# Patient Record
Sex: Female | Born: 1940
Health system: Southern US, Community
[De-identification: ages and names within clinical notes are randomized; demographics above are authoritative.]

## PROBLEM LIST (undated history)

## (undated) DIAGNOSIS — I495 Sick sinus syndrome: Secondary | ICD-10-CM

## (undated) DIAGNOSIS — G5139 Clonic hemifacial spasm, unspecified: Secondary | ICD-10-CM

## (undated) DIAGNOSIS — I471 Supraventricular tachycardia: Secondary | ICD-10-CM

## (undated) DIAGNOSIS — F32A Depression, unspecified: Secondary | ICD-10-CM

## (undated) DIAGNOSIS — F329 Major depressive disorder, single episode, unspecified: Secondary | ICD-10-CM

## (undated) DIAGNOSIS — I10 Essential (primary) hypertension: Secondary | ICD-10-CM

## (undated) DIAGNOSIS — F419 Anxiety disorder, unspecified: Secondary | ICD-10-CM

## (undated) DIAGNOSIS — I4719 Other supraventricular tachycardia: Secondary | ICD-10-CM

## (undated) DIAGNOSIS — I4891 Unspecified atrial fibrillation: Secondary | ICD-10-CM

## (undated) HISTORY — PX: CATARACT EXTRACTION: SUR2

## (undated) HISTORY — DX: Major depressive disorder, single episode, unspecified: F32.9

## (undated) HISTORY — PX: TONSILLECTOMY AND ADENOIDECTOMY: SHX28

## (undated) HISTORY — DX: Unspecified atrial fibrillation: I48.91

## (undated) HISTORY — PX: DILATION AND CURETTAGE OF UTERUS: SHX78

## (undated) HISTORY — PX: APPENDECTOMY: SHX54

## (undated) HISTORY — DX: Anxiety disorder, unspecified: F41.9

## (undated) HISTORY — DX: Supraventricular tachycardia: I47.1

## (undated) HISTORY — DX: Essential (primary) hypertension: I10

## (undated) HISTORY — DX: Other supraventricular tachycardia: I47.19

## (undated) HISTORY — DX: Clonic hemifacial spasm, unspecified: G51.39

## (undated) HISTORY — DX: Depression, unspecified: F32.A

## (undated) HISTORY — DX: Sick sinus syndrome: I49.5

---

## 1998-01-04 ENCOUNTER — Other Ambulatory Visit: Admission: RE | Admit: 1998-01-04 | Discharge: 1998-01-04 | Payer: Self-pay | Admitting: Obstetrics and Gynecology

## 1999-02-25 ENCOUNTER — Other Ambulatory Visit: Admission: RE | Admit: 1999-02-25 | Discharge: 1999-02-25 | Payer: Self-pay | Admitting: Obstetrics and Gynecology

## 1999-09-14 ENCOUNTER — Emergency Department (HOSPITAL_COMMUNITY): Admission: EM | Admit: 1999-09-14 | Discharge: 1999-09-14 | Payer: Self-pay | Admitting: Emergency Medicine

## 1999-09-14 ENCOUNTER — Encounter: Payer: Self-pay | Admitting: Emergency Medicine

## 2000-03-25 ENCOUNTER — Other Ambulatory Visit: Admission: RE | Admit: 2000-03-25 | Discharge: 2000-03-25 | Payer: Self-pay | Admitting: Obstetrics and Gynecology

## 2000-04-20 ENCOUNTER — Encounter: Payer: Self-pay | Admitting: Geriatric Medicine

## 2000-04-20 ENCOUNTER — Encounter: Admission: RE | Admit: 2000-04-20 | Discharge: 2000-04-20 | Payer: Self-pay | Admitting: Geriatric Medicine

## 2000-04-22 ENCOUNTER — Encounter: Admission: RE | Admit: 2000-04-22 | Discharge: 2000-04-22 | Payer: Self-pay | Admitting: Geriatric Medicine

## 2000-04-22 ENCOUNTER — Encounter: Payer: Self-pay | Admitting: Geriatric Medicine

## 2001-06-02 ENCOUNTER — Other Ambulatory Visit: Admission: RE | Admit: 2001-06-02 | Discharge: 2001-06-02 | Payer: Self-pay | Admitting: Obstetrics and Gynecology

## 2001-06-17 ENCOUNTER — Encounter: Payer: Self-pay | Admitting: Geriatric Medicine

## 2001-06-17 ENCOUNTER — Encounter: Admission: RE | Admit: 2001-06-17 | Discharge: 2001-06-17 | Payer: Self-pay | Admitting: Geriatric Medicine

## 2002-09-11 ENCOUNTER — Encounter: Payer: Self-pay | Admitting: *Deleted

## 2002-09-11 ENCOUNTER — Inpatient Hospital Stay (HOSPITAL_COMMUNITY): Admission: EM | Admit: 2002-09-11 | Discharge: 2002-09-12 | Payer: Self-pay | Admitting: *Deleted

## 2002-09-12 ENCOUNTER — Encounter (INDEPENDENT_AMBULATORY_CARE_PROVIDER_SITE_OTHER): Payer: Self-pay | Admitting: Cardiology

## 2003-10-04 ENCOUNTER — Encounter: Admission: RE | Admit: 2003-10-04 | Discharge: 2003-10-04 | Payer: Self-pay | Admitting: Geriatric Medicine

## 2004-02-27 ENCOUNTER — Other Ambulatory Visit: Admission: RE | Admit: 2004-02-27 | Discharge: 2004-02-27 | Payer: Self-pay | Admitting: Obstetrics and Gynecology

## 2004-02-28 ENCOUNTER — Encounter: Admission: RE | Admit: 2004-02-28 | Discharge: 2004-02-28 | Payer: Self-pay | Admitting: Obstetrics and Gynecology

## 2004-03-05 ENCOUNTER — Encounter: Admission: RE | Admit: 2004-03-05 | Discharge: 2004-03-05 | Payer: Self-pay | Admitting: Gastroenterology

## 2005-05-08 ENCOUNTER — Encounter: Admission: RE | Admit: 2005-05-08 | Discharge: 2005-05-08 | Payer: Self-pay | Admitting: Geriatric Medicine

## 2006-11-14 ENCOUNTER — Encounter: Admission: RE | Admit: 2006-11-14 | Discharge: 2006-11-14 | Payer: Self-pay | Admitting: Geriatric Medicine

## 2006-11-23 ENCOUNTER — Encounter: Admission: RE | Admit: 2006-11-23 | Discharge: 2006-11-23 | Payer: Self-pay | Admitting: Geriatric Medicine

## 2007-01-06 ENCOUNTER — Ambulatory Visit (HOSPITAL_COMMUNITY): Admission: RE | Admit: 2007-01-06 | Discharge: 2007-01-06 | Payer: Self-pay | Admitting: Geriatric Medicine

## 2007-01-06 ENCOUNTER — Encounter (INDEPENDENT_AMBULATORY_CARE_PROVIDER_SITE_OTHER): Payer: Self-pay | Admitting: Geriatric Medicine

## 2007-01-17 ENCOUNTER — Emergency Department (HOSPITAL_COMMUNITY): Admission: EM | Admit: 2007-01-17 | Discharge: 2007-01-17 | Payer: Self-pay | Admitting: Emergency Medicine

## 2007-01-20 ENCOUNTER — Encounter (HOSPITAL_COMMUNITY): Admission: RE | Admit: 2007-01-20 | Discharge: 2007-04-13 | Payer: Self-pay | Admitting: Emergency Medicine

## 2007-01-24 ENCOUNTER — Emergency Department (HOSPITAL_COMMUNITY): Admission: EM | Admit: 2007-01-24 | Discharge: 2007-01-24 | Payer: Self-pay | Admitting: *Deleted

## 2008-06-13 ENCOUNTER — Other Ambulatory Visit: Admission: RE | Admit: 2008-06-13 | Discharge: 2008-06-13 | Payer: Self-pay | Admitting: Geriatric Medicine

## 2009-09-24 ENCOUNTER — Encounter: Admission: RE | Admit: 2009-09-24 | Discharge: 2009-09-24 | Payer: Self-pay | Admitting: Geriatric Medicine

## 2009-10-01 ENCOUNTER — Encounter: Admission: RE | Admit: 2009-10-01 | Discharge: 2009-10-01 | Payer: Self-pay | Admitting: Geriatric Medicine

## 2010-06-21 ENCOUNTER — Ambulatory Visit: Payer: Self-pay | Admitting: Surgery

## 2010-06-21 ENCOUNTER — Encounter (INDEPENDENT_AMBULATORY_CARE_PROVIDER_SITE_OTHER): Payer: Self-pay | Admitting: Internal Medicine

## 2010-06-21 ENCOUNTER — Ambulatory Visit (HOSPITAL_COMMUNITY): Admission: RE | Admit: 2010-06-21 | Discharge: 2010-06-21 | Payer: Self-pay | Admitting: Internal Medicine

## 2010-07-11 ENCOUNTER — Encounter: Admission: RE | Admit: 2010-07-11 | Discharge: 2010-07-11 | Payer: Self-pay | Admitting: Geriatric Medicine

## 2010-09-01 ENCOUNTER — Encounter: Payer: Self-pay | Admitting: Geriatric Medicine

## 2011-08-25 ENCOUNTER — Other Ambulatory Visit: Payer: Self-pay | Admitting: Geriatric Medicine

## 2011-08-25 DIAGNOSIS — R1013 Epigastric pain: Secondary | ICD-10-CM | POA: Diagnosis not present

## 2011-08-25 DIAGNOSIS — I10 Essential (primary) hypertension: Secondary | ICD-10-CM | POA: Diagnosis not present

## 2011-08-25 DIAGNOSIS — Z79899 Other long term (current) drug therapy: Secondary | ICD-10-CM | POA: Diagnosis not present

## 2011-08-27 ENCOUNTER — Ambulatory Visit
Admission: RE | Admit: 2011-08-27 | Discharge: 2011-08-27 | Disposition: A | Discharge status: Home / Self Care | Payer: Medicare Other | Source: Ambulatory Visit | Attending: Geriatric Medicine | Admitting: Geriatric Medicine

## 2011-08-27 DIAGNOSIS — R1013 Epigastric pain: Secondary | ICD-10-CM

## 2011-08-27 DIAGNOSIS — Z800 Family history of malignant neoplasm of digestive organs: Secondary | ICD-10-CM | POA: Diagnosis not present

## 2011-09-29 DIAGNOSIS — J329 Chronic sinusitis, unspecified: Secondary | ICD-10-CM | POA: Diagnosis not present

## 2011-12-02 DIAGNOSIS — I499 Cardiac arrhythmia, unspecified: Secondary | ICD-10-CM | POA: Diagnosis not present

## 2011-12-02 DIAGNOSIS — R9431 Abnormal electrocardiogram [ECG] [EKG]: Secondary | ICD-10-CM | POA: Diagnosis not present

## 2011-12-02 DIAGNOSIS — I471 Supraventricular tachycardia: Secondary | ICD-10-CM | POA: Diagnosis not present

## 2011-12-02 DIAGNOSIS — Z79899 Other long term (current) drug therapy: Secondary | ICD-10-CM | POA: Diagnosis not present

## 2011-12-02 DIAGNOSIS — Z09 Encounter for follow-up examination after completed treatment for conditions other than malignant neoplasm: Secondary | ICD-10-CM | POA: Diagnosis not present

## 2011-12-02 DIAGNOSIS — I498 Other specified cardiac arrhythmias: Secondary | ICD-10-CM | POA: Diagnosis not present

## 2011-12-02 DIAGNOSIS — I4891 Unspecified atrial fibrillation: Secondary | ICD-10-CM | POA: Diagnosis not present

## 2011-12-02 DIAGNOSIS — Z7982 Long term (current) use of aspirin: Secondary | ICD-10-CM | POA: Diagnosis not present

## 2011-12-12 ENCOUNTER — Other Ambulatory Visit: Payer: Self-pay | Admitting: Geriatric Medicine

## 2011-12-12 DIAGNOSIS — K591 Functional diarrhea: Secondary | ICD-10-CM | POA: Diagnosis not present

## 2011-12-12 DIAGNOSIS — Z1211 Encounter for screening for malignant neoplasm of colon: Secondary | ICD-10-CM

## 2011-12-18 ENCOUNTER — Other Ambulatory Visit: Payer: Medicare Other

## 2011-12-19 ENCOUNTER — Ambulatory Visit
Admission: RE | Admit: 2011-12-19 | Discharge: 2011-12-19 | Disposition: A | Discharge status: Home / Self Care | Payer: Medicare Other | Source: Ambulatory Visit | Attending: Geriatric Medicine | Admitting: Geriatric Medicine

## 2011-12-19 DIAGNOSIS — Z1211 Encounter for screening for malignant neoplasm of colon: Secondary | ICD-10-CM

## 2012-03-15 ENCOUNTER — Other Ambulatory Visit (HOSPITAL_COMMUNITY)
Admission: RE | Admit: 2012-03-15 | Discharge: 2012-03-15 | Disposition: A | Discharge status: Home / Self Care | Payer: Medicare Other | Source: Ambulatory Visit | Attending: Geriatric Medicine | Admitting: Geriatric Medicine

## 2012-03-15 ENCOUNTER — Other Ambulatory Visit: Payer: Self-pay | Admitting: Geriatric Medicine

## 2012-03-15 DIAGNOSIS — Z79899 Other long term (current) drug therapy: Secondary | ICD-10-CM | POA: Diagnosis not present

## 2012-03-15 DIAGNOSIS — Z1331 Encounter for screening for depression: Secondary | ICD-10-CM | POA: Diagnosis not present

## 2012-03-15 DIAGNOSIS — Z124 Encounter for screening for malignant neoplasm of cervix: Secondary | ICD-10-CM | POA: Insufficient documentation

## 2012-03-15 DIAGNOSIS — Z0000 Encounter for general adult medical examination without abnormal findings: Secondary | ICD-10-CM | POA: Diagnosis not present

## 2012-03-23 DIAGNOSIS — H40019 Open angle with borderline findings, low risk, unspecified eye: Secondary | ICD-10-CM | POA: Diagnosis not present

## 2012-03-23 DIAGNOSIS — H04129 Dry eye syndrome of unspecified lacrimal gland: Secondary | ICD-10-CM | POA: Diagnosis not present

## 2012-03-23 DIAGNOSIS — H2510 Age-related nuclear cataract, unspecified eye: Secondary | ICD-10-CM | POA: Diagnosis not present

## 2012-06-24 DIAGNOSIS — L259 Unspecified contact dermatitis, unspecified cause: Secondary | ICD-10-CM | POA: Diagnosis not present

## 2012-09-14 DIAGNOSIS — F411 Generalized anxiety disorder: Secondary | ICD-10-CM | POA: Diagnosis not present

## 2012-09-14 DIAGNOSIS — I10 Essential (primary) hypertension: Secondary | ICD-10-CM | POA: Diagnosis not present

## 2012-09-14 DIAGNOSIS — R209 Unspecified disturbances of skin sensation: Secondary | ICD-10-CM | POA: Diagnosis not present

## 2012-11-30 DIAGNOSIS — I498 Other specified cardiac arrhythmias: Secondary | ICD-10-CM | POA: Diagnosis not present

## 2012-11-30 DIAGNOSIS — I4891 Unspecified atrial fibrillation: Secondary | ICD-10-CM | POA: Diagnosis not present

## 2012-11-30 DIAGNOSIS — I499 Cardiac arrhythmia, unspecified: Secondary | ICD-10-CM | POA: Diagnosis not present

## 2012-12-31 DIAGNOSIS — R002 Palpitations: Secondary | ICD-10-CM | POA: Diagnosis not present

## 2013-01-02 DIAGNOSIS — I4949 Other premature depolarization: Secondary | ICD-10-CM | POA: Diagnosis not present

## 2013-01-02 DIAGNOSIS — R002 Palpitations: Secondary | ICD-10-CM | POA: Diagnosis not present

## 2013-01-10 DIAGNOSIS — I471 Supraventricular tachycardia: Secondary | ICD-10-CM | POA: Diagnosis not present

## 2013-01-10 DIAGNOSIS — I4949 Other premature depolarization: Secondary | ICD-10-CM | POA: Diagnosis not present

## 2013-01-10 DIAGNOSIS — R002 Palpitations: Secondary | ICD-10-CM | POA: Diagnosis not present

## 2013-01-10 DIAGNOSIS — I4891 Unspecified atrial fibrillation: Secondary | ICD-10-CM | POA: Diagnosis not present

## 2013-02-25 DIAGNOSIS — G245 Blepharospasm: Secondary | ICD-10-CM | POA: Diagnosis not present

## 2013-02-28 ENCOUNTER — Other Ambulatory Visit: Payer: Self-pay | Admitting: Geriatric Medicine

## 2013-02-28 DIAGNOSIS — G518 Other disorders of facial nerve: Secondary | ICD-10-CM

## 2013-03-01 DIAGNOSIS — G518 Other disorders of facial nerve: Secondary | ICD-10-CM | POA: Diagnosis not present

## 2013-03-03 ENCOUNTER — Other Ambulatory Visit: Payer: Medicare Other

## 2013-03-04 ENCOUNTER — Ambulatory Visit
Admission: RE | Admit: 2013-03-04 | Discharge: 2013-03-04 | Disposition: A | Discharge status: Home / Self Care | Payer: Medicare Other | Source: Ambulatory Visit | Attending: Geriatric Medicine | Admitting: Geriatric Medicine

## 2013-03-04 DIAGNOSIS — G518 Other disorders of facial nerve: Secondary | ICD-10-CM | POA: Diagnosis not present

## 2013-03-04 MED ORDER — GADOBENATE DIMEGLUMINE 529 MG/ML IV SOLN
15.0000 mL | Freq: Once | INTRAVENOUS | Status: AC | PRN
  Administered 2013-03-04: 15 mL via INTRAVENOUS

## 2013-03-29 DIAGNOSIS — H04129 Dry eye syndrome of unspecified lacrimal gland: Secondary | ICD-10-CM | POA: Diagnosis not present

## 2013-03-29 DIAGNOSIS — H40019 Open angle with borderline findings, low risk, unspecified eye: Secondary | ICD-10-CM | POA: Diagnosis not present

## 2013-03-29 DIAGNOSIS — H2510 Age-related nuclear cataract, unspecified eye: Secondary | ICD-10-CM | POA: Diagnosis not present

## 2013-04-01 DIAGNOSIS — R209 Unspecified disturbances of skin sensation: Secondary | ICD-10-CM | POA: Diagnosis not present

## 2013-04-01 DIAGNOSIS — Z79899 Other long term (current) drug therapy: Secondary | ICD-10-CM | POA: Diagnosis not present

## 2013-04-01 DIAGNOSIS — G518 Other disorders of facial nerve: Secondary | ICD-10-CM | POA: Diagnosis not present

## 2013-04-01 DIAGNOSIS — Z0000 Encounter for general adult medical examination without abnormal findings: Secondary | ICD-10-CM | POA: Diagnosis not present

## 2013-04-01 DIAGNOSIS — Z1331 Encounter for screening for depression: Secondary | ICD-10-CM | POA: Diagnosis not present

## 2013-04-27 DIAGNOSIS — R002 Palpitations: Secondary | ICD-10-CM | POA: Diagnosis not present

## 2013-04-27 DIAGNOSIS — I4891 Unspecified atrial fibrillation: Secondary | ICD-10-CM | POA: Diagnosis not present

## 2013-04-27 DIAGNOSIS — I4949 Other premature depolarization: Secondary | ICD-10-CM | POA: Diagnosis not present

## 2013-04-27 DIAGNOSIS — I471 Supraventricular tachycardia: Secondary | ICD-10-CM | POA: Diagnosis not present

## 2013-06-27 DIAGNOSIS — N390 Urinary tract infection, site not specified: Secondary | ICD-10-CM | POA: Diagnosis not present

## 2013-07-11 DIAGNOSIS — N390 Urinary tract infection, site not specified: Secondary | ICD-10-CM | POA: Diagnosis not present

## 2013-08-22 ENCOUNTER — Other Ambulatory Visit: Payer: Self-pay | Admitting: Internal Medicine

## 2013-08-22 ENCOUNTER — Ambulatory Visit
Admission: RE | Admit: 2013-08-22 | Discharge: 2013-08-22 | Disposition: A | Discharge status: Home / Self Care | Payer: Medicare Other | Source: Ambulatory Visit | Attending: Internal Medicine | Admitting: Internal Medicine

## 2013-08-22 DIAGNOSIS — IMO0002 Reserved for concepts with insufficient information to code with codable children: Secondary | ICD-10-CM | POA: Diagnosis not present

## 2013-08-22 DIAGNOSIS — M5410 Radiculopathy, site unspecified: Secondary | ICD-10-CM

## 2013-08-22 DIAGNOSIS — Z23 Encounter for immunization: Secondary | ICD-10-CM | POA: Diagnosis not present

## 2013-08-22 DIAGNOSIS — M47817 Spondylosis without myelopathy or radiculopathy, lumbosacral region: Secondary | ICD-10-CM | POA: Diagnosis not present

## 2013-08-22 DIAGNOSIS — M4310 Spondylolisthesis, site unspecified: Secondary | ICD-10-CM | POA: Diagnosis not present

## 2013-08-26 DIAGNOSIS — R6884 Jaw pain: Secondary | ICD-10-CM | POA: Diagnosis not present

## 2013-08-26 DIAGNOSIS — K219 Gastro-esophageal reflux disease without esophagitis: Secondary | ICD-10-CM | POA: Diagnosis not present

## 2013-08-31 DIAGNOSIS — IMO0002 Reserved for concepts with insufficient information to code with codable children: Secondary | ICD-10-CM | POA: Diagnosis not present

## 2013-09-09 DIAGNOSIS — R6884 Jaw pain: Secondary | ICD-10-CM | POA: Diagnosis not present

## 2013-09-09 DIAGNOSIS — I10 Essential (primary) hypertension: Secondary | ICD-10-CM | POA: Diagnosis not present

## 2013-09-12 DIAGNOSIS — IMO0002 Reserved for concepts with insufficient information to code with codable children: Secondary | ICD-10-CM | POA: Diagnosis not present

## 2013-09-19 DIAGNOSIS — IMO0002 Reserved for concepts with insufficient information to code with codable children: Secondary | ICD-10-CM | POA: Diagnosis not present

## 2013-09-26 DIAGNOSIS — IMO0002 Reserved for concepts with insufficient information to code with codable children: Secondary | ICD-10-CM | POA: Diagnosis not present

## 2013-10-03 DIAGNOSIS — IMO0002 Reserved for concepts with insufficient information to code with codable children: Secondary | ICD-10-CM | POA: Diagnosis not present

## 2013-10-10 DIAGNOSIS — IMO0002 Reserved for concepts with insufficient information to code with codable children: Secondary | ICD-10-CM | POA: Diagnosis not present

## 2013-10-17 DIAGNOSIS — IMO0002 Reserved for concepts with insufficient information to code with codable children: Secondary | ICD-10-CM | POA: Diagnosis not present

## 2013-10-24 DIAGNOSIS — IMO0002 Reserved for concepts with insufficient information to code with codable children: Secondary | ICD-10-CM | POA: Diagnosis not present

## 2013-10-31 DIAGNOSIS — IMO0002 Reserved for concepts with insufficient information to code with codable children: Secondary | ICD-10-CM | POA: Diagnosis not present

## 2013-11-07 DIAGNOSIS — IMO0002 Reserved for concepts with insufficient information to code with codable children: Secondary | ICD-10-CM | POA: Diagnosis not present

## 2013-11-14 DIAGNOSIS — IMO0002 Reserved for concepts with insufficient information to code with codable children: Secondary | ICD-10-CM | POA: Diagnosis not present

## 2013-11-21 DIAGNOSIS — IMO0002 Reserved for concepts with insufficient information to code with codable children: Secondary | ICD-10-CM | POA: Diagnosis not present

## 2013-11-28 DIAGNOSIS — IMO0002 Reserved for concepts with insufficient information to code with codable children: Secondary | ICD-10-CM | POA: Diagnosis not present

## 2013-12-05 DIAGNOSIS — I471 Supraventricular tachycardia: Secondary | ICD-10-CM | POA: Diagnosis not present

## 2013-12-05 DIAGNOSIS — I4949 Other premature depolarization: Secondary | ICD-10-CM | POA: Diagnosis not present

## 2013-12-05 DIAGNOSIS — I4891 Unspecified atrial fibrillation: Secondary | ICD-10-CM | POA: Diagnosis not present

## 2013-12-05 DIAGNOSIS — R002 Palpitations: Secondary | ICD-10-CM | POA: Diagnosis not present

## 2013-12-12 DIAGNOSIS — IMO0002 Reserved for concepts with insufficient information to code with codable children: Secondary | ICD-10-CM | POA: Diagnosis not present

## 2014-01-31 DIAGNOSIS — R319 Hematuria, unspecified: Secondary | ICD-10-CM | POA: Diagnosis not present

## 2014-02-14 DIAGNOSIS — N390 Urinary tract infection, site not specified: Secondary | ICD-10-CM | POA: Diagnosis not present

## 2014-02-24 DIAGNOSIS — I10 Essential (primary) hypertension: Secondary | ICD-10-CM | POA: Diagnosis not present

## 2014-02-24 DIAGNOSIS — I498 Other specified cardiac arrhythmias: Secondary | ICD-10-CM | POA: Diagnosis not present

## 2014-02-24 DIAGNOSIS — Z23 Encounter for immunization: Secondary | ICD-10-CM | POA: Diagnosis not present

## 2014-04-10 ENCOUNTER — Other Ambulatory Visit: Payer: Self-pay | Admitting: Geriatric Medicine

## 2014-04-10 DIAGNOSIS — Z1331 Encounter for screening for depression: Secondary | ICD-10-CM | POA: Diagnosis not present

## 2014-04-10 DIAGNOSIS — Z0000 Encounter for general adult medical examination without abnormal findings: Secondary | ICD-10-CM | POA: Diagnosis not present

## 2014-04-10 DIAGNOSIS — I10 Essential (primary) hypertension: Secondary | ICD-10-CM | POA: Diagnosis not present

## 2014-04-10 DIAGNOSIS — Z79899 Other long term (current) drug therapy: Secondary | ICD-10-CM | POA: Diagnosis not present

## 2014-04-10 DIAGNOSIS — Q619 Cystic kidney disease, unspecified: Secondary | ICD-10-CM | POA: Diagnosis not present

## 2014-04-10 DIAGNOSIS — N281 Cyst of kidney, acquired: Secondary | ICD-10-CM

## 2014-04-10 DIAGNOSIS — R109 Unspecified abdominal pain: Secondary | ICD-10-CM | POA: Diagnosis not present

## 2014-04-11 ENCOUNTER — Ambulatory Visit
Admission: RE | Admit: 2014-04-11 | Discharge: 2014-04-11 | Disposition: A | Discharge status: Home / Self Care | Payer: Medicare Other | Source: Ambulatory Visit | Attending: Geriatric Medicine | Admitting: Geriatric Medicine

## 2014-04-11 DIAGNOSIS — N281 Cyst of kidney, acquired: Secondary | ICD-10-CM

## 2014-06-07 DIAGNOSIS — H40013 Open angle with borderline findings, low risk, bilateral: Secondary | ICD-10-CM | POA: Diagnosis not present

## 2014-06-07 DIAGNOSIS — H04123 Dry eye syndrome of bilateral lacrimal glands: Secondary | ICD-10-CM | POA: Diagnosis not present

## 2014-06-07 DIAGNOSIS — H2513 Age-related nuclear cataract, bilateral: Secondary | ICD-10-CM | POA: Diagnosis not present

## 2014-06-20 DIAGNOSIS — R300 Dysuria: Secondary | ICD-10-CM | POA: Diagnosis not present

## 2014-07-05 DIAGNOSIS — N390 Urinary tract infection, site not specified: Secondary | ICD-10-CM | POA: Diagnosis not present

## 2014-07-21 DIAGNOSIS — Z23 Encounter for immunization: Secondary | ICD-10-CM | POA: Diagnosis not present

## 2014-07-21 DIAGNOSIS — R300 Dysuria: Secondary | ICD-10-CM | POA: Diagnosis not present

## 2014-07-24 DIAGNOSIS — I10 Essential (primary) hypertension: Secondary | ICD-10-CM | POA: Diagnosis not present

## 2014-07-24 DIAGNOSIS — I471 Supraventricular tachycardia: Secondary | ICD-10-CM | POA: Diagnosis not present

## 2014-07-24 DIAGNOSIS — I4891 Unspecified atrial fibrillation: Secondary | ICD-10-CM | POA: Diagnosis not present

## 2014-08-25 ENCOUNTER — Encounter: Payer: Self-pay | Admitting: Obstetrics and Gynecology

## 2014-08-25 ENCOUNTER — Other Ambulatory Visit: Payer: Self-pay

## 2014-08-25 ENCOUNTER — Ambulatory Visit (INDEPENDENT_AMBULATORY_CARE_PROVIDER_SITE_OTHER): Payer: Medicare Other | Admitting: Obstetrics and Gynecology

## 2014-08-25 VITALS — BP 140/92 | HR 60 | Resp 16 | Ht 65.0 in | Wt 166.4 lb

## 2014-08-25 DIAGNOSIS — N390 Urinary tract infection, site not specified: Secondary | ICD-10-CM

## 2014-08-25 DIAGNOSIS — N9489 Other specified conditions associated with female genital organs and menstrual cycle: Secondary | ICD-10-CM

## 2014-08-25 DIAGNOSIS — R102 Pelvic and perineal pain: Secondary | ICD-10-CM

## 2014-08-25 DIAGNOSIS — Z0000 Encounter for general adult medical examination without abnormal findings: Secondary | ICD-10-CM | POA: Diagnosis not present

## 2014-08-25 DIAGNOSIS — Z1231 Encounter for screening mammogram for malignant neoplasm of breast: Secondary | ICD-10-CM

## 2014-08-25 LAB — POCT URINALYSIS DIPSTICK
Bilirubin, UA: NEGATIVE
Blood, UA: NEGATIVE
GLUCOSE UA: NEGATIVE
Ketones, UA: NEGATIVE
LEUKOCYTES UA: NEGATIVE
NITRITE UA: NEGATIVE
PH UA: 5
PROTEIN UA: NEGATIVE
Urobilinogen, UA: NEGATIVE

## 2014-08-25 NOTE — Progress Notes (Signed)
Patient ID: Katherine Cardenas, female   DOB: [birth year], 74 y.o.   MRN: [id number] GYNECOLOGY VISIT  PCP: Lajean Manes, MD  Referring provider:   HPI: 74 y.o.   Married  Caucasian  female   [phone] with Patient's last menstrual period was 08/11/1988 (approximate).   here for frequent Urinary tract infections--3 in past year. Last prior UTI was with pregnancy.  Infections have not followed sexual activity.  Had a UTI treated with Doxycycline in December and did not have a test of cure.   Patient also complaint of "fullness" in vagina area.   Was a cross between pain and sexual activity.  This felt like a chronic stimulation like an orgasm that was lasting for a long period of time.  Sensation is better now.  Leaks when has a sneezing spell. No leak with cough, laugh.  Needs to double void to empty well.  Cannot tell when her bladder is empty.  DF - every 3 - 4 hours.  NF - every 3 - 4 hours.  No enuresis.  Takes Avapro at night.   Today, no hematuria or dysuria.  Had history of hemorrhagic cystitis.  No history of renal stones.  History of left renal cyst noted on ultrasound 5.7 cm, also seen on previous CT scan and ultrasound. Has some left upper abdominal discomfort just below costal margin.  Has a sharp angulation to her colon so was guided to have a virtual colonoscopy.   Urine:  Neg  GYNECOLOGIC HISTORY: Patient's last menstrual period was 08/11/1988 (approximate). Sexually active:  no Partner preference: female Contraception:  postmenopausal  Menopausal hormone therapy: n/a DES exposure:   no Blood transfusions:  no  Sexually transmitted diseases:   no GYN procedures and prior surgeries:  no Last mammogram:  Years ago               Last pap and high risk HPV testing:  03-15-12 OIN:OMVEH HPV  History of abnormal pap smear:  Yes, Hx cryotherapy to cervix in her 40's.  Paps normal since.   OB History    Gravida Para Term Preterm AB TAB SAB Ectopic Multiple Living   '3  3 3       3       '$ LIFESTYLE: Exercise:    Walking and Tai Chi             Tobacco:    No Alcohol:     2 glasses of wine per night Drug use:  no  There are no active problems to display for this patient.   Past Medical History  Diagnosis Date  . Anxiety   . Depression   . Heart murmur     functional-normal echocardiogram  . Hypertension   . Atrial tachycardia     --sees cardiologist--Dr. Adrian Prows  . Atrial fibrillation   . Facial spasm     Past Surgical History  Procedure Laterality Date  . Tonsillectomy and adenoidectomy      --age 79  . Appendectomy      age 1  . Dilation and curettage of uterus  1990's    benign polyp    Current Outpatient Prescriptions  Medication Sig Dispense Refill  . Ascorbic Acid (VITAMIN C) 1000 MG tablet Take 1,000 mg by mouth daily.    [name] aspirin 325 MG tablet Take 325 mg by mouth daily.    . Fish Oil-Cholecalciferol (FISH OIL + D3) 1000-1000 MG-UNIT CAPS Take 1,000 mg by mouth daily.    [name]  Magnesium 250 MG TABS Take 250 mg by mouth daily.    . Vitamin D, Cholecalciferol, 1000 UNITS TABS Take 1,000 Units by mouth daily.    . irbesartan (AVAPRO) 75 MG tablet Take 75 mg by mouth daily.  12  . LORazepam (ATIVAN) 1 MG tablet Take 1 mg by mouth 2 (two) times daily as needed.  3  . propafenone (RYTHMOL) 150 MG tablet Take 150 mg by mouth 3 (three) times daily.  11   No current facility-administered medications for this visit.     ALLERGIES: Augmentin; Ciprofloxacin; Iohexol; Other; and Penicillins  Family History  Problem Relation Age of Onset  . Cancer Father 70    dec-stomach ca  . Hypertension Mother   . Renal Disease Mother 70    dec-renal failure  . Hypertension Maternal Grandmother   . Heart attack Maternal Grandmother     History   Social History  . Marital Status: Married    Spouse Name: N/A    Number of Children: N/A  . Years of Education: N/A   Occupational History  . Not on file.   Social History Main  Topics  . Smoking status: Never Smoker   . Smokeless tobacco: Not on file  . Alcohol Use: 8.4 oz/week    14 Glasses of wine per week  . Drug Use: No  . Sexual Activity: No   Other Topics Concern  . Not on file   Social History Narrative  . No narrative on file    ROS:  Pertinent items are noted in HPI.  PHYSICAL EXAMINATION:    BP 140/92 mmHg  Pulse 60  Resp 16  Ht $R'5\' 5"'IB$  (1.651 m)  Wt 166 lb 6.4 oz (75.479 kg)  BMI 27.69 kg/m2  LMP 08/11/1988 (Approximate)   Wt Readings from Last 3 Encounters:  08/25/14 166 lb 6.4 oz (75.479 kg)     Ht Readings from Last 3 Encounters:  08/25/14 $RemoveB'5\' 5"'vnYzxZJB$  (1.651 m)    General appearance: alert, cooperative and appears stated age Lungs: clear to auscultation bilaterally Heart: regular rate and rhythm Abdomen: soft, non-tender; no masses,  no organomegaly No abnormal inguinal nodes palpated   Pelvic: External genitalia:  no lesions              Urethra:  normal appearing urethra with no masses, tenderness or lesions              Bartholins and Skenes: normal                 Vagina: normal appearing vagina with normal color and discharge, no lesions              Cervix: normal appearance                 Bimanual Exam:  Uterus:  uterus is normal size, shape, consistency and nontender                                      Adnexa: normal adnexa in size, nontender and no masses                                      Rectovaginal: Confirms  Anus:  normal sphincter tone, no lesions  ASSESSMENT  Recurrent UTIs. Incomplete voiding.  Vaginal fullness/pelvic pressure. Large left renal cyst.   PLAN  Urine culture - test of cure. Return for pelvic ultrasound.  Consider checking post void residual.  Consider referral to urology if work up above is normal.   An After Visit Summary was printed and given to the patient.  45 minutes face to face time of which over 50% was spent in counseling.

## 2014-08-25 NOTE — Patient Instructions (Signed)
[facility]

## 2014-08-27 LAB — URINE CULTURE
COLONY COUNT: NO GROWTH
Organism ID, Bacteria: NO GROWTH

## 2014-08-29 ENCOUNTER — Ambulatory Visit
Admission: RE | Admit: 2014-08-29 | Discharge: 2014-08-29 | Disposition: A | Discharge status: Home / Self Care | Payer: Medicare Other | Source: Ambulatory Visit

## 2014-08-29 DIAGNOSIS — Z1231 Encounter for screening mammogram for malignant neoplasm of breast: Secondary | ICD-10-CM

## 2014-09-04 ENCOUNTER — Telehealth: Payer: Self-pay | Admitting: Obstetrics and Gynecology

## 2014-09-04 NOTE — Telephone Encounter (Signed)
Spoke with patient. Advised that per benefit quote received, she will have 0 liability when she comes in for PUS.  Patient agreeable. Scheduled PUS. Advised patient of 72 hour cancellation policy and $628 cancellation fee. Patient agreeable.

## 2014-09-08 ENCOUNTER — Other Ambulatory Visit: Payer: Self-pay | Admitting: Geriatric Medicine

## 2014-09-08 DIAGNOSIS — R1084 Generalized abdominal pain: Secondary | ICD-10-CM

## 2014-09-08 DIAGNOSIS — R109 Unspecified abdominal pain: Secondary | ICD-10-CM | POA: Diagnosis not present

## 2014-09-11 ENCOUNTER — Other Ambulatory Visit: Payer: Medicare Other

## 2014-09-12 ENCOUNTER — Ambulatory Visit
Admission: RE | Admit: 2014-09-12 | Discharge: 2014-09-12 | Disposition: A | Discharge status: Home / Self Care | Payer: Medicare Other | Source: Ambulatory Visit | Attending: Geriatric Medicine | Admitting: Geriatric Medicine

## 2014-09-12 DIAGNOSIS — R1084 Generalized abdominal pain: Secondary | ICD-10-CM | POA: Diagnosis not present

## 2014-09-12 DIAGNOSIS — N281 Cyst of kidney, acquired: Secondary | ICD-10-CM | POA: Diagnosis not present

## 2014-09-13 ENCOUNTER — Ambulatory Visit
Admission: RE | Admit: 2014-09-13 | Discharge: 2014-09-13 | Disposition: A | Discharge status: Home / Self Care | Payer: Medicare Other | Source: Ambulatory Visit | Attending: Geriatric Medicine | Admitting: Geriatric Medicine

## 2014-09-13 DIAGNOSIS — R1084 Generalized abdominal pain: Secondary | ICD-10-CM

## 2014-09-13 DIAGNOSIS — R109 Unspecified abdominal pain: Secondary | ICD-10-CM | POA: Diagnosis not present

## 2014-09-18 ENCOUNTER — Other Ambulatory Visit: Payer: Medicare Other

## 2014-09-28 ENCOUNTER — Encounter: Payer: Self-pay | Admitting: Obstetrics and Gynecology

## 2014-09-28 ENCOUNTER — Ambulatory Visit (INDEPENDENT_AMBULATORY_CARE_PROVIDER_SITE_OTHER): Payer: Medicare Other

## 2014-09-28 ENCOUNTER — Ambulatory Visit (INDEPENDENT_AMBULATORY_CARE_PROVIDER_SITE_OTHER): Payer: Medicare Other | Admitting: Obstetrics and Gynecology

## 2014-09-28 VITALS — BP 146/98 | Resp 16 | Ht 65.0 in | Wt 167.0 lb

## 2014-09-28 DIAGNOSIS — R102 Pelvic and perineal pain: Secondary | ICD-10-CM

## 2014-09-28 DIAGNOSIS — Z658 Other specified problems related to psychosocial circumstances: Secondary | ICD-10-CM

## 2014-09-28 DIAGNOSIS — N9489 Other specified conditions associated with female genital organs and menstrual cycle: Secondary | ICD-10-CM | POA: Diagnosis not present

## 2014-09-28 DIAGNOSIS — R938 Abnormal findings on diagnostic imaging of other specified body structures: Secondary | ICD-10-CM | POA: Diagnosis not present

## 2014-09-28 DIAGNOSIS — R9389 Abnormal findings on diagnostic imaging of other specified body structures: Secondary | ICD-10-CM

## 2014-09-28 DIAGNOSIS — F439 Reaction to severe stress, unspecified: Secondary | ICD-10-CM

## 2014-09-28 NOTE — Progress Notes (Signed)
Subjective  Patient is here today for pelvic ultrasound for pelvic pressure.   Patient also complaint of "fullness" in vagina area.  Was a cross between pain and sexual activity.  This felt like a chronic stimulation like an orgasm that was lasting for a long period of time.  Sensation is better now.  History of recurrent UTIs and renal cyst.  Emptying bladder better.   Denies vaginal bleeding.   History of dilation and curettage and possible hysteroscopy for endometrial polyp with Dr. Ardine Eng about 20 years ago.   Not sure if she is allergic to novocaine.  Had a root canal done and patient passed out after having an injection of novacaine with epinephrine.  Had skin testing following this which was inconclusive   Has further dental care using carbocaine without epinephrine and she did not have a reaction.   Patient is very concerned about her husband's and son's health.  Grandchild died 3 year ago.   Objective  Ultrasound images and report reviewed with patient.   Uterus with fibroid 1.28 mm.  EMS 6.18 mm with cystic area. Ovaries normal.  No free fluid.     Assessment  Thickened endometrium.  Uterine fibroid.  Situational stress.   Plan  Discussed uterine fibroid.  Discussed thickened endometrium and possible etiologies - polyp, hyperplasia, cancer.  Return for endometrial biopsy.  Support given.   25 minutes face to face time of which over 50% was spent in counseling.   After visit summary to patient.

## 2014-10-02 ENCOUNTER — Telehealth: Payer: Self-pay | Admitting: Obstetrics and Gynecology

## 2014-10-02 NOTE — Telephone Encounter (Signed)
Call to patient. No answer. Need to go over benefits and schedule EMB

## 2014-10-09 DIAGNOSIS — I10 Essential (primary) hypertension: Secondary | ICD-10-CM | POA: Diagnosis not present

## 2014-10-13 NOTE — Telephone Encounter (Signed)
Left message for patient to call back. Need to go over benefits and schedule EMB.

## 2014-10-13 NOTE — Telephone Encounter (Signed)
Patient returned call. I advised patient of benefit quote received for EMB. Patient agreeable.  States that she will call back to schedule because she has some things coming up with her family.

## 2014-10-15 NOTE — Telephone Encounter (Signed)
Please keep patient in the work que and call back in one month to try to schedule again.  There are some family health issues going on right now. Patient does have a good understanding of the reason for the endometrial biopsy.   Cc- [place].

## 2014-10-16 NOTE — Telephone Encounter (Signed)
Gabriel Cirri, lets place her in deferred work que till 11-10-14 and see if she has called by then. I also have her on my EPIC reminder list. Will close this encounter.

## 2014-11-14 ENCOUNTER — Telehealth: Payer: Self-pay | Admitting: Obstetrics and Gynecology

## 2014-11-14 NOTE — Telephone Encounter (Signed)
Left message for patient to call back  

## 2014-11-14 NOTE — Telephone Encounter (Signed)
Patient would like to talk to Dr Quincy Simmonds prior to scheduling. She would like to be called on her home phone [phone].

## 2014-11-14 NOTE — Telephone Encounter (Signed)
Patient says she returning a call [place].

## 2014-11-16 NOTE — Telephone Encounter (Signed)
Phone call to patient regarding endometrial biopsy.  Patient is not worried about the biopsy.  She is worried about what it can lead to, i.e. Further care through surgery.   Busy with health of family members.  EMS thickened at 6.18 mm with cystic change.  I have recommended an endometrial biopsy and explained the procedure and reasoning for this.  Patient understands that it can diagnose polyps, endometrial hyperplasia and cancer.   If the EMB is benign, she may be able to follow this with repeat ultrasound and observation for bleeding.  Patient states she will call to make the appointment and will likely do this toward the end of May.   She understands that her evaluation is not complete to date.

## 2014-11-17 NOTE — Telephone Encounter (Signed)
See next phone encounter.

## 2014-12-05 ENCOUNTER — Telehealth: Payer: Self-pay | Admitting: *Deleted

## 2014-12-05 NOTE — Telephone Encounter (Signed)
I really welcome a second opinion for the patient.  I would suggest seeing GYN Oncology at the [facility].  I really want what is safest and best for patient care.

## 2014-12-05 NOTE — Telephone Encounter (Signed)
Reviewed with Dr Quincy Simmonds. Patient has not scheduled endometrial biopsy appointment. Follow-up call to patient. She states she really didn't represent her concerns to Dr Quincy Simmonds well during her phone call. She and her husband have been ill with flu type virus for about three weeks so she has not felt well enough to do much about this. Basically, still looking into options, really prefers to avoid all invasive procedures. Her research indicates that 39mm endometrial thickness does not require biopsy when patient is asymptomatic. Ideally, she would like to remain under Dr Elza Rafter care and have follow-up in one year for reassessment.  Advised I didn't feel like follow-up in one year was realistic. We respect her right to make healthcare decisions and Dr Quincy Simmonds will decide if she is comfortable with monitoring, but perhaps for shorter time interval.  She is very respectful of Dr Elza Rafter opinion and states she is not refusing procedure indefinitely. Just not a good time right now and doesn't feel this is urgent. Patient states she feels risk of significant finding is remote. Advised that although she is comfortable assuming this risk, as her provider, we may not be able to also assume this risk. There are standards of care we have to follow but again, we respect her right to make decisions and want her to be comfortable with her choice. She wants to know if Dr Quincy Simmonds will discharge her if she declines procedure. Advised that this is a decision Dr Quincy Simmonds will have to make.   Advised Dr Quincy Simmonds will review call and I will call her back by beginning of next week with update.  Phone call 19 minutes.

## 2014-12-12 DIAGNOSIS — I10 Essential (primary) hypertension: Secondary | ICD-10-CM | POA: Diagnosis not present

## 2014-12-13 ENCOUNTER — Emergency Department (HOSPITAL_COMMUNITY): Payer: Medicare Other

## 2014-12-13 ENCOUNTER — Encounter (HOSPITAL_COMMUNITY): Payer: Self-pay | Admitting: *Deleted

## 2014-12-13 ENCOUNTER — Emergency Department (HOSPITAL_COMMUNITY)
Admission: EM | Admit: 2014-12-13 | Discharge: 2014-12-14 | Disposition: A | Discharge status: Home / Self Care | Payer: Medicare Other | Attending: Emergency Medicine | Admitting: Emergency Medicine

## 2014-12-13 DIAGNOSIS — I4891 Unspecified atrial fibrillation: Secondary | ICD-10-CM | POA: Insufficient documentation

## 2014-12-13 DIAGNOSIS — M546 Pain in thoracic spine: Secondary | ICD-10-CM | POA: Insufficient documentation

## 2014-12-13 DIAGNOSIS — Z8669 Personal history of other diseases of the nervous system and sense organs: Secondary | ICD-10-CM | POA: Insufficient documentation

## 2014-12-13 DIAGNOSIS — F419 Anxiety disorder, unspecified: Secondary | ICD-10-CM | POA: Diagnosis not present

## 2014-12-13 DIAGNOSIS — R7989 Other specified abnormal findings of blood chemistry: Secondary | ICD-10-CM | POA: Insufficient documentation

## 2014-12-13 DIAGNOSIS — R079 Chest pain, unspecified: Secondary | ICD-10-CM | POA: Diagnosis not present

## 2014-12-13 DIAGNOSIS — Z7982 Long term (current) use of aspirin: Secondary | ICD-10-CM | POA: Insufficient documentation

## 2014-12-13 DIAGNOSIS — Z880 Allergy status to penicillin: Secondary | ICD-10-CM | POA: Diagnosis not present

## 2014-12-13 DIAGNOSIS — F329 Major depressive disorder, single episode, unspecified: Secondary | ICD-10-CM | POA: Diagnosis not present

## 2014-12-13 DIAGNOSIS — R011 Cardiac murmur, unspecified: Secondary | ICD-10-CM | POA: Insufficient documentation

## 2014-12-13 DIAGNOSIS — I10 Essential (primary) hypertension: Secondary | ICD-10-CM | POA: Diagnosis not present

## 2014-12-13 DIAGNOSIS — R9431 Abnormal electrocardiogram [ECG] [EKG]: Secondary | ICD-10-CM | POA: Diagnosis not present

## 2014-12-13 DIAGNOSIS — Z79899 Other long term (current) drug therapy: Secondary | ICD-10-CM | POA: Diagnosis not present

## 2014-12-13 DIAGNOSIS — R05 Cough: Secondary | ICD-10-CM | POA: Diagnosis not present

## 2014-12-13 DIAGNOSIS — M549 Dorsalgia, unspecified: Secondary | ICD-10-CM | POA: Diagnosis not present

## 2014-12-13 LAB — I-STAT CHEM 8, ED
BUN: 8 mg/dL (ref 6–20)
CHLORIDE: 98 mmol/L — AB (ref 101–111)
Calcium, Ion: 1.16 mmol/L (ref 1.13–1.30)
Creatinine, Ser: 0.9 mg/dL (ref 0.44–1.00)
GLUCOSE: 148 mg/dL — AB (ref 70–99)
HCT: 45 % (ref 36.0–46.0)
Hemoglobin: 15.3 g/dL — ABNORMAL HIGH (ref 12.0–15.0)
POTASSIUM: 4.1 mmol/L (ref 3.5–5.1)
Sodium: 133 mmol/L — ABNORMAL LOW (ref 135–145)
TCO2: 21 mmol/L (ref 0–100)

## 2014-12-13 LAB — CBC WITH DIFFERENTIAL/PLATELET
BASOS PCT: 0 % (ref 0–1)
Basophils Absolute: 0 10*3/uL (ref 0.0–0.1)
Eosinophils Absolute: 0 10*3/uL (ref 0.0–0.7)
Eosinophils Relative: 1 % (ref 0–5)
HCT: 41.3 % (ref 36.0–46.0)
Hemoglobin: 13.8 g/dL (ref 12.0–15.0)
Lymphocytes Relative: 31 % (ref 12–46)
Lymphs Abs: 1.4 10*3/uL (ref 0.7–4.0)
MCH: 30.9 pg (ref 26.0–34.0)
MCHC: 33.4 g/dL (ref 30.0–36.0)
MCV: 92.4 fL (ref 78.0–100.0)
Monocytes Absolute: 0.9 10*3/uL (ref 0.1–1.0)
Monocytes Relative: 20 % — ABNORMAL HIGH (ref 3–12)
NEUTROS ABS: 2.2 10*3/uL (ref 1.7–7.7)
Neutrophils Relative %: 48 % (ref 43–77)
PLATELETS: 205 10*3/uL (ref 150–400)
RBC: 4.47 MIL/uL (ref 3.87–5.11)
RDW: 13.1 % (ref 11.5–15.5)
WBC: 4.6 10*3/uL (ref 4.0–10.5)

## 2014-12-13 LAB — I-STAT TROPONIN, ED
TROPONIN I, POC: 0.07 ng/mL (ref 0.00–0.08)
Troponin i, poc: 0.08 ng/mL (ref 0.00–0.08)

## 2014-12-13 NOTE — ED Provider Notes (Signed)
CSN: [id number]     Arrival date & time 12/13/14  1923 History   First MD Initiated Contact with Patient 12/13/14 2028     Chief Complaint  Patient presents with  . Abnormal Lab  . Back Pain     (Consider location/radiation/quality/duration/timing/severity/associated sxs/prior Treatment) HPI Comments: She arrives to the emergency department as instructed by her primary care office after a troponin done in officer earlier today came back elevated. She was seen for evaluation of a sharp thoracic back pain that woke her last night and kept her up most of the night. No chest pain or SOB. No nausea or diaphoresis. The back pain is something "I have had on and off for years", and is significantly better now. She denies shortness of breath, nausea, previous ischemic heart disease. She has had a persistent cough for the last several weeks that waxes and wanes, but has been consistently without fever.   The history is provided by the patient. No language interpreter was used.    Past Medical History  Diagnosis Date  . Anxiety   . Depression   . Heart murmur     functional-normal echocardiogram  . Hypertension   . Atrial tachycardia     --sees cardiologist--Dr. Adrian Prows  . Atrial fibrillation   . Facial spasm    Past Surgical History  Procedure Laterality Date  . Tonsillectomy and adenoidectomy      --age 62  . Appendectomy      age 37  . Dilation and curettage of uterus  1990's    benign polyp   Family History  Problem Relation Age of Onset  . Cancer Father 69    dec-stomach ca  . Hypertension Mother   . Renal Disease Mother 68    dec-renal failure  . Hypertension Maternal Grandmother   . Heart attack Maternal Grandmother    History  Substance Use Topics  . Smoking status: Never Smoker   . Smokeless tobacco: Not on file  . Alcohol Use: 8.4 oz/week    14 Glasses of wine per week   OB History    Gravida Para Term Preterm AB TAB SAB Ectopic Multiple Living   3 3 3        3      Review of Systems  Constitutional: Negative for fever, chills and diaphoresis.  HENT: Negative.   Respiratory: Negative.  Negative for shortness of breath.   Cardiovascular: Negative.  Negative for chest pain.  Gastrointestinal: Negative.  Negative for nausea.  Musculoskeletal: Positive for back pain.  Skin: Negative.   Neurological: Negative.       Allergies  Ciprofloxacin; Diltiazem; Epinephrine; Other; Augmentin; Iohexol; Penicillins; and Zithromax  Home Medications   Prior to Admission medications   Medication Sig Start Date End Date Taking? Authorizing Provider  Ascorbic Acid (VITAMIN C) 1000 MG tablet Take 1,000 mg by mouth daily.   Yes Historical Provider, MD  aspirin 325 MG tablet Take 325 mg by mouth daily.   Yes Historical Provider, MD  cholecalciferol (VITAMIN D) 1000 UNITS tablet Take 1,000 Units by mouth daily.   Yes Historical Provider, MD  irbesartan (AVAPRO) 75 MG tablet Take 75 mg by mouth daily.   Yes Historical Provider, MD  LORazepam (ATIVAN) 1 MG tablet Take 1 mg by mouth 2 (two) times daily.    Yes Historical Provider, MD  magnesium oxide (MAG-OX) 400 MG tablet Take 400 mg by mouth daily.   Yes Historical Provider, MD  Misc Natural Products ([place])  CAPS Take 1 capsule by mouth daily.   Yes Historical Provider, MD  Omega 3 1000 MG CAPS Take 2,000 mg by mouth daily.   Yes Historical Provider, MD  OVER THE COUNTER MEDICATION Take 1 tablet by mouth daily. RESERVATROL   Yes Historical Provider, MD  propafenone (RYTHMOL) 150 MG tablet Take 150 mg by mouth every 8 (eight) hours.   Yes Historical Provider, MD  CRANBERRY PO Take by mouth.    Historical Provider, MD   BP 177/89 mmHg  Pulse 76  Temp(Src) 98 F (36.7 C) (Oral)  Resp 18  Ht 5\' 5"  (1.651 m)  Wt 164 lb (74.39 kg)  BMI 27.29 kg/m2  SpO2 98%  LMP 08/11/1988 (Approximate) Physical Exam  Constitutional: She is oriented to person, place, and time. She appears well-developed and  well-nourished.  HENT:  Head: Normocephalic.  Neck: Normal range of motion. Neck supple.  Cardiovascular: Normal rate and regular rhythm.   Pulmonary/Chest: Effort normal and breath sounds normal. She has no wheezes. She has no rales. She exhibits no tenderness.  Abdominal: Soft. Bowel sounds are normal. There is no tenderness. There is no rebound and no guarding.  Musculoskeletal: Normal range of motion. She exhibits no edema.  Midline mid-thoracic back tenderness without swelling or discoloration.   Neurological: She is alert and oriented to person, place, and time. No cranial nerve deficit.  Skin: Skin is warm and dry. No rash noted.  Psychiatric: She has a normal mood and affect.    ED Course  Procedures (including critical care time) Labs Review Labs Reviewed  CBC WITH DIFFERENTIAL/PLATELET - Abnormal; Notable for the following:    Monocytes Relative 20 (*)    All other components within normal limits  I-STAT CHEM 8, ED - Abnormal; Notable for the following:    Sodium 133 (*)    Chloride 98 (*)    Glucose, Bld 148 (*)    Hemoglobin 15.3 (*)    All other components within normal limits  HEPATIC FUNCTION PANEL  I-STAT TROPOININ, ED  I-STAT TROPOININ, ED   Results for orders placed or performed during the hospital encounter of 12/13/14  CBC with Differential  Result Value Ref Range   WBC 4.6 4.0 - 10.5 K/uL   RBC 4.47 3.87 - 5.11 MIL/uL   Hemoglobin 13.8 12.0 - 15.0 g/dL   HCT 41.3 36.0 - 46.0 %   MCV 92.4 78.0 - 100.0 fL   MCH 30.9 26.0 - 34.0 pg   MCHC 33.4 30.0 - 36.0 g/dL   RDW 13.1 11.5 - 15.5 %   Platelets 205 150 - 400 K/uL   Neutrophils Relative % 48 43 - 77 %   Neutro Abs 2.2 1.7 - 7.7 K/uL   Lymphocytes Relative 31 12 - 46 %   Lymphs Abs 1.4 0.7 - 4.0 K/uL   Monocytes Relative 20 (H) 3 - 12 %   Monocytes Absolute 0.9 0.1 - 1.0 K/uL   Eosinophils Relative 1 0 - 5 %   Eosinophils Absolute 0.0 0.0 - 0.7 K/uL   Basophils Relative 0 0 - 1 %   Basophils  Absolute 0.0 0.0 - 0.1 K/uL  I-Stat Troponin, ED (not at [facility])  Result Value Ref Range   Troponin i, poc 0.07 0.00 - 0.08 ng/mL   Comment 3          I-stat troponin, ED  Result Value Ref Range   Troponin i, poc 0.08 0.00 - 0.08 ng/mL   Comment 3  I-stat chem 8, ed  Result Value Ref Range   Sodium 133 (L) 135 - 145 mmol/L   Potassium 4.1 3.5 - 5.1 mmol/L   Chloride 98 (L) 101 - 111 mmol/L   BUN 8 6 - 20 mg/dL   Creatinine, Ser 0.90 0.44 - 1.00 mg/dL   Glucose, Bld 148 (H) 70 - 99 mg/dL   Calcium, Ion 1.16 1.13 - 1.30 mmol/L   TCO2 21 0 - 100 mmol/L   Hemoglobin 15.3 (H) 12.0 - 15.0 g/dL   HCT 45.0 36.0 - 46.0 %    Imaging Review No results found.   EKG Interpretation   Date/Time:  Wednesday Dec 13 2014 19:30:48 EDT Ventricular Rate:  77 PR Interval:  162 QRS Duration: 100 QT Interval:  386 QTC Calculation: 436 R Axis:   -36 Text Interpretation:  Normal sinus rhythm Possible Left atrial enlargement  Left axis deviation Incomplete right bundle branch block Nonspecific ST  and T wave abnormality Abnormal ECG Confirmed by ZAMMIT  MD, JOSEPH  ([id number]) on 12/13/2014 8:12:22 PM     Dg Chest 2 View  12/13/2014   CLINICAL DATA:  Abnormal troponin levels. Upper back pain. Initial encounter.  EXAM: CHEST  2 VIEW  COMPARISON:  Radiographs 06/21/2010 and 03/21/2010.  FINDINGS: The heart size and mediastinal contours are normal. The lungs are clear. There is no pleural effusion or pneumothorax. No acute osseous findings are identified. Telemetry leads overlie the chest.  IMPRESSION: Stable examination.  No active cardiopulmonary process.   Electronically Signed   By: Richardean Sale M.D.   On: 12/13/2014 21:22    MDM   Final diagnoses:  None    1. Thoracic back pain  The patient's EKG has non-specific changes with last comparable 12 years prior. Per patient, today's EKG was reviewed in office by Dr. Felipa Eth, compared to a recent EKG (01/16) and patient reports she was told  by him there was no change. Troponin and delta troponin in ED normal range. Back pain is reproducible and recurrent. Doubt cardiac origin. She is stable for discharge and is encouraged to see her doctor for recheck this week.     Charlann Lange, PA-C 12/14/14 [id number]  Milton Ferguson, MD 12/14/14 [phone]

## 2014-12-13 NOTE — ED Notes (Signed)
Patient transported to X-ray 

## 2014-12-13 NOTE — ED Notes (Signed)
Pt coming from [facility] family physician office with a positive troponin of 0.13. Pt denies chest pain, reports some mild back pain.

## 2014-12-14 NOTE — Discharge Instructions (Signed)
Back Pain, Adult Low back pain is very common. About 1 in 5 people have back pain.The cause of low back pain is rarely dangerous. The pain often gets better over time.About half of people with a sudden onset of back pain feel better in just 2 weeks. About 8 in 10 people feel better by 6 weeks.  CAUSES Some common causes of back pain include:  Strain of the muscles or ligaments supporting the spine.  Wear and tear (degeneration) of the spinal discs.  Arthritis.  Direct injury to the back. DIAGNOSIS Most of the time, the direct cause of low back pain is not known.However, back pain can be treated effectively even when the exact cause of the pain is unknown.Answering your caregiver's questions about your overall health and symptoms is one of the most accurate ways to make sure the cause of your pain is not dangerous. If your caregiver needs more information, he or she may order lab work or imaging tests (X-rays or MRIs).However, even if imaging tests show changes in your back, this usually does not require surgery. HOME CARE INSTRUCTIONS For many people, back pain returns.Since low back pain is rarely dangerous, it is often a condition that people can learn to manageon their own.   Remain active. It is stressful on the back to sit or stand in one place. Do not sit, drive, or stand in one place for more than 30 minutes at a time. Take short walks on level surfaces as soon as pain allows.Try to increase the length of time you walk each day.  Do not stay in bed.Resting more than 1 or 2 days can delay your recovery.  Do not avoid exercise or work.Your body is made to move.It is not dangerous to be active, even though your back may hurt.Your back will likely heal faster if you return to being active before your pain is gone.  Pay attention to your body when you bend and lift. Many people have less discomfortwhen lifting if they bend their knees, keep the load close to their bodies,and  avoid twisting. Often, the most comfortable positions are those that put less stress on your recovering back.  Find a comfortable position to sleep. Use a firm mattress and lie on your side with your knees slightly bent. If you lie on your back, put a pillow under your knees.  Only take over-the-counter or prescription medicines as directed by your caregiver. Over-the-counter medicines to reduce pain and inflammation are often the most helpful.Your caregiver may prescribe muscle relaxant drugs.These medicines help dull your pain so you can more quickly return to your normal activities and healthy exercise.  Put ice on the injured area.  Put ice in a plastic bag.  Place a towel between your skin and the bag.  Leave the ice on for 15-20 minutes, 03-04 times a day for the first 2 to 3 days. After that, ice and heat may be alternated to reduce pain and spasms.  Ask your caregiver about trying back exercises and gentle massage. This may be of some benefit.  Avoid feeling anxious or stressed.Stress increases muscle tension and can worsen back pain.It is important to recognize when you are anxious or stressed and learn ways to manage it.Exercise is a great option. SEEK MEDICAL CARE IF:  You have pain that is not relieved with rest or medicine.  You have pain that does not improve in 1 week.  You have new symptoms.  You are generally not feeling well. SEEK   IMMEDIATE MEDICAL CARE IF:   You have pain that radiates from your back into your legs.  You develop new bowel or bladder control problems.  You have unusual weakness or numbness in your arms or legs.  You develop nausea or vomiting.  You develop abdominal pain.  You feel faint. Document Released: 07/28/2005 Document Revised: 01/27/2012 Document Reviewed: 11/29/2013 Exit[employer] Patient Information 2015 [employer]. This information is not intended to replace advice given to you by your health care provider. Make sure you  discuss any questions you have with your health care provider.  

## 2014-12-15 ENCOUNTER — Ambulatory Visit (HOSPITAL_COMMUNITY)
Admission: AD | Admit: 2014-12-15 | Discharge: 2014-12-15 | Disposition: A | Discharge status: Home / Self Care | Payer: Medicare Other | Source: Ambulatory Visit | Attending: Cardiovascular Disease | Admitting: Cardiovascular Disease

## 2014-12-15 ENCOUNTER — Encounter (HOSPITAL_COMMUNITY)
Admission: AD | Disposition: A | Discharge status: Home / Self Care | Payer: Medicare Other | Source: Ambulatory Visit | Attending: Cardiovascular Disease

## 2014-12-15 ENCOUNTER — Encounter: Payer: Self-pay | Admitting: Cardiovascular Disease

## 2014-12-15 ENCOUNTER — Ambulatory Visit (INDEPENDENT_AMBULATORY_CARE_PROVIDER_SITE_OTHER): Payer: Medicare Other | Admitting: Cardiovascular Disease

## 2014-12-15 VITALS — BP 126/72 | HR 70 | Ht 65.0 in | Wt 163.8 lb

## 2014-12-15 DIAGNOSIS — I2510 Atherosclerotic heart disease of native coronary artery without angina pectoris: Secondary | ICD-10-CM | POA: Insufficient documentation

## 2014-12-15 DIAGNOSIS — Z7982 Long term (current) use of aspirin: Secondary | ICD-10-CM | POA: Diagnosis not present

## 2014-12-15 DIAGNOSIS — F329 Major depressive disorder, single episode, unspecified: Secondary | ICD-10-CM | POA: Diagnosis not present

## 2014-12-15 DIAGNOSIS — Z888 Allergy status to other drugs, medicaments and biological substances status: Secondary | ICD-10-CM | POA: Diagnosis not present

## 2014-12-15 DIAGNOSIS — I4891 Unspecified atrial fibrillation: Secondary | ICD-10-CM | POA: Diagnosis not present

## 2014-12-15 DIAGNOSIS — Z881 Allergy status to other antibiotic agents status: Secondary | ICD-10-CM | POA: Diagnosis not present

## 2014-12-15 DIAGNOSIS — I10 Essential (primary) hypertension: Secondary | ICD-10-CM | POA: Insufficient documentation

## 2014-12-15 DIAGNOSIS — I200 Unstable angina: Secondary | ICD-10-CM

## 2014-12-15 DIAGNOSIS — Z880 Allergy status to penicillin: Secondary | ICD-10-CM | POA: Diagnosis not present

## 2014-12-15 DIAGNOSIS — F419 Anxiety disorder, unspecified: Secondary | ICD-10-CM | POA: Insufficient documentation

## 2014-12-15 DIAGNOSIS — Z9049 Acquired absence of other specified parts of digestive tract: Secondary | ICD-10-CM | POA: Diagnosis not present

## 2014-12-15 DIAGNOSIS — R079 Chest pain, unspecified: Secondary | ICD-10-CM | POA: Diagnosis not present

## 2014-12-15 HISTORY — PX: CARDIAC CATHETERIZATION: SHX172

## 2014-12-15 LAB — POCT INR: INR: 1

## 2014-12-15 SURGERY — LEFT HEART CATH AND CORONARY ANGIOGRAPHY
Anesthesia: LOCAL

## 2014-12-15 MED ORDER — HEPARIN SODIUM (PORCINE) 1000 UNIT/ML IJ SOLN
INTRAMUSCULAR | Status: DC | PRN
  Administered 2014-12-15: 3500 [IU] via INTRAVENOUS

## 2014-12-15 MED ORDER — METHYLPREDNISOLONE SODIUM SUCC 125 MG IJ SOLR
INTRAMUSCULAR | Status: DC | PRN
  Administered 2014-12-15: 125 mg via INTRAVENOUS

## 2014-12-15 MED ORDER — VERAPAMIL HCL 2.5 MG/ML IV SOLN
INTRAVENOUS | Status: AC
  Filled 2014-12-15: qty 2

## 2014-12-15 MED ORDER — SODIUM CHLORIDE 0.9 % WEIGHT BASED INFUSION: 1.0000 mL/kg/h | INTRAVENOUS | Status: DC

## 2014-12-15 MED ORDER — SODIUM CHLORIDE 0.9 % IJ SOLN: 3.0000 mL | INTRAMUSCULAR | Status: DC | PRN

## 2014-12-15 MED ORDER — SODIUM CHLORIDE 0.9 % WEIGHT BASED INFUSION
3.0000 mL/kg/h | INTRAVENOUS | Status: DC
  Administered 2014-12-15: 3 mL/kg/h via INTRAVENOUS

## 2014-12-15 MED ORDER — IOHEXOL 350 MG/ML SOLN
INTRAVENOUS | Status: DC | PRN
  Administered 2014-12-15: 80 mL via INTRAVENOUS

## 2014-12-15 MED ORDER — FAMOTIDINE IN NACL 20-0.9 MG/50ML-% IV SOLN
INTRAVENOUS | Status: DC | PRN
  Administered 2014-12-15: 20 mg via INTRAVENOUS

## 2014-12-15 MED ORDER — METHYLPREDNISOLONE SODIUM SUCC 125 MG IJ SOLR
INTRAMUSCULAR | Status: AC
  Filled 2014-12-15: qty 2

## 2014-12-15 MED ORDER — FENTANYL CITRATE (PF) 100 MCG/2ML IJ SOLN
INTRAMUSCULAR | Status: DC | PRN
  Administered 2014-12-15: 25 ug via INTRAVENOUS

## 2014-12-15 MED ORDER — SODIUM CHLORIDE 0.9 % IV SOLN: 250.0000 mL | INTRAVENOUS | Status: DC | PRN

## 2014-12-15 MED ORDER — LIDOCAINE HCL (PF) 1 % IJ SOLN
INTRAMUSCULAR | Status: AC
  Filled 2014-12-15: qty 30

## 2014-12-15 MED ORDER — HEPARIN (PORCINE) IN NACL 2-0.9 UNIT/ML-% IJ SOLN
INTRAMUSCULAR | Status: AC
  Filled 2014-12-15: qty 1000

## 2014-12-15 MED ORDER — MIDAZOLAM HCL 2 MG/2ML IJ SOLN
INTRAMUSCULAR | Status: DC | PRN
Start: 2014-12-15 — End: 2014-12-15
  Administered 2014-12-15: 1 mg via INTRAVENOUS

## 2014-12-15 MED ORDER — SODIUM CHLORIDE 0.9 % IV SOLN: INTRAVENOUS | Status: AC

## 2014-12-15 MED ORDER — MIDAZOLAM HCL 2 MG/2ML IJ SOLN
INTRAMUSCULAR | Status: AC
  Filled 2014-12-15: qty 2

## 2014-12-15 MED ORDER — SODIUM CHLORIDE 0.9 % IJ SOLN: 3.0000 mL | Freq: Two times a day (BID) | INTRAMUSCULAR | Status: DC

## 2014-12-15 MED ORDER — ASPIRIN 81 MG PO CHEW: 81.0000 mg | CHEWABLE_TABLET | ORAL | Status: DC

## 2014-12-15 MED ORDER — FAMOTIDINE IN NACL 20-0.9 MG/50ML-% IV SOLN
INTRAVENOUS | Status: AC
  Filled 2014-12-15: qty 50

## 2014-12-15 MED ORDER — VERAPAMIL HCL 2.5 MG/ML IV SOLN
INTRAVENOUS | Status: DC | PRN
  Administered 2014-12-15: 16:00:00 via INTRA_ARTERIAL

## 2014-12-15 MED ORDER — HEPARIN SODIUM (PORCINE) 1000 UNIT/ML IJ SOLN
INTRAMUSCULAR | Status: AC
  Filled 2014-12-15: qty 1

## 2014-12-15 MED ORDER — FENTANYL CITRATE (PF) 100 MCG/2ML IJ SOLN
INTRAMUSCULAR | Status: AC
  Filled 2014-12-15: qty 2

## 2014-12-15 MED ORDER — HEPARIN (PORCINE) IN NACL 2-0.9 UNIT/ML-% IJ SOLN
INTRAMUSCULAR | Status: AC
  Filled 2014-12-15: qty 500

## 2014-12-15 SURGICAL SUPPLY — 15 items
CATH INFINITI 5 FR JL3.5 (CATHETERS) ×2 IMPLANT
CATH INFINITI 5FR ANG PIGTAIL (CATHETERS) ×2 IMPLANT
CATH INFINITI 5FR MULTPACK ANG (CATHETERS) IMPLANT
CATH INFINITI JR4 5F (CATHETERS) ×2 IMPLANT
DEVICE RAD COMP TR BAND LRG (VASCULAR PRODUCTS) ×2 IMPLANT
GLIDESHEATH SLEND SS 6F .021 (SHEATH) ×2 IMPLANT
KIT HEART LEFT (KITS) ×2 IMPLANT
PACK CARDIAC CATHETERIZATION (CUSTOM PROCEDURE TRAY) ×2 IMPLANT
SHEATH PINNACLE 5F 10CM (SHEATH) IMPLANT
SYR MEDRAD MARK V 150ML (SYRINGE) ×2 IMPLANT
TRANSDUCER W/STOPCOCK (MISCELLANEOUS) ×2 IMPLANT
TUBING CIL FLEX 10 FLL-RA (TUBING) ×2 IMPLANT
WIRE EMERALD 3MM-J .035X150CM (WIRE) IMPLANT
WIRE HI TORQ VERSACORE-J 145CM (WIRE) ×2 IMPLANT
WIRE SAFE-T 1.5MM-J .035X260CM (WIRE) ×2 IMPLANT

## 2014-12-15 NOTE — Interval H&P Note (Signed)
History and Physical Interval Note:  12/15/2014 4:02 PM  [name]  has presented today for cardiac cath with the diagnosis of chest pain, NSTEMI.  The various methods of treatment have been discussed with the patient and family. After consideration of risks, benefits and other options for treatment, the patient has consented to  Procedure(s): Left Heart Cath and Coronary Angiography (N/A) as a surgical intervention .  The patient's history has been reviewed, patient examined, no change in status, stable for surgery.  I have reviewed the patient's chart and labs.  Questions were answered to the patient's satisfaction.    Cath Lab Visit (complete for each Cath Lab visit)  Clinical Evaluation Leading to the Procedure:   ACS: No.  Non-ACS:    Anginal Classification: CCS III  Anti-ischemic medical therapy: No Therapy  Non-Invasive Test Results: No non-invasive testing performed  Prior CABG: No previous CABG        Katherine Cardenas

## 2014-12-15 NOTE — Patient Instructions (Signed)
Medication Instructions:  Your physician recommends that you continue on your current medications as directed. Please refer to the Current Medication list given to you today.   Labwork: None  Testing/Procedures: Your physician has requested that you have a cardiac catheterization. Cardiac catheterization is used to diagnose and/or treat various heart conditions. Doctors may recommend this procedure for a number of different reasons. The most common reason is to evaluate chest pain. Chest pain can be a symptom of coronary artery disease (CAD), and cardiac catheterization can show whether plaque is narrowing or blocking your heart's arteries. This procedure is also used to evaluate the valves, as well as measure the blood flow and oxygen levels in different parts of your heart. For further information please visit [email or web address]. Please follow instruction sheet, as given.    Follow-Up: Your physician recommends that you schedule a follow-up appointment in: as needed with Dr. Acie Fredrickson

## 2014-12-15 NOTE — H&P (View-Only) (Signed)
 Cardiology Office Note   Date:  12/15/2014   ID:  Katherine Cardenas, DOB 06/18/1941, MRN [id number]  PCP:  STONEKING,HAL THOMAS, MD  Cardiologist:   Brystol Wasilewski J, MD   No chief complaint on file.  Problem List 1. Viral URI 2. NSTEMI    History of Present Illness: Katherine Cardenas is a 74 y.o. female who presents for follow up of an episode of CP . She work up during the night on Tuesday -  Upper back . Fairly severe .  Across her back .  Tried a heating pad. Lasted for 2-3 hours. Went back to sleep . Woke up in the am without any back pain . Saw Dr. Stoneking's PA the next am.   Went to the ER.  Serial troponin levels were constat, did not increase  ECG showed NS changes.   Troponin level = 0.08. Has some jaw, teeth and upper jaw pain on occasion .  Thinks it's TMJ disease  Very active , works in the garden.  Does Tai Chi.  No further episodes of pain - its been raining so no yard work .      Past Medical History  Diagnosis Date  . Anxiety   . Depression   . Heart murmur     functional-normal echocardiogram  . Hypertension   . Atrial tachycardia     --sees cardiologist--Dr. David Fitzgerald  . Atrial fibrillation   . Facial spasm     Past Surgical History  Procedure Laterality Date  . Tonsillectomy and adenoidectomy      --age 1  . Appendectomy      age 7  . Dilation and curettage of uterus  1990's    benign polyp     Current Outpatient Prescriptions  Medication Sig Dispense Refill  . Ascorbic Acid (VITAMIN C) 1000 MG tablet Take 1,000 mg by mouth daily.    . aspirin 325 MG tablet Take 325 mg by mouth daily.    . cholecalciferol (VITAMIN D) 1000 UNITS tablet Take 1,000 Units by mouth daily.    . CRANBERRY PO Take by mouth.    . doxycycline (VIBRA-TABS) 100 MG tablet Take 100 mg by mouth 2 (two) times daily.   0  . irbesartan (AVAPRO) 75 MG tablet Take 75 mg by mouth daily.    . LORazepam (ATIVAN) 1 MG tablet Take 1 mg by mouth 2 (two) times daily.       . magnesium oxide (MAG-OX) 400 MG tablet Take 400 mg by mouth daily.    . Misc Natural Products (TURMERIC CURCUMIN) CAPS Take 1 capsule by mouth daily.    . Omega 3 1000 MG CAPS Take 2,000 mg by mouth daily.    . OVER THE COUNTER MEDICATION Take 1 tablet by mouth daily. RESERVATROL    . propafenone (RYTHMOL) 150 MG tablet Take 150 mg by mouth every 8 (eight) hours.     No current facility-administered medications for this visit.    Allergies:   Ciprofloxacin; Diltiazem; Epinephrine; Other; Augmentin; Iohexol; Penicillins; and Zithromax    Social History:  The patient  reports that she has never smoked. She does not have any smokeless tobacco history on file. She reports that she drinks about 8.4 oz of alcohol per week. She reports that she does not use illicit drugs.   Family History:  The patient's family history includes Cancer (age of onset: 69) in her father; Heart attack in her maternal grandmother; Hypertension in her maternal grandmother and   mother; Renal Disease (age of onset: 69) in her mother.    ROS:  Please see the history of present illness.    Review of Systems: Constitutional:  denies fever, chills, diaphoresis, appetite change and fatigue.  HEENT: denies photophobia, eye pain, redness, hearing loss, ear pain, congestion, sore throat, rhinorrhea, sneezing, neck pain, neck stiffness and tinnitus.  Respiratory: denies SOB, DOE, cough, chest tightness, and wheezing.  Cardiovascular: denies chest pain, palpitations and leg swelling.  Gastrointestinal: denies nausea, vomiting, abdominal pain, diarrhea, constipation, blood in stool.  Genitourinary: denies dysuria, urgency, frequency, hematuria, flank pain and difficulty urinating.  Musculoskeletal: denies  myalgias, back pain, joint swelling, arthralgias and gait problem.   Skin: denies pallor, rash and wound.  Neurological: denies dizziness, seizures, syncope, weakness, light-headedness, numbness and headaches.    Hematological: denies adenopathy, easy bruising, personal or family bleeding history.  Psychiatric/ Behavioral: denies suicidal ideation, mood changes, confusion, nervousness, sleep disturbance and agitation.       All other systems are reviewed and negative.    PHYSICAL EXAM: VS:  BP 126/72 mmHg  Pulse 70  Ht 5' 5" (1.651 m)  Wt 163 lb 12.8 oz (74.299 kg)  BMI 27.26 kg/m2  LMP 08/11/1988 (Approximate) , BMI Body mass index is 27.26 kg/(m^2). GEN: Well nourished, well developed, in no acute distress HEENT: normal Neck: no JVD, carotid bruits, or masses Cardiac: RRR; no murmurs, rubs, or gallops,no edema  Respiratory:  clear to auscultation bilaterally, normal work of breathing GI: soft, nontender, nondistended, + BS MS: no deformity or atrophy Skin: warm and dry, no rash Neuro:  Strength and sensation are intact Psych: normal   EKG:  EKG is ordered today. The ekg ordered today demonstrates  NSR at 70.  TWI in the inf. And lateral leads that appears worse than previous tracings    Recent Labs: 12/13/2014: BUN 8; Creatinine 0.90; Hemoglobin 15.3*; Platelets 205; Potassium 4.1; Sodium 133*    Lipid Panel No results found for: CHOL, TRIG, HDL, CHOLHDL, VLDL, LDLCALC, LDLDIRECT    Wt Readings from Last 3 Encounters:  12/15/14 163 lb 12.8 oz (74.299 kg)  12/13/14 164 lb (74.39 kg)  09/28/14 167 lb (75.751 kg)      Other studies Reviewed: Additional studies/ records that were reviewed today include: . Review of the above records demonstrates:    ASSESSMENT AND PLAN:  1. Unstable angina: The patient presents with some intrascapular pain that lasted 2 or 3 hours. The following morning she had an elevated troponin level. She has T-wave inversion at baseline but her EKG appears to be a little bit worse today compared to 2 days ago. I think that she needs to have a heart catheterization for further evaluation. I discussed the risks, benefits, and options of Cardiac cath.    She understands and agrees to proceed.  Current medicines are reviewed at length with the patient today.  The patient does not have concerns regarding medicines.  The following changes have been made:  no change  Labs/ tests ordered today include:  No orders of the defined types were placed in this encounter.     Disposition:   FU with me as needed.  If the cath is normal , she will follow up with Dr. Fitzgerald.  If she has a PCI, I will see her in several weeks.      Wateen Varon J, MD  12/15/2014 12:22 PM    [facility] [address], [place], [place]  [address]   Phone: [phone]; Fax: [phone]   [facility]  [address] [place], [place]  [address] [phone]    Fax [phone]

## 2014-12-15 NOTE — Discharge Instructions (Signed)
Radial Site Care °Refer to this sheet in the next few weeks. These instructions provide you with information on caring for yourself after your procedure. Your caregiver may also give you more specific instructions. Your treatment has been planned according to current medical practices, but problems sometimes occur. Call your caregiver if you have any problems or questions after your procedure. °HOME CARE INSTRUCTIONS °· You may shower the day after the procedure. Remove the bandage (dressing) and gently wash the site with plain soap and water. Gently pat the site dry. °· Do not apply powder or lotion to the site. °· Do not submerge the affected site in water for 3 to 5 days. °· Inspect the site at least twice daily. °· Do not flex or bend the affected arm for 24 hours. °· No lifting over 5 pounds (2.3 kg) for 5 days after your procedure. °· Do not drive home if you are discharged the same day of the procedure. Have someone else drive you. °· You may drive 24 hours after the procedure unless otherwise instructed by your caregiver. °· Do not operate machinery or power tools for 24 hours. °· A responsible adult should be with you for the first 24 hours after you arrive home. °What to expect: °· Any bruising will usually fade within 1 to 2 weeks. °· Blood that collects in the tissue (hematoma) may be painful to the touch. It should usually decrease in size and tenderness within 1 to 2 weeks. °SEEK IMMEDIATE MEDICAL CARE IF: °· You have unusual pain at the radial site. °· You have redness, warmth, swelling, or pain at the radial site. °· You have drainage (other than a small amount of blood on the dressing). °· You have chills. °· You have a fever or persistent symptoms for more than 72 hours. °· You have a fever and your symptoms suddenly get worse. °· Your arm becomes pale, cool, tingly, or numb. °· You have heavy bleeding from the site. Hold pressure on the site. °Document Released: 08/30/2010 Document Revised:  10/20/2011 Document Reviewed: 08/30/2010 °ExitCare® Patient Information ©2015 [employer]. This information is not intended to replace advice given to you by your health care provider. Make sure you discuss any questions you have with your health care provider. ° °

## 2014-12-15 NOTE — Progress Notes (Signed)
Cardiology Office Note   Date:  12/15/2014   ID:  Katherine Cardenas, DOB 03/17/1941, MRN [id number]  PCP:  Mathews Argyle, MD  Cardiologist:   Thayer Headings, MD   No chief complaint on file.  Problem List 1. Viral URI 2. NSTEMI    History of Present Illness: Katherine Cardenas is a 74 y.o. female who presents for follow up of an episode of CP . She work up during the night on Tuesday -  Upper back . Fairly severe .  Across her back .  Tried a heating pad. Lasted for 2-3 hours. Went back to sleep . Woke up in the am without any back pain . Saw Dr. Carlyle Lipa PA the next am.   Martin Majestic to the ER.  Serial troponin levels were constat, did not increase  ECG showed NS changes.   Troponin level = 0.08. Has some jaw, teeth and upper jaw pain on occasion .  Thinks it's TMJ disease  Very active , works in the garden.  Does Tai Chi.  No further episodes of pain - its been raining so no yard work .      Past Medical History  Diagnosis Date  . Anxiety   . Depression   . Heart murmur     functional-normal echocardiogram  . Hypertension   . Atrial tachycardia     --sees cardiologist--Dr. Adrian Prows  . Atrial fibrillation   . Facial spasm     Past Surgical History  Procedure Laterality Date  . Tonsillectomy and adenoidectomy      --age 62  . Appendectomy      age 10  . Dilation and curettage of uterus  1990's    benign polyp     Current Outpatient Prescriptions  Medication Sig Dispense Refill  . Ascorbic Acid (VITAMIN C) 1000 MG tablet Take 1,000 mg by mouth daily.    [name] aspirin 325 MG tablet Take 325 mg by mouth daily.    . cholecalciferol (VITAMIN D) 1000 UNITS tablet Take 1,000 Units by mouth daily.    [name] CRANBERRY PO Take by mouth.    . doxycycline (VIBRA-TABS) 100 MG tablet Take 100 mg by mouth 2 (two) times daily.   0  . irbesartan (AVAPRO) 75 MG tablet Take 75 mg by mouth daily.    [name] LORazepam (ATIVAN) 1 MG tablet Take 1 mg by mouth 2 (two) times daily.       . magnesium oxide (MAG-OX) 400 MG tablet Take 400 mg by mouth daily.    . Misc Natural Products (TURMERIC CURCUMIN) CAPS Take 1 capsule by mouth daily.    . Omega 3 1000 MG CAPS Take 2,000 mg by mouth daily.    [name] OVER THE COUNTER MEDICATION Take 1 tablet by mouth daily. RESERVATROL    . propafenone (RYTHMOL) 150 MG tablet Take 150 mg by mouth every 8 (eight) hours.     No current facility-administered medications for this visit.    Allergies:   Ciprofloxacin; Diltiazem; Epinephrine; Other; Augmentin; Iohexol; Penicillins; and Zithromax    Social History:  The patient  reports that she has never smoked. She does not have any smokeless tobacco history on file. She reports that she drinks about 8.4 oz of alcohol per week. She reports that she does not use illicit drugs.   Family History:  The patient's family history includes Cancer (age of onset: 94) in her father; Heart attack in her maternal grandmother; Hypertension in her maternal grandmother and  mother; Renal Disease (age of onset: 58) in her mother.    ROS:  Please see the history of present illness.    Review of Systems: Constitutional:  denies fever, chills, diaphoresis, appetite change and fatigue.  HEENT: denies photophobia, eye pain, redness, hearing loss, ear pain, congestion, sore throat, rhinorrhea, sneezing, neck pain, neck stiffness and tinnitus.  Respiratory: denies SOB, DOE, cough, chest tightness, and wheezing.  Cardiovascular: denies chest pain, palpitations and leg swelling.  Gastrointestinal: denies nausea, vomiting, abdominal pain, diarrhea, constipation, blood in stool.  Genitourinary: denies dysuria, urgency, frequency, hematuria, flank pain and difficulty urinating.  Musculoskeletal: denies  myalgias, back pain, joint swelling, arthralgias and gait problem.   Skin: denies pallor, rash and wound.  Neurological: denies dizziness, seizures, syncope, weakness, light-headedness, numbness and headaches.    Hematological: denies adenopathy, easy bruising, personal or family bleeding history.  Psychiatric/ Behavioral: denies suicidal ideation, mood changes, confusion, nervousness, sleep disturbance and agitation.       All other systems are reviewed and negative.    PHYSICAL EXAM: VS:  BP 126/72 mmHg  Pulse 70  Ht 5' 5" (1.651 m)  Wt 163 lb 12.8 oz (74.299 kg)  BMI 27.26 kg/m2  LMP 08/11/1988 (Approximate) , BMI Body mass index is 27.26 kg/(m^2). GEN: Well nourished, well developed, in no acute distress HEENT: normal Neck: no JVD, carotid bruits, or masses Cardiac: RRR; no murmurs, rubs, or gallops,no edema  Respiratory:  clear to auscultation bilaterally, normal work of breathing GI: soft, nontender, nondistended, + BS MS: no deformity or atrophy Skin: warm and dry, no rash Neuro:  Strength and sensation are intact Psych: normal   EKG:  EKG is ordered today. The ekg ordered today demonstrates  NSR at 70.  TWI in the inf. And lateral leads that appears worse than previous tracings    Recent Labs: 12/13/2014: BUN 8; Creatinine 0.90; Hemoglobin 15.3*; Platelets 205; Potassium 4.1; Sodium 133*    Lipid Panel No results found for: CHOL, TRIG, HDL, CHOLHDL, VLDL, LDLCALC, LDLDIRECT    Wt Readings from Last 3 Encounters:  12/15/14 163 lb 12.8 oz (74.299 kg)  12/13/14 164 lb (74.39 kg)  09/28/14 167 lb (75.751 kg)      Other studies Reviewed: Additional studies/ records that were reviewed today include: . Review of the above records demonstrates:    ASSESSMENT AND PLAN:  1. Unstable angina: The patient presents with some intrascapular pain that lasted 2 or 3 hours. The following morning she had an elevated troponin level. She has T-wave inversion at baseline but her EKG appears to be a little bit worse today compared to 2 days ago. I think that she needs to have a heart catheterization for further evaluation. I discussed the risks, benefits, and options of Cardiac cath.    She understands and agrees to proceed.  Current medicines are reviewed at length with the patient today.  The patient does not have concerns regarding medicines.  The following changes have been made:  no change  Labs/ tests ordered today include:  No orders of the defined types were placed in this encounter.     Disposition:   FU with me as needed.  If the cath is normal , she will follow up with Dr. Ola Spurr.  If she has a PCI, I will see her in several weeks.      Nahser, Wonda Cheng, MD  12/15/2014 12:22 PM    [facility] [place], [place], [place]  [address]  Phone: [phone]; Fax: [phone]   [facility]  [address] [place], [place]  [address] [phone]    Fax [phone]

## 2014-12-18 ENCOUNTER — Encounter (HOSPITAL_COMMUNITY): Payer: Self-pay | Admitting: Cardiovascular Disease

## 2014-12-18 ENCOUNTER — Telehealth: Payer: Self-pay | Admitting: Cardiovascular Disease

## 2014-12-18 MED FILL — Heparin Sodium (Porcine) 2 Unit/ML in Sodium Chloride 0.9%: INTRAMUSCULAR | Qty: 1500 | Status: AC

## 2014-12-18 MED FILL — Lidocaine HCl Local Preservative Free (PF) Inj 1%: INTRAMUSCULAR | Qty: 30 | Status: AC

## 2014-12-18 NOTE — Telephone Encounter (Signed)
The cath was normal so it appears that this is not a cardiac issue. I would encourage her to get back in touch with Dr. Felipa Eth to see what else he has to offer

## 2014-12-18 NOTE — Telephone Encounter (Signed)
I called patient and talked.  She is still having some fatigue. No CP. No dyspnea. She is still fighting her viral URI. She may have had some minimla viral cardiomyopathy but her LV function was normal. She had a very small area of akinesis of the mid inferior wall.   ? Spasm. No corresponding CAD to explain the wall motion defect,. We will see Katherine Cardenas on June 3 for follow up visit - sooner if needed.

## 2014-12-18 NOTE — Telephone Encounter (Signed)
New message    Patient calling has some questions regarding follow up on cath.

## 2014-12-18 NOTE — Telephone Encounter (Signed)
Spoke with patient who states she was so relieved with good results from cardiac cath on Friday, that she forgot to ask Dr. Angelena Form (who performed cath) when she should expect to feel better.  She states Dr. Acie Fredrickson told her at office visit on Friday that her ekg continues to change and now that she knows it is likely a virus that is causing her symptoms, she wants to know what follow-up will occur.  Patient states she is feeling worse today; states she is fatigued and noticed that when she got up to walk outside that her heart is racing.  I advised patient to make certain she is well-hydrated and that I will send a message to Dr. Acie Fredrickson regarding follow-up.  Patient thanked me for the call and states she is very pleased with the care she has received from our practice thus far.  I thanked the patient and advised I will call back later today with Dr. Elmarie Shiley advice.

## 2015-01-09 NOTE — Telephone Encounter (Signed)
Call to patient notified of Dr Elza Rafter recommendation for second opinion with gyn oncologist. Patient is agreeable to this plan. States that since she last spoke with me, she and her husband have continued to be ill with virus. This has caused some cardiac issues and she may have had "small heart attack" although heart cath was clear.  Still seeing cardiology for evaluation. She is still not back to full strength. Willing to see GYN/ONC but would like appointment to be in about three weeks when cardio work-up is completed.  Call to [facility]. Left message with Erasmo Downer for appointment.   CC: Karen Chafe, RN

## 2015-01-10 ENCOUNTER — Telehealth: Payer: Self-pay | Admitting: *Deleted

## 2015-01-10 NOTE — Telephone Encounter (Signed)
Received referral from Gay Filler at Dr. Elza Rafter office for patient. Returned call to [place] at Dr. Elza Rafter office with appt on 01/29/15 at 9:45am with Dr. Denman George. Olivia Mackie agreeable to notify patient of appt.

## 2015-01-10 NOTE — Telephone Encounter (Signed)
Message left to return call to [place] at [phone] at home number.   Received call from [place] at [facility] oncology. Patient is schedule for consult with Dr. Denman George 01/29/15 arrive at 915 for a 945 appointment.

## 2015-01-11 NOTE — Telephone Encounter (Signed)
Spoke with patient and she confirms receipt of appointment information for consult with Dr. Denman George. Patient is advised will need to arrive  30 minutes early for registration. Advised patient to  bring your photo identification card and a copy of your insurance card. Patient aware that the Doctor will complete a pelvic exam on this day as well. Patient has questions regarding insurance coverage of visit, patient given directly number to gyn onc to call to discuss or can direct her to financial coordinators at [facility], patient agreeable.   Routing to provider for final review. Patient agreeable to disposition. Will close encounter.    cc Lamont Snowball, RN

## 2015-01-12 ENCOUNTER — Ambulatory Visit (INDEPENDENT_AMBULATORY_CARE_PROVIDER_SITE_OTHER): Payer: Medicare Other | Admitting: Cardiovascular Disease

## 2015-01-12 ENCOUNTER — Telehealth: Payer: Self-pay | Admitting: Cardiovascular Disease

## 2015-01-12 ENCOUNTER — Encounter: Payer: Self-pay | Admitting: Cardiovascular Disease

## 2015-01-12 VITALS — BP 160/90 | HR 68 | Ht 65.5 in | Wt 162.6 lb

## 2015-01-12 DIAGNOSIS — I2510 Atherosclerotic heart disease of native coronary artery without angina pectoris: Secondary | ICD-10-CM

## 2015-01-12 DIAGNOSIS — I200 Unstable angina: Secondary | ICD-10-CM | POA: Diagnosis not present

## 2015-01-12 LAB — BASIC METABOLIC PANEL
BUN: 13 mg/dL (ref 6–23)
CALCIUM: 9.4 mg/dL (ref 8.4–10.5)
CHLORIDE: 100 meq/L (ref 96–112)
CO2: 28 mEq/L (ref 19–32)
Creatinine, Ser: 0.96 mg/dL (ref 0.40–1.20)
GFR: 60.01 mL/min (ref >60.00)
Glucose, Bld: 96 mg/dL (ref 70–99)
POTASSIUM: 4.4 meq/L (ref 3.5–5.1)
Sodium: 133 mEq/L — ABNORMAL LOW (ref 135–145)

## 2015-01-12 LAB — HEPATIC FUNCTION PANEL
ALT: 17 U/L (ref 0–35)
AST: 25 U/L (ref 0–37)
Albumin: 4.2 g/dL (ref 3.5–5.2)
Alkaline Phosphatase: 56 U/L (ref 39–117)
BILIRUBIN TOTAL: 1 mg/dL (ref 0.2–1.2)
Bilirubin, Direct: 0.2 mg/dL (ref 0.0–0.3)
TOTAL PROTEIN: 7 g/dL (ref 6.0–8.3)

## 2015-01-12 LAB — LIPID PANEL
CHOL/HDL RATIO: 3
Cholesterol: 204 mg/dL — ABNORMAL HIGH (ref 0–200)
HDL: 71.5 mg/dL (ref >39.00)
LDL Cholesterol: 120 mg/dL — ABNORMAL HIGH (ref 0–99)
NONHDL: 132.5
TRIGLYCERIDES: 61 mg/dL (ref 0.0–149.0)
VLDL: 12.2 mg/dL (ref 0.0–40.0)

## 2015-01-12 NOTE — Telephone Encounter (Signed)
New message      Pt was seen today.  Have a question about something she read on her visit summary

## 2015-01-12 NOTE — Patient Instructions (Signed)

## 2015-01-12 NOTE — Progress Notes (Signed)
Cardiology Office Note   Date:  01/12/2015   ID:  Katherine Cardenas, DOB [birth year], MRN [id number]  PCP:  Ginette Otto, MD  Cardiologist:   Vesta Mixer, MD   Chief Complaint  Patient presents with  . Follow-up    chest discomfort, abn. ECG   Problem List 1. Viral URI 2. NSTEMI    History of Present Illness: Katherine Cardenas is a 74 y.o. female who presents for follow up of an episode of CP . She work up during the night on Tuesday -  Upper back . Fairly severe .  Across her back .  Tried a heating pad. Lasted for 2-3 hours. Went back to sleep . Woke up in the am without any back pain . Saw Dr. Laverle Hobby PA the next am.   Micah Flesher to the ER.  Serial troponin levels were constat, did not increase  ECG showed NS changes.   Troponin level = 0.08. Has some jaw, teeth and upper jaw pain on occasion .  Thinks it's TMJ disease  Very active , works in the garden.  Does Tai Chi.  No further episodes of pain - its been raining so no yard work .    January 12, 2015:   She had a cardiac cath that showed mild CAD.  Her LV function was normal but there was a subtle inferior wall motion defect - she may have had coronary spasm vs. Blockage of a tiny branch.   Its also possible that she had a viral cardiomyopathy with subsequent tirvial Troponin increase and a subtle wall motion abn.   Past Medical History  Diagnosis Date  . Anxiety   . Depression   . Heart murmur     functional-normal echocardiogram  . Hypertension   . Atrial tachycardia     --sees cardiologist--Dr. Clydie Braun  . Atrial fibrillation   . Facial spasm     Past Surgical History  Procedure Laterality Date  . Tonsillectomy and adenoidectomy      --age 76  . Appendectomy      age 49  . Dilation and curettage of uterus  1990's    benign polyp  . Cardiac catheterization N/A 12/15/2014    Procedure: Left Heart Cath and Coronary Angiography;  Surgeon: Kathleene Hazel, MD;  Location: [facility] INVASIVE CV LAB;   Service: Cardiovascular;  Laterality: N/A;     Current Outpatient Prescriptions  Medication Sig Dispense Refill  . Ascorbic Acid (VITAMIN C) 1000 MG tablet Take 1,000 mg by mouth daily before supper.     [name] aspirin 325 MG tablet Take 325 mg by mouth daily.    . cholecalciferol (VITAMIN D) 1000 UNITS tablet Take 1,000 Units by mouth at bedtime.     [name] CRANBERRY PO Take 1 capsule by mouth daily with lunch.     . irbesartan (AVAPRO) 75 MG tablet Take 75 mg by mouth at bedtime.     [name] LORazepam (ATIVAN) 1 MG tablet Take 1 mg by mouth 2 (two) times daily.     . Magnesium 400 MG CAPS Take 400 mg by mouth daily.    . Misc Natural Products (TURMERIC CURCUMIN) CAPS Take 1 capsule by mouth daily with lunch.     . Omega-3 Fatty Acids (FISH OIL) 1000 MG CAPS Take 1,000 mg by mouth 2 (two) times daily.    Bertram Gala Glycol-Propyl Glycol (SYSTANE OP) Place 1 drop into both eyes 2 (two) times daily.    . propafenone (RYTHMOL)  150 MG tablet Take 150 mg by mouth every 8 (eight) hours.    . RESVERATROL PO Take 1 capsule by mouth at bedtime.     No current facility-administered medications for this visit.    Allergies:   Ciprofloxacin; Diltiazem; Epinephrine; Other; Shrimp; Augmentin; Iohexol; Penicillins; and Zithromax    Social History:  The patient  reports that she has never smoked. She does not have any smokeless tobacco history on file. She reports that she drinks about 8.4 oz of alcohol per week. She reports that she does not use illicit drugs.   Family History:  The patient's family history includes Cancer (age of onset: 31) in her father; Heart attack in her maternal grandmother; Hypertension in her maternal grandmother and mother; Renal Disease (age of onset: 29) in her mother.    ROS:  Please see the history of present illness.    Review of Systems: Constitutional:  denies fever, chills, diaphoresis, appetite change and fatigue.  HEENT: denies photophobia, eye pain, redness, hearing loss,  ear pain, congestion, sore throat, rhinorrhea, sneezing, neck pain, neck stiffness and tinnitus.  Respiratory: denies SOB, DOE, cough, chest tightness, and wheezing.  Cardiovascular: denies chest pain, palpitations and leg swelling.  Gastrointestinal: denies nausea, vomiting, abdominal pain, diarrhea, constipation, blood in stool.  Genitourinary: denies dysuria, urgency, frequency, hematuria, flank pain and difficulty urinating.  Musculoskeletal: denies  myalgias, back pain, joint swelling, arthralgias and gait problem.   Skin: denies pallor, rash and wound.  Neurological: denies dizziness, seizures, syncope, weakness, light-headedness, numbness and headaches.   Hematological: denies adenopathy, easy bruising, personal or family bleeding history.  Psychiatric/ Behavioral: denies suicidal ideation, mood changes, confusion, nervousness, sleep disturbance and agitation.       All other systems are reviewed and negative.    PHYSICAL EXAM: VS:  BP 160/90 mmHg  Pulse 68  Ht 5' 5.5" (1.664 m)  Wt 73.755 kg (162 lb 9.6 oz)  BMI 26.64 kg/m2  LMP 08/11/1988 (Approximate) , BMI Body mass index is 26.64 kg/(m^2). GEN: Well nourished, well developed, in no acute distress HEENT: normal Neck: no JVD, carotid bruits, or masses Cardiac: RRR; no murmurs, rubs, or gallops,no edema  Respiratory:  clear to auscultation bilaterally, normal work of breathing GI: soft, nontender, nondistended, + BS MS: no deformity or atrophy Skin: warm and dry, no rash Neuro:  Strength and sensation are intact Psych: normal   EKG:  EKG is not ordered today.     Recent Labs: 12/13/2014: BUN 8; Creatinine 0.90; Hemoglobin 15.3*; Platelets 205; Potassium 4.1; Sodium 133*    Lipid Panel No results found for: CHOL, TRIG, HDL, CHOLHDL, VLDL, LDLCALC, LDLDIRECT    Wt Readings from Last 3 Encounters:  01/12/15 73.755 kg (162 lb 9.6 oz)  12/15/14 73.936 kg (163 lb)  12/15/14 74.299 kg (163 lb 12.8 oz)       Other studies Reviewed: Additional studies/ records that were reviewed today include: . Review of the above records demonstrates:    ASSESSMENT AND PLAN:  1.  CAD   Katherine Cardenas had prolonged episodes of back pain which we thought was an anginal equivalent.  . She was found to have an abnormal EKG and also had a troponin elevation. Performed a cardiac catheter station which revealed only minor coronary artery irregularities. She was found to have a subtle inferior wall motion abdomen mouth he. My guess is that she either had an occlusion of a very tiny branch of her coronary arteries or perhaps had a viral myocarditis.  She's had some persistent fatigue and shortness breath but describes he seems to be improving.  At this point we will continue with her same medications. I'll plan on seeing her again in one year. We'll draw labs today including  BMP , fasting labs, liver enz.     Current medicines are reviewed at length with the patient today.  The patient does not have concerns regarding medicines.  The following changes have been made:  no change  Labs/ tests ordered today include:  No orders of the defined types were placed in this encounter.     Disposition:   Follow up with me in 1 years.        Nahser, Wonda Cheng, MD  01/12/2015 10:28 AM    [address], [place], [place]  [address] Phone: [phone]; Fax: [phone]   [facility]  [address] [place], [place]  [address] [phone]    Fax [phone]

## 2015-01-12 NOTE — Telephone Encounter (Signed)
Spoke with patient who called to ask about diagnosis of CAD on AVS that was printed after office visit today.  I explained that patient has minimal CAD but that coding does not allow [place] to use option of minimal.  I reviewed patient's cath report with her and she verbalized understanding and agreement.  She thanked me for the call.

## 2015-01-29 ENCOUNTER — Encounter: Payer: Self-pay | Admitting: Gynecologic Oncology

## 2015-01-29 ENCOUNTER — Ambulatory Visit: Payer: Medicare Other | Attending: Gynecologic Oncology | Admitting: Gynecologic Oncology

## 2015-01-29 VITALS — BP 158/74 | HR 74 | Temp 97.5°F | Resp 18 | Ht 65.5 in | Wt 159.9 lb

## 2015-01-29 DIAGNOSIS — R9389 Abnormal findings on diagnostic imaging of other specified body structures: Secondary | ICD-10-CM

## 2015-01-29 DIAGNOSIS — R938 Abnormal findings on diagnostic imaging of other specified body structures: Secondary | ICD-10-CM

## 2015-01-29 NOTE — Progress Notes (Signed)
Consult Note: Gyn-Onc  Consult was requested by Dr. Quincy Simmonds for the evaluation of Katherine Cardenas 74 y.o. female  CC:  Chief Complaint  Patient presents with  . endometrial thickening on ultrasound    New consult    Assessment/Plan:  Ms. Katherine Cardenas  is a 74 y.o.  year old with a thickened endometrial stripe on ultrasound without symptoms concerning for endometrial cancer.   While endometrial sampling would provide definitive histologic confirmation of benign versus malignant pathology, the probability of an underlying occult malignancy is low in a woman without menopausal bleeding and an endometrial stripe less than 10 mm. Therefore, given that the patient feels strongly that she should not undergo an invasive procedure without more evidence for possible malignancy, I believe it is reasonable to hold off biopsy at this time, and repeat ultrasound imaging in 6 months. If there is a greater than 50% increase in endometrial thickness at this time, I would recommend endometrial sampling with a [place] biopsy, and the patient states she is agreeable to this in that setting. If a repeat ultrasound 6 months shows a stable endometrial thickness, I would recommend no additional follow-up for this condition including no further imaging.  The patient acknowledges that she is accepting a small risk for occult cancer in the uterus, however in her mind she will prefers to take on this risk rather than a risk for a complication from her procedure. I feel that is an acceptable mindset and that she demonstrates a deep understanding of the probabilities of uterus conditions, the potential adverse impact of delayed diagnosis, and her own clear minded assessment of risks and benefits.   HPI: Katherine Cardenas is an extremely pleasant 74 year old gravida 3 para 3 who is seen in consultation at the request of Dr. Quincy Simmonds for a thickened endometrial stripe. The patient was experiencing recurrent urinary tract infections  and a sensation of pelvic fullness. She sought consultation at her OB/GYN office in February 2016 and hadn't normal examination. However given her symptoms of pelvic fullness she was recommended to undergo transvaginal ultrasound scan to evaluate pelvic organs. This ultrasound performed on 09/28/2014 revealed a uterus measuring 6 x 4 x 3 cm with an endometrial thickness of 6.78mm with cystic areas. The patient reports no menopausal bleeding vaginally. She also reports no abnormal discharge.  Given that the endometrial thickness was greater than 5 mm she was recommended to undergo endometrial sampling to rule out malignancy. The patient has concerns about invasive procedures (she elected for a virtual colonoscopy rather than an endoscopic colonoscopy) and she is additionally recovering from a recent viral illness and puddling with social challenges of husband with progressive dementia. She strongly prefers to not undergo an endometrial biopsy particularly given that her reading of the literature suggests that in asymptomatic women without postmenopausal bleeding a cutoff of 11 mm can be used prior to triggering endometrial sampling.   She has no family history concerning for genetic predisposition to cancer. Her father who had been a smoker has a history of gastric cancer.  Interval History:  She has had no abnormal bleeding or discharge.  Current Meds:  Outpatient Encounter Prescriptions as of 01/29/2015  Medication Sig  . Ascorbic Acid (VITAMIN C) 1000 MG tablet Take 1,000 mg by mouth daily before supper.   [name] aspirin 325 MG tablet Take 325 mg by mouth daily.  . cholecalciferol (VITAMIN D) 1000 UNITS tablet Take 1,000 Units by mouth at bedtime.   [name] CRANBERRY PO Take 1 capsule  by mouth 3 (three) times daily.   . DHA-EPA-VITAMIN E PO Take 1,000 mg by mouth.  . irbesartan (AVAPRO) 75 MG tablet Take 75 mg by mouth at bedtime.   [name] LORazepam (ATIVAN) 1 MG tablet Take 1 mg by mouth 2 (two) times daily.   .  Magnesium 400 MG CAPS Take 400 mg by mouth daily.  . Misc Natural Products (TURMERIC CURCUMIN) CAPS Take 1 capsule by mouth daily with lunch.   . Omega-3 Fatty Acids (FISH OIL) 1000 MG CAPS Take 1,000 mg by mouth 2 (two) times daily.  Vladimir Faster Glycol-Propyl Glycol (SYSTANE OP) Place 1 drop into both eyes 2 (two) times daily.  . propafenone (RYTHMOL) 150 MG tablet Take 150 mg by mouth 3 (three) times daily.   [name] RESVERATROL PO Take 1 capsule by mouth at bedtime.  . [DISCONTINUED] diltiazem (CARDIZEM) 30 MG tablet Take 30 mg by mouth.  . diltiazem (CARDIZEM) 30 MG tablet Take 30 mg by mouth. Take ONLY as needed for heart rate >100  . [DISCONTINUED] Magnesium Oxide 400 MG CAPS Take 400 mg by mouth.   No facility-administered encounter medications on file as of 01/29/2015.    Allergy:  Allergies  Allergen Reactions  . Augmentin [Amoxicillin-Pot Clavulanate] Hives  . Ciprofloxacin Palpitations and Other (See Comments)    "irregular heart beat"  . Diltiazem Other (See Comments)    Pulse dropped too low  . Epinephrine Other (See Comments)    "NOVOCAINE"--could be the epinephrine----patient didn't tolerate-SYNCOPE    . Other Other (See Comments)    "NOVOCAINE"--could be the epinephrine----patient didn't tolerate-SYNCOPE  . Shrimp [Shellfish Allergy] Hives  . Iohexol Itching and Rash     Code: RASH, Desc: patient states hx of possible reaction to ivp contrast years ago   . Penicillins Hives  . Zithromax [Azithromycin] Other (See Comments)    Cannot take with propafenone    Social Hx:   History   Social History  . Marital Status: Married    Spouse Name: N/A  . Number of Children: N/A  . Years of Education: N/A   Occupational History  . Not on file.   Social History Main Topics  . Smoking status: Never Smoker   . Smokeless tobacco: Not on file  . Alcohol Use: 8.4 oz/week    14 Glasses of wine per week  . Drug Use: No  . Sexual Activity: No   Other Topics Concern  . Not  on file   Social History Narrative    Past Surgical Hx:  Past Surgical History  Procedure Laterality Date  . Tonsillectomy and adenoidectomy      --age 76  . Appendectomy      age 15  . Dilation and curettage of uterus  1990's    benign polyp  . Cardiac catheterization N/A 12/15/2014    Procedure: Left Heart Cath and Coronary Angiography;  Surgeon: Burnell Blanks, MD;  Location: [facility];  Service: Cardiovascular;  Laterality: N/A;    Past Medical Hx:  Past Medical History  Diagnosis Date  . Anxiety   . Depression   . Heart murmur     functional-normal echocardiogram  . Hypertension   . Atrial tachycardia     --sees cardiologist--Dr. Adrian Prows  . Atrial fibrillation   . Facial spasm     Past Gynecological History:  G3P3, SVD Patient's last menstrual period was 08/11/1988 (approximate).  Family Hx:  Family History  Problem Relation Age of Onset  . Cancer  Father 50    dec-stomach ca  . Hypertension Mother   . Renal Disease Mother 55    dec-renal failure  . Hypertension Maternal Grandmother   . Heart attack Maternal Grandmother     Review of Systems:  Constitutional  Feels well,    ENT Normal appearing ears and nares bilaterally Skin/Breast  No rash, sores, jaundice, itching, dryness Cardiovascular  No chest pain, shortness of breath, or edema  Pulmonary  No cough or wheeze.  Gastro Intestinal  No nausea, vomitting, or diarrhoea. No bright red blood per rectum, no abdominal pain, change in bowel movement, or constipation.  Genito Urinary  No frequency, urgency, dysuria, see HPI Musculo Skeletal  No myalgia, arthralgia, joint swelling or pain  Neurologic  No weakness, numbness, change in gait,  Psychology  No depression, anxiety, insomnia.   Vitals:  Blood pressure 158/74, pulse 74, temperature 97.5 F (36.4 C), temperature source Oral, resp. rate 18, height 5' 5.5" (1.664 m), weight 159 lb 14.4 oz (72.53 kg), last menstrual  period 08/11/1988, SpO2 100 %.  Physical Exam: WD in NAD Neck  Supple NROM, without any enlargements.  Lymph Node Survey No cervical supraclavicular or inguinal adenopathy Cardiovascular  Pulse normal rate, regularity and rhythm. S1 and S2 normal.  Lungs  Clear to auscultation bilateraly, without wheezes/crackles/rhonchi. Good air movement.  Skin  No rash/lesions/breakdown  Psychiatry  Alert and oriented to person, place, and time  Abdomen  Normoactive bowel sounds, abdomen soft, non-tender and thin without evidence of hernia.  Back No CVA tenderness Genito Urinary  Vulva/vagina: Normal external female genitalia.  No lesions. No discharge or bleeding.  Bladder/urethra:  No lesions or masses, well supported bladder  Vagina: no abnormalities  Cervix: Normal appearing, no lesions.  Uterus: Small, mobile, no parametrial involvement or nodularity.  Adnexa: no palpable masses. Rectal  deferred Extremities  No bilateral cyanosis, clubbing or edema.   Donaciano Eva, MD   01/29/2015, 2:59 PM

## 2015-01-29 NOTE — Patient Instructions (Signed)
Plan to have a repeat ultrasound with Dr. Quincy Simmonds in six months.  If the findings are concerning, a biopsy may be recommended at that time.  Please call for any questions or concerns.

## 2015-01-30 ENCOUNTER — Telehealth: Payer: Self-pay

## 2015-01-30 NOTE — Telephone Encounter (Signed)
Nunzio Cobbs, MD   Sent: Tue January 30, 2015 1:18 PM    To: [facility] Triage Pool        Message             ----- Message -----     From: Everitt Amber, MD     Sent: 01/29/2015  3:16 PM      To: Nunzio Cobbs, MD        Dear Dietrich Pates,    I saw Ms Tatham today. I think it is reasonable to hold off biopsy for now. I suggested a conservative, but non invasive approach, would be to repeat the [place] in 6 months, if there is increased in thickness by >50% she is agreeable to biopsy at that time.    Reinaldo Meeker

## 2015-01-30 NOTE — Telephone Encounter (Signed)
It probably would make sense to do 6 months from the prior ultrasound. The prior ultrasound was done in September 28, 2014.  How about August 2016?  Thank you.

## 2015-01-30 NOTE — Telephone Encounter (Signed)
Left message to call Kaitlyn at [phone]. 

## 2015-01-30 NOTE — Progress Notes (Signed)
Please contact patient to schedule pelvic ultrasound in office and follow up visit with me for December 2016. She is to report any vaginal bleeding.   Patient has just had consultation with Dr. Denman George who gave this recommendation.  Thank you!

## 2015-01-30 NOTE — Telephone Encounter (Signed)
Spoke with patient. Advised of message as seen below from [place]. Patient is agreeable but has some hesitations to scheduling. "She was running a little behind yesterday and I am not sure if she saw the date on my ultrasound. I am not sure if she wants me to have it six months from my last ultrasound or from yesterday." Patient would like me to clarify with Dr.Silva before scheduling. Advised will speak with Dr.Silva and return call. Patient is agreeable.

## 2015-01-31 NOTE — Telephone Encounter (Signed)
Left message to call Katherine Cardenas at [phone]. 

## 2015-02-05 NOTE — Telephone Encounter (Signed)
Spoke with patient. Advised of message as seen below from Katherine Cardenas. Patient is agreeable. Patient declines to schedule appointment at this time. Would like to call as time gets closer so that she may ensure she is available. Will placed in imaging hold to make sure this gets scheduled.  Routing to provider for final review. Patient agreeable to disposition. Will close encounter.   Patient aware provider will review message and nurse will return call if any additional advice or change of disposition.

## 2015-02-13 DIAGNOSIS — I471 Supraventricular tachycardia: Secondary | ICD-10-CM | POA: Insufficient documentation

## 2015-02-13 DIAGNOSIS — I10 Essential (primary) hypertension: Secondary | ICD-10-CM | POA: Insufficient documentation

## 2015-02-14 DIAGNOSIS — I10 Essential (primary) hypertension: Secondary | ICD-10-CM | POA: Diagnosis not present

## 2015-02-14 DIAGNOSIS — I2510 Atherosclerotic heart disease of native coronary artery without angina pectoris: Secondary | ICD-10-CM | POA: Diagnosis not present

## 2015-02-14 DIAGNOSIS — Z6826 Body mass index (BMI) 26.0-26.9, adult: Secondary | ICD-10-CM | POA: Diagnosis not present

## 2015-02-14 DIAGNOSIS — I471 Supraventricular tachycardia: Secondary | ICD-10-CM | POA: Diagnosis not present

## 2015-02-15 ENCOUNTER — Telehealth: Payer: Self-pay | Admitting: *Deleted

## 2015-02-15 NOTE — Telephone Encounter (Signed)
Ok to discontinue order for EMB.

## 2015-02-15 NOTE — Telephone Encounter (Signed)
Endometrial biopsy ordered from 09-28-14. Patient declined to proceed with procedure and had referral to GYN/ONC on 01-29-15. Plan for follow-up ultrasound 03-2015 and determine if biopsy needed. Can previous order be completed?

## 2015-02-16 DIAGNOSIS — L300 Nummular dermatitis: Secondary | ICD-10-CM | POA: Diagnosis not present

## 2015-02-16 DIAGNOSIS — D225 Melanocytic nevi of trunk: Secondary | ICD-10-CM | POA: Diagnosis not present

## 2015-02-16 NOTE — Telephone Encounter (Signed)
Order completed. Encounter closed.  

## 2015-02-26 ENCOUNTER — Other Ambulatory Visit: Payer: Self-pay | Admitting: Nurse Practitioner

## 2015-02-26 DIAGNOSIS — M79605 Pain in left leg: Secondary | ICD-10-CM

## 2015-02-26 DIAGNOSIS — M79662 Pain in left lower leg: Secondary | ICD-10-CM | POA: Diagnosis not present

## 2015-02-27 ENCOUNTER — Ambulatory Visit
Admission: RE | Admit: 2015-02-27 | Discharge: 2015-02-27 | Disposition: A | Discharge status: Home / Self Care | Payer: Medicare Other | Source: Ambulatory Visit | Attending: Nurse Practitioner | Admitting: Nurse Practitioner

## 2015-02-27 DIAGNOSIS — M79605 Pain in left leg: Secondary | ICD-10-CM

## 2015-03-26 ENCOUNTER — Telehealth: Payer: Self-pay | Admitting: Emergency Medicine

## 2015-03-26 DIAGNOSIS — R9389 Abnormal findings on diagnostic imaging of other specified body structures: Secondary | ICD-10-CM

## 2015-03-26 NOTE — Telephone Encounter (Signed)
Contacted patient to schedule follow up ultrasound with Dr. Quincy Simmonds.  Last ultrasound with Dr. Quincy Simmonds 09/28/14.  Office visit with Dr. Denman George 01/29/15.  Patient declines to schedule at this time. She is aware of Dr. Elza Rafter prior recommendation for ultrasound in 03/2015 since that will be 6 months from prior ultrasound.  Patient would like to confirm with Dr. Denman George that ultrasound should be completed 6 months from ultrasound and not 6 months from office visit with Dr. Denman George. Ms. Voshell would like to call Dr. Serita Grit office directly to discuss. She states she will contact their office directly.   Order placed for Pelvic ultrasound to work que.  Patient states she will return call.   Update to Dr. Quincy Simmonds. Patient remains in imaging hold.  .cc Kerry Hough

## 2015-04-09 ENCOUNTER — Telehealth: Payer: Self-pay | Admitting: Emergency Medicine

## 2015-04-09 NOTE — Telephone Encounter (Signed)
Reviewed message. Patient has not called Dr. Serita Grit office. No notes in EPIC.  Called Dr. Serita Grit office and spoke with Nurse Erasmo Downer. She will review with Dr. Denman George and return call.

## 2015-04-09 NOTE — Telephone Encounter (Signed)
Called Dr. Serita Grit office and spoke with Nurse, Cyril Mourning. Requested review from Dr. Denman George regarding patient request for clarification of ultrasound necessity per Dr. Denman George. Erasmo Downer spoke with Dr. Denman George and confirms ultrasound recommended 6 months from date of last ultrasound on 09/28/14.

## 2015-04-11 NOTE — Telephone Encounter (Signed)
Call to patient, left message to call back to Sally. 

## 2015-04-17 DIAGNOSIS — Z1389 Encounter for screening for other disorder: Secondary | ICD-10-CM | POA: Diagnosis not present

## 2015-04-17 DIAGNOSIS — R1012 Left upper quadrant pain: Secondary | ICD-10-CM | POA: Diagnosis not present

## 2015-04-17 DIAGNOSIS — R202 Paresthesia of skin: Secondary | ICD-10-CM | POA: Diagnosis not present

## 2015-04-17 DIAGNOSIS — I872 Venous insufficiency (chronic) (peripheral): Secondary | ICD-10-CM | POA: Diagnosis not present

## 2015-04-17 DIAGNOSIS — Z0000 Encounter for general adult medical examination without abnormal findings: Secondary | ICD-10-CM | POA: Diagnosis not present

## 2015-04-19 NOTE — Telephone Encounter (Signed)
Message left to return call to Katherine Cardenas at [phone].    

## 2015-04-23 NOTE — Telephone Encounter (Signed)
I am closing the encounter.  Patient had been adequately advised and has seen GYN [facility] for a second opinion.

## 2015-04-23 NOTE — Telephone Encounter (Signed)
Dr. Quincy Simmonds,  Patient was due to return to office for Pelvic ultrasound 03/2015. She declined to schedule ultrasound when I called her on 03/26/15 (additional telephone encounter).  Gay Filler and I have left messages to return call.   Please advise how to proceed.

## 2015-04-24 NOTE — Telephone Encounter (Signed)
Ok to remove from imaging hold.  °

## 2015-04-24 NOTE — Telephone Encounter (Signed)
Dr. Quincy Simmonds, okay to remove from imaging hold?

## 2015-06-13 DIAGNOSIS — H25813 Combined forms of age-related cataract, bilateral: Secondary | ICD-10-CM | POA: Diagnosis not present

## 2015-06-13 DIAGNOSIS — H40013 Open angle with borderline findings, low risk, bilateral: Secondary | ICD-10-CM | POA: Diagnosis not present

## 2015-06-27 DIAGNOSIS — R300 Dysuria: Secondary | ICD-10-CM | POA: Diagnosis not present

## 2015-07-03 DIAGNOSIS — L720 Epidermal cyst: Secondary | ICD-10-CM | POA: Diagnosis not present

## 2015-07-18 DIAGNOSIS — I10 Essential (primary) hypertension: Secondary | ICD-10-CM | POA: Diagnosis not present

## 2015-07-18 DIAGNOSIS — Z23 Encounter for immunization: Secondary | ICD-10-CM | POA: Diagnosis not present

## 2015-07-18 DIAGNOSIS — I4891 Unspecified atrial fibrillation: Secondary | ICD-10-CM | POA: Diagnosis not present

## 2015-07-18 DIAGNOSIS — R21 Rash and other nonspecific skin eruption: Secondary | ICD-10-CM | POA: Diagnosis not present

## 2015-07-18 DIAGNOSIS — R109 Unspecified abdominal pain: Secondary | ICD-10-CM | POA: Diagnosis not present

## 2015-07-18 DIAGNOSIS — F419 Anxiety disorder, unspecified: Secondary | ICD-10-CM | POA: Diagnosis not present

## 2015-07-19 DIAGNOSIS — S8392XA Sprain of unspecified site of left knee, initial encounter: Secondary | ICD-10-CM | POA: Diagnosis not present

## 2015-10-16 DIAGNOSIS — I480 Paroxysmal atrial fibrillation: Secondary | ICD-10-CM | POA: Diagnosis not present

## 2015-10-16 DIAGNOSIS — I2510 Atherosclerotic heart disease of native coronary artery without angina pectoris: Secondary | ICD-10-CM | POA: Diagnosis not present

## 2015-10-16 DIAGNOSIS — I10 Essential (primary) hypertension: Secondary | ICD-10-CM | POA: Diagnosis not present

## 2015-10-16 DIAGNOSIS — Z6826 Body mass index (BMI) 26.0-26.9, adult: Secondary | ICD-10-CM | POA: Diagnosis not present

## 2015-10-16 DIAGNOSIS — I471 Supraventricular tachycardia: Secondary | ICD-10-CM | POA: Diagnosis not present

## 2015-10-17 ENCOUNTER — Other Ambulatory Visit: Payer: Self-pay | Admitting: Geriatric Medicine

## 2015-10-17 DIAGNOSIS — Z79899 Other long term (current) drug therapy: Secondary | ICD-10-CM | POA: Diagnosis not present

## 2015-10-17 DIAGNOSIS — M545 Low back pain: Secondary | ICD-10-CM

## 2015-10-17 DIAGNOSIS — N281 Cyst of kidney, acquired: Secondary | ICD-10-CM

## 2015-10-17 DIAGNOSIS — R102 Pelvic and perineal pain: Secondary | ICD-10-CM | POA: Diagnosis not present

## 2015-10-17 DIAGNOSIS — R202 Paresthesia of skin: Secondary | ICD-10-CM | POA: Diagnosis not present

## 2015-10-17 DIAGNOSIS — M549 Dorsalgia, unspecified: Secondary | ICD-10-CM | POA: Diagnosis not present

## 2015-10-17 DIAGNOSIS — R300 Dysuria: Secondary | ICD-10-CM | POA: Diagnosis not present

## 2015-10-17 DIAGNOSIS — F419 Anxiety disorder, unspecified: Secondary | ICD-10-CM | POA: Diagnosis not present

## 2015-10-17 DIAGNOSIS — I4891 Unspecified atrial fibrillation: Secondary | ICD-10-CM | POA: Diagnosis not present

## 2015-10-17 DIAGNOSIS — F329 Major depressive disorder, single episode, unspecified: Secondary | ICD-10-CM | POA: Diagnosis not present

## 2015-10-17 DIAGNOSIS — I10 Essential (primary) hypertension: Secondary | ICD-10-CM | POA: Diagnosis not present

## 2015-10-22 ENCOUNTER — Other Ambulatory Visit: Payer: Medicare Other

## 2015-10-22 ENCOUNTER — Other Ambulatory Visit: Payer: Self-pay | Admitting: Geriatric Medicine

## 2015-10-22 DIAGNOSIS — N281 Cyst of kidney, acquired: Secondary | ICD-10-CM

## 2015-10-22 DIAGNOSIS — R1012 Left upper quadrant pain: Secondary | ICD-10-CM

## 2015-10-26 ENCOUNTER — Other Ambulatory Visit: Payer: Self-pay | Admitting: Geriatric Medicine

## 2015-10-26 ENCOUNTER — Other Ambulatory Visit: Payer: Medicare Other

## 2015-10-26 ENCOUNTER — Ambulatory Visit
Admission: RE | Admit: 2015-10-26 | Discharge: 2015-10-26 | Disposition: A | Discharge status: Home / Self Care | Payer: Medicare Other | Source: Ambulatory Visit | Attending: Geriatric Medicine | Admitting: Geriatric Medicine

## 2015-10-26 DIAGNOSIS — N281 Cyst of kidney, acquired: Secondary | ICD-10-CM

## 2015-10-26 DIAGNOSIS — R1012 Left upper quadrant pain: Secondary | ICD-10-CM

## 2015-10-31 ENCOUNTER — Other Ambulatory Visit: Payer: Self-pay | Admitting: Geriatric Medicine

## 2015-10-31 DIAGNOSIS — N281 Cyst of kidney, acquired: Secondary | ICD-10-CM | POA: Diagnosis not present

## 2015-10-31 DIAGNOSIS — N390 Urinary tract infection, site not specified: Secondary | ICD-10-CM | POA: Diagnosis not present

## 2015-11-07 ENCOUNTER — Ambulatory Visit
Admission: RE | Admit: 2015-11-07 | Discharge: 2015-11-07 | Disposition: A | Discharge status: Home / Self Care | Payer: Medicare Other | Source: Ambulatory Visit | Attending: Geriatric Medicine | Admitting: Geriatric Medicine

## 2015-11-07 DIAGNOSIS — N281 Cyst of kidney, acquired: Secondary | ICD-10-CM

## 2015-11-07 MED ORDER — GADOBENATE DIMEGLUMINE 529 MG/ML IV SOLN
14.0000 mL | Freq: Once | INTRAVENOUS | Status: AC | PRN
  Administered 2015-11-07: 14 mL via INTRAVENOUS

## 2015-11-14 ENCOUNTER — Ambulatory Visit
Admission: RE | Admit: 2015-11-14 | Discharge: 2015-11-14 | Disposition: A | Discharge status: Home / Self Care | Payer: Medicare Other | Source: Ambulatory Visit | Attending: Geriatric Medicine | Admitting: Geriatric Medicine

## 2015-11-14 ENCOUNTER — Other Ambulatory Visit: Payer: Self-pay | Admitting: Geriatric Medicine

## 2015-11-14 DIAGNOSIS — R0789 Other chest pain: Secondary | ICD-10-CM

## 2015-11-14 DIAGNOSIS — R0781 Pleurodynia: Secondary | ICD-10-CM | POA: Diagnosis not present

## 2015-12-17 DIAGNOSIS — I10 Essential (primary) hypertension: Secondary | ICD-10-CM | POA: Diagnosis not present

## 2015-12-17 DIAGNOSIS — M25551 Pain in right hip: Secondary | ICD-10-CM | POA: Diagnosis not present

## 2015-12-17 DIAGNOSIS — G8929 Other chronic pain: Secondary | ICD-10-CM | POA: Diagnosis not present

## 2015-12-17 DIAGNOSIS — I4891 Unspecified atrial fibrillation: Secondary | ICD-10-CM | POA: Diagnosis not present

## 2015-12-17 DIAGNOSIS — M5442 Lumbago with sciatica, left side: Secondary | ICD-10-CM | POA: Diagnosis not present

## 2015-12-17 DIAGNOSIS — M5441 Lumbago with sciatica, right side: Secondary | ICD-10-CM | POA: Diagnosis not present

## 2015-12-17 DIAGNOSIS — M25552 Pain in left hip: Secondary | ICD-10-CM | POA: Diagnosis not present

## 2015-12-20 DIAGNOSIS — I10 Essential (primary) hypertension: Secondary | ICD-10-CM | POA: Diagnosis not present

## 2015-12-20 DIAGNOSIS — I4891 Unspecified atrial fibrillation: Secondary | ICD-10-CM | POA: Diagnosis not present

## 2015-12-28 ENCOUNTER — Other Ambulatory Visit: Payer: Medicare Other

## 2015-12-28 DIAGNOSIS — M25561 Pain in right knee: Secondary | ICD-10-CM | POA: Diagnosis not present

## 2015-12-28 DIAGNOSIS — N390 Urinary tract infection, site not specified: Secondary | ICD-10-CM | POA: Diagnosis not present

## 2016-01-17 ENCOUNTER — Telehealth: Payer: Self-pay | Admitting: Cardiovascular Disease

## 2016-01-17 NOTE — Telephone Encounter (Signed)
Spoke with patient who states she is having pain between her shoulder blades similar to episode that occurred last May.  She states she became more concerned when the pain was not better this morning.  She states equally as concerning to her is that she has no energy today and does not feel like doing the things she had planned.  She c/o tightness in lower jaw/chin area, denies chest discomfort, SOB and nausea.  She had minimal CAD on cardiac cath May 2016.  She sees Dr. Ola Spurr in [facility] for EP and states she has been in [facility] for 12 years - takes Rhythmol for atrial tachycardia.  At her last visit with Dr. Ola Spurr in March 2017 she states she discussed the back pain with him and he advised it was likely non cardiac.  She has a relatively new knee pain/injury that started 3 weeks ago and has been using a walker.  I asked her about the pain in relation to the new use of the walker and she states she has not used in the walker in 1 week yet had the most intense pain last night.  She also c/o ringing in her ears and problems with anxiety and states that she is uncertain as to whether some of her symptoms are coming from stress and anxiety or her heart.  I offered her an appointment with Richardson Dopp, PA this afternoon.  She states she would prefer to see Dr. Acie Fredrickson.  After reviewing her concerns with him, he offers to see her tomorrow morning at 9:30.  Patient verbalized understanding and agreement and thanked me for the call.

## 2016-01-17 NOTE — Telephone Encounter (Signed)
New message ( general )   No dot phrase for this.  Pt called states that she is having upper back pain again and she states that she doesn't feel as if she will need to go to the ER but Dr. Acie Fredrickson was fairly concerned about this last year so she is requesting a call back because she exhibiting some of the same symptoms from the year prior.   No other symptoms other than the back pain and not feeling fairly well. She feels as if she should do something. Last year when this happened her cardiac enzymes were high per pt. Having Anxiety attacks. She can't tell or sort the problem. Request a call back from the nurse.

## 2016-01-18 ENCOUNTER — Encounter: Payer: Self-pay | Admitting: Cardiovascular Disease

## 2016-01-18 ENCOUNTER — Ambulatory Visit (INDEPENDENT_AMBULATORY_CARE_PROVIDER_SITE_OTHER): Payer: Medicare Other | Admitting: Cardiovascular Disease

## 2016-01-18 VITALS — BP 126/62 | HR 62 | Ht 65.5 in | Wt 147.0 lb

## 2016-01-18 DIAGNOSIS — E785 Hyperlipidemia, unspecified: Secondary | ICD-10-CM | POA: Insufficient documentation

## 2016-01-18 LAB — COMPREHENSIVE METABOLIC PANEL
ALT: 18 U/L (ref 6–29)
AST: 26 U/L (ref 10–35)
Albumin: 4.3 g/dL (ref 3.6–5.1)
Alkaline Phosphatase: 60 U/L (ref 33–130)
BILIRUBIN TOTAL: 1.1 mg/dL (ref 0.2–1.2)
BUN: 12 mg/dL (ref 7–25)
CALCIUM: 9.1 mg/dL (ref 8.6–10.4)
CO2: 27 mmol/L (ref 20–31)
Chloride: 101 mmol/L (ref 98–110)
Creat: 0.93 mg/dL (ref 0.60–0.93)
GLUCOSE: 97 mg/dL (ref 65–99)
Potassium: 4.3 mmol/L (ref 3.5–5.3)
SODIUM: 138 mmol/L (ref 135–146)
Total Protein: 7 g/dL (ref 6.1–8.1)

## 2016-01-18 LAB — LIPID PANEL
Cholesterol: 194 mg/dL (ref 125–200)
HDL: 80 mg/dL (ref >=46)
LDL CALC: 105 mg/dL (ref <130)
Total CHOL/HDL Ratio: 2.4 Ratio (ref <=5.0)
Triglycerides: 44 mg/dL (ref <150)
VLDL: 9 mg/dL (ref <30)

## 2016-01-18 NOTE — Patient Instructions (Addendum)
Physical therapist Bjorn Loser  [id number]   Medication Instructions:  Same-no changes  Labwork: CMET and Lipids today  Testing/Procedures: None  Follow-Up: Your physician wants you to follow-up in: 1 year. You will receive a reminder letter in the mail two months in advance. If you don't receive a letter, please call our office to schedule the follow-up appointment.     If you need a refill on your cardiac medications before your next appointment, please call your pharmacy.

## 2016-01-18 NOTE — Progress Notes (Signed)
Cardiology Office Note   Date:  01/18/2016   ID:  Katherine Cardenas, DOB 1940-11-18, MRN [id number]  PCP:  Mathews Argyle, MD  Cardiologist:   Mertie Moores, MD   Chief Complaint  Patient presents with  . Follow-up    chest pain    Problem List 1. Viral URI 2. NSTEMI  3. Atrial fib - followed by Dr. Ola Spurr  4, Essential HTN    History of Present Illness: Katherine Cardenas is a 75 y.o. female who presents for follow up of an episode of CP . She work up during the night on Tuesday -  Upper back . Fairly severe .  Across her back .  Tried a heating pad. Lasted for 2-3 hours. Went back to sleep . Woke up in the am without any back pain . Saw Dr. Carlyle Lipa PA the next am.   Martin Majestic to the ER.  Serial troponin levels were constat, did not increase  ECG showed NS changes.   Troponin level = 0.08. Has some jaw, teeth and upper jaw pain on occasion .  Thinks it's TMJ disease  Very active , works in the garden.  Does Tai Chi.  No further episodes of pain - its been raining so no yard work .    January 12, 2015:   She had a cardiac cath that showed mild CAD.  Her LV function was normal but there was a subtle inferior wall motion defect - she may have had coronary spasm vs. Blockage of a tiny branch.   Its also possible that she had a viral cardiomyopathy with subsequent tirvial Troponin increase and a subtle wall motion abn.   January 18, 2016:; Katherine Cardenas is seen back today for follow up visit. Has some back pain / pressure.   The pain has resolved .   Yesterday , she felt very poorly .  Had lots of fatigue and had trouble getting anything done .  Has had some depression and anxiety in the past.  Feels better today  Had some angioedema with the Irbastartan  - was switched to amlodipine .  BP log  looks good   Past Medical History  Diagnosis Date  . Anxiety   . Depression   . Heart murmur     functional-normal echocardiogram  . Hypertension   . Atrial tachycardia [facility])     --sees  cardiologist--Dr. Adrian Prows  . Atrial fibrillation ([place])   . Facial spasm     Past Surgical History  Procedure Laterality Date  . Tonsillectomy and adenoidectomy      --age 51  . Appendectomy      age 85  . Dilation and curettage of uterus  1990's    benign polyp  . Cardiac catheterization N/A 12/15/2014    Procedure: Left Heart Cath and Coronary Angiography;  Surgeon: Burnell Blanks, MD;  Location: [place] [facility];  Service: Cardiovascular;  Laterality: N/A;     Current Outpatient Prescriptions  Medication Sig Dispense Refill  . amLODipine (NORVASC) 2.5 MG tablet Take 2.5 mg by mouth at bedtime.  6  . Ascorbic Acid (VITAMIN C) 1000 MG tablet Take 1,000 mg by mouth daily before supper.     [name] aspirin 325 MG tablet Take 325 mg by mouth daily.    . cholecalciferol (VITAMIN D) 1000 UNITS tablet Take 1,000 Units by mouth at bedtime.     [name] CRANBERRY PO Take 1 capsule by mouth 3 (three) times daily.     [name]  DHA-EPA-VITAMIN E PO Take 1,000 mg by mouth.    . diltiazem (CARDIZEM) 30 MG tablet Take 30 mg by mouth. Take ONLY as needed for heart rate >100    . LORazepam (ATIVAN) 1 MG tablet Take 1 mg by mouth 2 (two) times daily.     . Magnesium 400 MG CAPS Take 400 mg by mouth daily.    . Misc Natural Products (TURMERIC CURCUMIN) CAPS Take 1 capsule by mouth daily with lunch.     . Omega-3 Fatty Acids (FISH OIL) 1000 MG CAPS Take 1,000 mg by mouth 2 (two) times daily.    Vladimir Faster Glycol-Propyl Glycol (SYSTANE OP) Place 1 drop into both eyes 2 (two) times daily.    . propafenone (RYTHMOL) 150 MG tablet Take 150 mg by mouth 3 (three) times daily.     [name] RESVERATROL PO Take 1 capsule by mouth at bedtime.     No current facility-administered medications for this visit.    Allergies:   Augmentin; Ciprofloxacin; Diltiazem; Epinephrine; Other; Avapro; Shrimp; Iohexol; Penicillins; and Zithromax    Social History:  The patient  reports that she has never smoked. She does not have  any smokeless tobacco history on file. She reports that she drinks about 8.4 oz of alcohol per week. She reports that she does not use illicit drugs.   Family History:  The patient's family history includes Cancer (age of onset: 92) in her father; Heart attack in her maternal grandmother; Hypertension in her maternal grandmother and mother; Renal Disease (age of onset: 78) in her mother.    ROS:  Please see the history of present illness.    Review of Systems: Constitutional:  denies fever, chills, diaphoresis, appetite change and fatigue.  HEENT: denies photophobia, eye pain, redness, hearing loss, ear pain, congestion, sore throat, rhinorrhea, sneezing, neck pain, neck stiffness and tinnitus.  Respiratory: denies SOB, DOE, cough, chest tightness, and wheezing.  Cardiovascular: denies chest pain, palpitations and leg swelling.  Gastrointestinal: denies nausea, vomiting, abdominal pain, diarrhea, constipation, blood in stool.  Genitourinary: denies dysuria, urgency, frequency, hematuria, flank pain and difficulty urinating.  Musculoskeletal: denies  myalgias, back pain, joint swelling, arthralgias and gait problem.   Skin: denies pallor, rash and wound.  Neurological: denies dizziness, seizures, syncope, weakness, light-headedness, numbness and headaches.   Hematological: denies adenopathy, easy bruising, personal or family bleeding history.  Psychiatric/ Behavioral: denies suicidal ideation, mood changes, confusion, nervousness, sleep disturbance and agitation.       All other systems are reviewed and negative.    PHYSICAL EXAM: VS:  BP 126/62 mmHg  Pulse 62  Ht 5' 5.5" (1.664 m)  Wt 147 lb (66.679 kg)  BMI 24.08 kg/m2  LMP 08/11/1988 (Approximate) , BMI Body mass index is 24.08 kg/(m^2). GEN: Well nourished, well developed, in no acute distress HEENT: normal Neck: no JVD, carotid bruits, or masses Cardiac: RRR; no murmurs, rubs, or gallops,no edema  Respiratory:  clear to  auscultation bilaterally, normal work of breathing GI: soft, nontender, nondistended, + BS MS: no deformity or atrophy Skin: warm and dry, no rash Neuro:  Strength and sensation are intact Psych: normal   EKG:  EKG is ordered today. NSR at 62.   LAE.  Inc. RBBB , TIW in the inferior leads.  no changes from previous    Recent Labs: No results found for requested labs within last 365 days.    Lipid Panel    Component Value Date/Time   CHOL 204* 01/12/2015 1059  TRIG 61.0 01/12/2015 1059   HDL 71.50 01/12/2015 1059   CHOLHDL 3 01/12/2015 1059   VLDL 12.2 01/12/2015 1059   LDLCALC 120* 01/12/2015 1059      Wt Readings from Last 3 Encounters:  01/18/16 147 lb (66.679 kg)  01/29/15 159 lb 14.4 oz (72.53 kg)  01/12/15 162 lb 9.6 oz (73.755 kg)      Other studies Reviewed: Additional studies/ records that were reviewed today include: . Review of the above records demonstrates:    ASSESSMENT AND PLAN:  1.  CAD   . He was seen last year for some episodes of chest discomfort.  She was found have elevated troponin levels. Cardiac catheterization was essentially normal. She was found have very minimal coronary artery disease. She did have a subtle wall motion abnormalities so it was thought that she may have had some myocarditis or perhaps occluded a very small branch that was not seen at cath.  2. Hyperlipidemia: Her lipid levels were mildly elevated last . We'll recheck her lipids today is along with this complete medical profile. I would have a low threshold to start her on Crestor 5 kg 2 or 3 times a week if her lipid levels are still elevated.    Current medicines are reviewed at length with the patient today.  The patient does not have concerns regarding medicines.  The following changes have been made:  no change  Labs/ tests ordered today include:   Orders Placed This Encounter  Procedures  . Comp Met (CMET)  . Lipid Profile  . EKG 12-Lead     Disposition:    Follow up with me in 1 years.      Mertie Moores, MD  01/18/2016 10:49 AM    [facility] [address], [place], [place]  [address] Phone: [phone]; Fax: [phone]

## 2016-01-21 ENCOUNTER — Telehealth: Payer: Self-pay | Admitting: Nurse Practitioner

## 2016-01-21 DIAGNOSIS — E785 Hyperlipidemia, unspecified: Secondary | ICD-10-CM

## 2016-01-21 MED ORDER — ROSUVASTATIN CALCIUM 5 MG PO TABS: 5.0000 mg | ORAL_TABLET | ORAL | Status: DC

## 2016-01-21 NOTE — Telephone Encounter (Signed)
-----   Message from Thayer Headings, MD sent at 01/18/2016  3:14 PM EDT ----- cmet is stable. Lipids look better than last year.   I would like to start her on Crestor 5 mg twice a week - she does have some CAD ( fairly minimal )

## 2016-01-21 NOTE — Telephone Encounter (Signed)
Reviewed lab results and plan of care with patient.  She would like to start Crestor 5 mg at once a week and then if she does not notice any side effects she will increase to twice weekly.  I advised her that we will recheck fasting lab work in 3 months to see how she is doing.  She is scheduled for lab appointment on 9/14.  I advised her to call back with questions or concerns and she verbalized understanding.

## 2016-02-07 ENCOUNTER — Ambulatory Visit: Payer: Medicare Other | Admitting: Cardiovascular Disease

## 2016-04-24 ENCOUNTER — Other Ambulatory Visit: Payer: Medicare Other

## 2016-04-24 DIAGNOSIS — R269 Unspecified abnormalities of gait and mobility: Secondary | ICD-10-CM | POA: Diagnosis not present

## 2016-04-24 DIAGNOSIS — I10 Essential (primary) hypertension: Secondary | ICD-10-CM | POA: Diagnosis not present

## 2016-04-24 DIAGNOSIS — F419 Anxiety disorder, unspecified: Secondary | ICD-10-CM | POA: Diagnosis not present

## 2016-04-24 DIAGNOSIS — Z780 Asymptomatic menopausal state: Secondary | ICD-10-CM | POA: Diagnosis not present

## 2016-04-24 DIAGNOSIS — I4891 Unspecified atrial fibrillation: Secondary | ICD-10-CM | POA: Diagnosis not present

## 2016-04-24 DIAGNOSIS — F325 Major depressive disorder, single episode, in full remission: Secondary | ICD-10-CM | POA: Diagnosis not present

## 2016-04-24 DIAGNOSIS — Z0000 Encounter for general adult medical examination without abnormal findings: Secondary | ICD-10-CM | POA: Diagnosis not present

## 2016-05-13 DIAGNOSIS — Z780 Asymptomatic menopausal state: Secondary | ICD-10-CM | POA: Diagnosis not present

## 2016-05-13 DIAGNOSIS — M810 Age-related osteoporosis without current pathological fracture: Secondary | ICD-10-CM | POA: Diagnosis not present

## 2016-06-04 DIAGNOSIS — Z23 Encounter for immunization: Secondary | ICD-10-CM | POA: Diagnosis not present

## 2016-06-12 DIAGNOSIS — Z9181 History of falling: Secondary | ICD-10-CM | POA: Diagnosis not present

## 2016-06-12 DIAGNOSIS — R269 Unspecified abnormalities of gait and mobility: Secondary | ICD-10-CM | POA: Diagnosis not present

## 2016-06-17 DIAGNOSIS — H40013 Open angle with borderline findings, low risk, bilateral: Secondary | ICD-10-CM | POA: Diagnosis not present

## 2016-06-17 DIAGNOSIS — H5703 Miosis: Secondary | ICD-10-CM | POA: Diagnosis not present

## 2016-06-17 DIAGNOSIS — H2513 Age-related nuclear cataract, bilateral: Secondary | ICD-10-CM | POA: Diagnosis not present

## 2016-06-18 DIAGNOSIS — Z9181 History of falling: Secondary | ICD-10-CM | POA: Diagnosis not present

## 2016-06-18 DIAGNOSIS — R269 Unspecified abnormalities of gait and mobility: Secondary | ICD-10-CM | POA: Diagnosis not present

## 2016-06-20 DIAGNOSIS — Z9181 History of falling: Secondary | ICD-10-CM | POA: Diagnosis not present

## 2016-06-20 DIAGNOSIS — R269 Unspecified abnormalities of gait and mobility: Secondary | ICD-10-CM | POA: Diagnosis not present

## 2016-06-23 DIAGNOSIS — Z9181 History of falling: Secondary | ICD-10-CM | POA: Diagnosis not present

## 2016-06-23 DIAGNOSIS — R269 Unspecified abnormalities of gait and mobility: Secondary | ICD-10-CM | POA: Diagnosis not present

## 2016-07-01 DIAGNOSIS — Z9181 History of falling: Secondary | ICD-10-CM | POA: Diagnosis not present

## 2016-07-01 DIAGNOSIS — R269 Unspecified abnormalities of gait and mobility: Secondary | ICD-10-CM | POA: Diagnosis not present

## 2016-07-08 DIAGNOSIS — R269 Unspecified abnormalities of gait and mobility: Secondary | ICD-10-CM | POA: Diagnosis not present

## 2016-07-08 DIAGNOSIS — Z9181 History of falling: Secondary | ICD-10-CM | POA: Diagnosis not present

## 2016-07-14 DIAGNOSIS — R269 Unspecified abnormalities of gait and mobility: Secondary | ICD-10-CM | POA: Diagnosis not present

## 2016-07-14 DIAGNOSIS — Z9181 History of falling: Secondary | ICD-10-CM | POA: Diagnosis not present

## 2016-07-21 DIAGNOSIS — Z9181 History of falling: Secondary | ICD-10-CM | POA: Diagnosis not present

## 2016-07-21 DIAGNOSIS — R269 Unspecified abnormalities of gait and mobility: Secondary | ICD-10-CM | POA: Diagnosis not present

## 2016-08-20 DIAGNOSIS — H40013 Open angle with borderline findings, low risk, bilateral: Secondary | ICD-10-CM | POA: Diagnosis not present

## 2016-08-20 DIAGNOSIS — H2513 Age-related nuclear cataract, bilateral: Secondary | ICD-10-CM | POA: Diagnosis not present

## 2016-08-20 DIAGNOSIS — H5712 Ocular pain, left eye: Secondary | ICD-10-CM | POA: Diagnosis not present

## 2016-08-25 DIAGNOSIS — N281 Cyst of kidney, acquired: Secondary | ICD-10-CM | POA: Diagnosis not present

## 2016-08-25 DIAGNOSIS — I10 Essential (primary) hypertension: Secondary | ICD-10-CM | POA: Diagnosis not present

## 2016-08-25 DIAGNOSIS — I4891 Unspecified atrial fibrillation: Secondary | ICD-10-CM | POA: Diagnosis not present

## 2016-08-25 DIAGNOSIS — M810 Age-related osteoporosis without current pathological fracture: Secondary | ICD-10-CM | POA: Diagnosis not present

## 2016-08-25 DIAGNOSIS — F330 Major depressive disorder, recurrent, mild: Secondary | ICD-10-CM | POA: Diagnosis not present

## 2016-09-04 DIAGNOSIS — R350 Frequency of micturition: Secondary | ICD-10-CM | POA: Diagnosis not present

## 2016-09-04 DIAGNOSIS — R6883 Chills (without fever): Secondary | ICD-10-CM | POA: Diagnosis not present

## 2016-09-22 DIAGNOSIS — H2511 Age-related nuclear cataract, right eye: Secondary | ICD-10-CM | POA: Diagnosis not present

## 2016-09-25 ENCOUNTER — Emergency Department (HOSPITAL_COMMUNITY): Payer: Medicare Other

## 2016-09-25 ENCOUNTER — Other Ambulatory Visit: Payer: Self-pay

## 2016-09-25 ENCOUNTER — Telehealth: Payer: Self-pay | Admitting: Cardiovascular Disease

## 2016-09-25 ENCOUNTER — Emergency Department (HOSPITAL_COMMUNITY)
Admission: EM | Admit: 2016-09-25 | Discharge: 2016-09-25 | Disposition: A | Discharge status: Home / Self Care | Payer: Medicare Other | Attending: Emergency Medicine | Admitting: Emergency Medicine

## 2016-09-25 ENCOUNTER — Encounter (HOSPITAL_COMMUNITY): Payer: Self-pay

## 2016-09-25 DIAGNOSIS — Z7982 Long term (current) use of aspirin: Secondary | ICD-10-CM | POA: Diagnosis not present

## 2016-09-25 DIAGNOSIS — I10 Essential (primary) hypertension: Secondary | ICD-10-CM | POA: Insufficient documentation

## 2016-09-25 DIAGNOSIS — I2510 Atherosclerotic heart disease of native coronary artery without angina pectoris: Secondary | ICD-10-CM | POA: Insufficient documentation

## 2016-09-25 DIAGNOSIS — Z79899 Other long term (current) drug therapy: Secondary | ICD-10-CM | POA: Diagnosis not present

## 2016-09-25 DIAGNOSIS — M62830 Muscle spasm of back: Secondary | ICD-10-CM | POA: Insufficient documentation

## 2016-09-25 DIAGNOSIS — M546 Pain in thoracic spine: Secondary | ICD-10-CM | POA: Diagnosis present

## 2016-09-25 DIAGNOSIS — J9811 Atelectasis: Secondary | ICD-10-CM | POA: Diagnosis not present

## 2016-09-25 LAB — BASIC METABOLIC PANEL
ANION GAP: 10 (ref 5–15)
BUN: 12 mg/dL (ref 6–20)
CO2: 28 mmol/L (ref 22–32)
Calcium: 9.4 mg/dL (ref 8.9–10.3)
Chloride: 100 mmol/L — ABNORMAL LOW (ref 101–111)
Creatinine, Ser: 0.84 mg/dL (ref 0.44–1.00)
GFR calc non Af Amer: >60 mL/min (ref >60)
GLUCOSE: 104 mg/dL — AB (ref 65–99)
Potassium: 4.1 mmol/L (ref 3.5–5.1)
SODIUM: 138 mmol/L (ref 135–145)

## 2016-09-25 LAB — CBC
HCT: 44.6 % (ref 36.0–46.0)
HEMOGLOBIN: 14.6 g/dL (ref 12.0–15.0)
MCH: 30.6 pg (ref 26.0–34.0)
MCHC: 32.7 g/dL (ref 30.0–36.0)
MCV: 93.5 fL (ref 78.0–100.0)
Platelets: 215 10*3/uL (ref 150–400)
RBC: 4.77 MIL/uL (ref 3.87–5.11)
RDW: 13.2 % (ref 11.5–15.5)
WBC: 6.1 10*3/uL (ref 4.0–10.5)

## 2016-09-25 LAB — TROPONIN I
Troponin I: <0.03 ng/mL (ref <0.03)
Troponin I: <0.03 ng/mL (ref <0.03)

## 2016-09-25 LAB — D-DIMER, QUANTITATIVE: D-Dimer, Quant: 0.33 ug/mL-FEU (ref 0.00–0.50)

## 2016-09-25 MED ORDER — METHOCARBAMOL 500 MG PO TABS: 500.0000 mg | ORAL_TABLET | Freq: Three times a day (TID) | ORAL | 0 refills | Status: DC | PRN

## 2016-09-25 MED ORDER — ISOSORBIDE MONONITRATE ER 30 MG PO TB24: 30.0000 mg | ORAL_TABLET | Freq: Every day | ORAL | 11 refills | Status: DC

## 2016-09-25 NOTE — Telephone Encounter (Signed)
New message     Pt stating she had pain in her back between her shoulders. It radiated down both arms and had some pain in her jaw.   Pt c/o of Chest Pain: STAT if CP now or developed within 24 hours  1. Are you having CP right now? no  2. Are you experiencing any other symptoms (ex. SOB, nausea, vomiting, sweating)? no  3. How long have you been experiencing CP? Last night around 2-3   4. Is your CP continuous or coming and going? continuous  5. Have you taken Nitroglycerin? Yes, just one last night. ?

## 2016-09-25 NOTE — ED Notes (Signed)
MD at bedside. 

## 2016-09-25 NOTE — ED Provider Notes (Signed)
[name] DEPT Provider Note   CSN: [id number] Arrival date & time: 09/25/16  1527     History   Chief Complaint Chief Complaint  Patient presents with  . Back Pain    HPI Katherine Cardenas is a 76 y.o. female.  HPI Patient presents with thoracic back pain starting yesterday evening. States it radiated to both arms. Resolved spontaneously. Denied chest pain, nausea or vomiting. No diaphoresis. No recent cough or fever. Patient then had another episode of thoracic back pain this afternoon around 2 PM. States the pain is improved significantly though worsened with movement or palpation. No recent extended travel. Denies any new lower extremity swelling or pain. Recent cataract surgery. Past Medical History:  Diagnosis Date  . Anxiety   . Atrial fibrillation ([place])   . Atrial tachycardia [facility])    --sees cardiologist--Dr. Adrian Prows  . Depression   . Elevated troponin I level   . Facial spasm   . Heart murmur    functional-normal echocardiogram  . Hypertension     Patient Active Problem List   Diagnosis Date Noted  . Hyperlipidemia 01/18/2016  . CAD (coronary artery disease) 12/15/2014    Past Surgical History:  Procedure Laterality Date  . APPENDECTOMY     age 7  . CARDIAC CATHETERIZATION N/A 12/15/2014   Procedure: Left Heart Cath and Coronary Angiography;  Surgeon: Burnell Blanks, MD;  Location: [place] [facility];  Service: Cardiovascular;  Laterality: N/A;  . DILATION AND CURETTAGE OF UTERUS  1990's   benign polyp  . TONSILLECTOMY AND ADENOIDECTOMY     --age 52    OB History    Gravida Para Term Preterm AB Living   3 3 3     3    SAB TAB Ectopic Multiple Live Births                   Home Medications    Prior to Admission medications   Medication Sig Start Date End Date Taking? Authorizing Provider  amLODipine (NORVASC) 2.5 MG tablet Take 2.5 mg by mouth at bedtime. 12/20/15  Yes Historical Provider, MD  Ascorbic Acid (VITAMIN C) 1000 MG  tablet Take 1,000 mg by mouth daily before supper.    Yes Historical Provider, MD  aspirin 325 MG tablet Take 325 mg by mouth daily.   Yes Historical Provider, MD  cholecalciferol (VITAMIN D) 1000 UNITS tablet Take 1,000 Units by mouth at bedtime.    Yes Historical Provider, MD  diltiazem (CARDIZEM) 30 MG tablet Take 30 mg by mouth. Take ONLY as needed for heart rate >100   Yes Historical Provider, MD  ketorolac (ACULAR) 0.4 % SOLN Place 1 drop into the right eye 4 (four) times daily. 07/21/16  Yes Historical Provider, MD  LORazepam (ATIVAN) 1 MG tablet Take 1 mg by mouth 2 (two) times daily.    Yes Historical Provider, MD  Magnesium 400 MG CAPS Take 400 mg by mouth daily.   Yes Historical Provider, MD  Misc Natural Products (TURMERIC CURCUMIN) CAPS Take 1 capsule by mouth daily with lunch.    Yes Historical Provider, MD  nitroGLYCERIN (NITROSTAT) 0.4 MG SL tablet Place 0.4 mg under the tongue every 5 (five) minutes x 3 doses as needed for chest pain.   Yes Historical Provider, MD  Omega-3 1400 MG CAPS Take 1,400 mg by mouth daily.   Yes Historical Provider, MD  Polyethyl Glycol-Propyl Glycol (SYSTANE OP) Place 1 drop into both eyes 2 (two) times daily.  Yes Historical Provider, MD  prednisoLONE acetate (PRED FORTE) 1 % ophthalmic suspension Place 1 drop into the right eye 4 (four) times daily. 08/18/16  Yes Historical Provider, MD  propafenone (RYTHMOL) 150 MG tablet Take 150 mg by mouth 3 (three) times daily.    Yes Historical Provider, MD  RESVERATROL PO Take 1 capsule by mouth at bedtime.   Yes Historical Provider, MD  tobramycin (TOBREX) 0.3 % ophthalmic solution Place 1 drop into the right eye 4 (four) times daily. 07/21/16  Yes Historical Provider, MD  isosorbide mononitrate (IMDUR) 30 MG 24 hr tablet Take 1 tablet (30 mg total) by mouth daily. 09/25/16 09/25/17  Thayer Headings, MD  methocarbamol (ROBAXIN) 500 MG tablet Take 1 tablet (500 mg total) by mouth every 8 (eight) hours as needed.  09/25/16   Julianne Rice, MD  rosuvastatin (CRESTOR) 5 MG tablet Take 1 tablet (5 mg total) by mouth 2 (two) times a week. Patient not taking: Reported on 09/25/2016 01/21/16   Thayer Headings, MD    Family History Family History  Problem Relation Age of Onset  . Cancer Father 35    dec-stomach ca  . Hypertension Mother   . Renal Disease Mother 25    dec-renal failure  . Hypertension Maternal Grandmother   . Heart attack Maternal Grandmother     Social History Social History  Substance Use Topics  . Smoking status: Never Smoker  . Smokeless tobacco: Not on file  . Alcohol use 8.4 oz/week    14 Glasses of wine per week     Allergies   Augmentin [amoxicillin-pot clavulanate]; Ciprofloxacin; Diltiazem; Epinephrine; Novocain [procaine]; Avapro [irbesartan]; Shrimp [shellfish allergy]; Iohexol; Penicillins; and Zithromax [azithromycin]   Review of Systems Review of Systems  Constitutional: Negative for chills and fever.  HENT: Negative for congestion, rhinorrhea, sinus pain and sore throat.   Respiratory: Negative for cough and shortness of breath.   Cardiovascular: Negative for chest pain, palpitations and leg swelling.  Gastrointestinal: Negative for abdominal pain, constipation, diarrhea, nausea and vomiting.  Genitourinary: Negative for dysuria, flank pain and frequency.  Musculoskeletal: Positive for back pain. Negative for arthralgias, neck pain and neck stiffness.  Skin: Negative for rash and wound.  Neurological: Positive for numbness. Negative for dizziness, weakness, light-headedness and headaches.  All other systems reviewed and are negative.    Physical Exam Updated Vital Signs BP 158/74 (BP Location: Right Arm)   Pulse 67   Temp 98.6 F (37 C) (Oral)   Resp 18   Ht 5\' 5"  (1.651 m)   Wt 155 lb (70.3 kg)   LMP 08/11/1988 (Approximate)   SpO2 99%   BMI 25.79 kg/m   Physical Exam  Constitutional: She is oriented to person, place, and time. She appears  well-developed and well-nourished. No distress.  HENT:  Head: Normocephalic and atraumatic.  Mouth/Throat: Oropharynx is clear and moist. No oropharyngeal exudate.  Eyes: EOM are normal. Pupils are equal, round, and reactive to light.  Neck: Normal range of motion. Neck supple. No JVD present.  Cardiovascular: Normal rate and regular rhythm.  Exam reveals no gallop and no friction rub.   No murmur heard. Pulmonary/Chest: Effort normal and breath sounds normal. No respiratory distress. She has no wheezes. She has no rales. She exhibits no tenderness.  Abdominal: Soft. Bowel sounds are normal. There is no tenderness. There is no rebound and no guarding.  Musculoskeletal: Normal range of motion. She exhibits tenderness. She exhibits no edema.  Patient with diffuse thoracic  back tenderness mostly in the left trapezius and left thoracic paraspinal muscles. No midline thoracic or lumbar tenderness. No obvious deformity. Mild bilateral nonpitting lower extremity edema. No calf tenderness or asymmetry. Distal pulses intact.  Lymphadenopathy:    She has no cervical adenopathy.  Neurological: She is alert and oriented to person, place, and time.  5/5 motor in all extremities. Sensation fully intact.  Skin: Skin is warm and dry. Capillary refill takes less than 2 seconds. No rash noted. No erythema.  Psychiatric: She has a normal mood and affect. Her behavior is normal.  Nursing note and vitals reviewed.    ED Treatments / Results  Labs (all labs ordered are listed, but only abnormal results are displayed) Labs Reviewed  BASIC METABOLIC PANEL - Abnormal; Notable for the following:       Result Value   Chloride 100 (*)    Glucose, Bld 104 (*)    All other components within normal limits  CBC  TROPONIN I  TROPONIN I  D-DIMER, QUANTITATIVE (NOT AT [facility])    EKG  EKG Interpretation None       Radiology Dg Chest 2 View  Result Date: 09/25/2016 CLINICAL DATA:  Back pain. EXAM: CHEST  2  VIEW COMPARISON:  11/14/2015. FINDINGS: Mediastinum and hilar structures normal. Cardiomegaly with normal pulmonary vascularity. Mild basilar subsegmental atelectasis No pleural effusion or pneumothorax. No acute bony abnormality. IMPRESSION: 1.  Mild basilar subsegmental atelectasis. 2.  Stable mild cardiomegaly.  No pulmonary venous congestion. Electronically Signed   By: [name]  Register   On: 09/25/2016 16:38    Procedures Procedures (including critical care time)  Medications Ordered in ED Medications - No data to display   Initial Impression / Assessment and Plan / ED Course  I have reviewed the triage vital signs and the nursing notes.  Pertinent labs & imaging results that were available during my care of the patient were reviewed by me and considered in my medical decision making (see chart for details).    Troponin 2 is normal. D-dimer is normal. This likely is musculoskeletal in origin. Reproduced with palpation. Low suspicion for aortic or CAD pathology. Will prescribe muscle relaxant and advise heat. Return precautions given.   Final Clinical Impressions(s) / ED Diagnoses   Final diagnoses:  Spasm of thoracic back muscle    New Prescriptions New Prescriptions   METHOCARBAMOL (ROBAXIN) 500 MG TABLET    Take 1 tablet (500 mg total) by mouth every 8 (eight) hours as needed.     Julianne Rice, MD 09/25/16 2115

## 2016-09-25 NOTE — Telephone Encounter (Addendum)
**Note De-Identified Katherine Cardenas Obfuscation** The pt states that she was woke up during the night with pain between her shoulders, down both arms and jaw pain. She reports that her BP at that time was 140/70 with a HR of 60 bpm. She describes the pain as the same pain she had prior to her cath on 12/15/14. She denies SOB, sweating or nausea at that time. She states that she took 1 NTG tablet and it was effective. She states that her chest, arms and jaw pain is now gone. She reports that her only complant at this time is fatigue.  I spoke with Dr Marlou Porch (DOD) who recommended that the pt start taking Imdur 30 mg daily and to be seen by Dr Acie Fredrickson or one of his team members next week and that if her symptoms return in the meantime to call us back or call 911 or have someone drive her to the ER.  I advised the pt of Dr Marlou Porch recommendations. She agreed to start taking Imdur 30 mg daily but refused to see a PA or NP and insisted on the next available appt with Dr Acie Fredrickson. I encouraged her to f/u next week on 2/21 with [facility], [facility], as recommended by Dr Marlou Porch, but again she declined that appt. I scheduled her a f/u with Dr Acie Fredrickson on 5/1 and she accepted that appt and then asked to have the appt with Robbie Lis, [place] on 2/21. I explained to her that she does not need both appts and that her f/u with Dr Acie Fredrickson could be discussed at her f/u on 2/21 with Robbie Lis, PA. She continues to ask a lot of questions concerning her f/u with Vin vs Dr Acie Fredrickson and that she wants continuity   of her care.  She does not want to schedule an appt at this time and is requesting a call from Dr Elmarie Shiley nurse when she returns to the office to either schedule her a f/u or to get advise from Dr Acie Fredrickson on when she should be seen. Phone call lasted 40+ mins. Will forward message to Sharyn Lull, Dr Lanny Hurst nurse.

## 2016-09-25 NOTE — ED Triage Notes (Signed)
Pt endorses back pain between the shoulder blades radiating down both arms last night and a recurrence this afternoon, pt took nitro during the night with relief. pt was told by Cardiologist to come here due to previous elevated troponin. Pt had cardiac cath done and it was negative but pt was told she had a small heart attack. Pt hypertensive in triage. Pt denies chest pain and had EKG completed at home by EMS.

## 2016-09-25 NOTE — ED Notes (Signed)
Patient verbalized understanding of discharge instructions and denies any further needs or questions at this time. VS stable. Patient ambulatory with steady gait.  

## 2016-09-26 DIAGNOSIS — I10 Essential (primary) hypertension: Secondary | ICD-10-CM | POA: Diagnosis not present

## 2016-09-26 DIAGNOSIS — M546 Pain in thoracic spine: Secondary | ICD-10-CM | POA: Diagnosis not present

## 2016-09-26 DIAGNOSIS — R030 Elevated blood-pressure reading, without diagnosis of hypertension: Secondary | ICD-10-CM | POA: Diagnosis not present

## 2016-09-30 NOTE — Telephone Encounter (Signed)
Spoke with patient who states she had pain in her back that radiated down both arms last week. She states the back pain returned later in the day after talking to one of the nurses at our office and she went to the ER. She was advised that there was no indication of cardiac involvement and she was sent home. We discussed her current state and she states she feels good about the diagnosis given in the ER. She states she did not pick up Imdur at the pharmacy due to the fact that the pain was not likely caused by her heart. I offered to schedule her to see Dr. Acie Fredrickson soon so that she could discuss this with him but that I agree that she does not have to start Imdur at this time. She states she prefers to wait for May to see the doctor since she is due for 1 year follow-up in June. I scheduled her with Dr. Acie Fredrickson on May 1 and advised her that if symptoms worsen or if other concerns develop to call back for sooner appointment. She verbalized understanding and agreement with plan and thanked me for the call.

## 2016-12-06 ENCOUNTER — Encounter (HOSPITAL_COMMUNITY): Payer: Self-pay

## 2016-12-06 ENCOUNTER — Emergency Department (HOSPITAL_COMMUNITY): Payer: Medicare Other

## 2016-12-06 ENCOUNTER — Inpatient Hospital Stay (HOSPITAL_COMMUNITY)
Admission: EM | Admit: 2016-12-06 | Discharge: 2016-12-09 | DRG: 310 | Disposition: A | Discharge status: Home / Self Care | Payer: Medicare Other | Attending: Internal Medicine | Admitting: Internal Medicine

## 2016-12-06 DIAGNOSIS — R55 Syncope and collapse: Secondary | ICD-10-CM | POA: Diagnosis present

## 2016-12-06 DIAGNOSIS — I480 Paroxysmal atrial fibrillation: Secondary | ICD-10-CM | POA: Diagnosis not present

## 2016-12-06 DIAGNOSIS — F329 Major depressive disorder, single episode, unspecified: Secondary | ICD-10-CM | POA: Diagnosis present

## 2016-12-06 DIAGNOSIS — I10 Essential (primary) hypertension: Secondary | ICD-10-CM | POA: Diagnosis present

## 2016-12-06 DIAGNOSIS — Z91013 Allergy to seafood: Secondary | ICD-10-CM

## 2016-12-06 DIAGNOSIS — E785 Hyperlipidemia, unspecified: Secondary | ICD-10-CM | POA: Diagnosis not present

## 2016-12-06 DIAGNOSIS — I495 Sick sinus syndrome: Secondary | ICD-10-CM | POA: Diagnosis present

## 2016-12-06 DIAGNOSIS — Z79899 Other long term (current) drug therapy: Secondary | ICD-10-CM

## 2016-12-06 DIAGNOSIS — G8929 Other chronic pain: Secondary | ICD-10-CM | POA: Diagnosis present

## 2016-12-06 DIAGNOSIS — Z888 Allergy status to other drugs, medicaments and biological substances status: Secondary | ICD-10-CM

## 2016-12-06 DIAGNOSIS — Z880 Allergy status to penicillin: Secondary | ICD-10-CM

## 2016-12-06 DIAGNOSIS — R0602 Shortness of breath: Secondary | ICD-10-CM | POA: Diagnosis not present

## 2016-12-06 DIAGNOSIS — I2510 Atherosclerotic heart disease of native coronary artery without angina pectoris: Secondary | ICD-10-CM | POA: Diagnosis not present

## 2016-12-06 DIAGNOSIS — R404 Transient alteration of awareness: Secondary | ICD-10-CM | POA: Diagnosis not present

## 2016-12-06 DIAGNOSIS — R531 Weakness: Secondary | ICD-10-CM | POA: Diagnosis not present

## 2016-12-06 DIAGNOSIS — I517 Cardiomegaly: Secondary | ICD-10-CM | POA: Diagnosis not present

## 2016-12-06 DIAGNOSIS — M546 Pain in thoracic spine: Secondary | ICD-10-CM | POA: Diagnosis not present

## 2016-12-06 DIAGNOSIS — F419 Anxiety disorder, unspecified: Secondary | ICD-10-CM | POA: Diagnosis present

## 2016-12-06 DIAGNOSIS — J9811 Atelectasis: Secondary | ICD-10-CM | POA: Diagnosis not present

## 2016-12-06 DIAGNOSIS — Z7982 Long term (current) use of aspirin: Secondary | ICD-10-CM

## 2016-12-06 LAB — COMPREHENSIVE METABOLIC PANEL
ALK PHOS: 47 U/L (ref 38–126)
ALT: 16 U/L (ref 14–54)
AST: 27 U/L (ref 15–41)
Albumin: 3.7 g/dL (ref 3.5–5.0)
Anion gap: 9 (ref 5–15)
BILIRUBIN TOTAL: 0.6 mg/dL (ref 0.3–1.2)
BUN: 13 mg/dL (ref 6–20)
CALCIUM: 9.1 mg/dL (ref 8.9–10.3)
CO2: 25 mmol/L (ref 22–32)
CREATININE: 0.99 mg/dL (ref 0.44–1.00)
Chloride: 103 mmol/L (ref 101–111)
GFR calc Af Amer: >60 mL/min (ref >60)
GFR, EST NON AFRICAN AMERICAN: 53 mL/min — AB (ref >60)
Glucose, Bld: 143 mg/dL — ABNORMAL HIGH (ref 65–99)
Potassium: 4.1 mmol/L (ref 3.5–5.1)
Sodium: 137 mmol/L (ref 135–145)
TOTAL PROTEIN: 6 g/dL — AB (ref 6.5–8.1)

## 2016-12-06 LAB — LIPASE, BLOOD: Lipase: 24 U/L (ref 11–51)

## 2016-12-06 LAB — I-STAT TROPONIN, ED: TROPONIN I, POC: 0 ng/mL (ref 0.00–0.08)

## 2016-12-06 LAB — CBC WITH DIFFERENTIAL/PLATELET
BASOS ABS: 0 10*3/uL (ref 0.0–0.1)
Basophils Relative: 0 %
Eosinophils Absolute: 0.1 10*3/uL (ref 0.0–0.7)
Eosinophils Relative: 1 %
HEMATOCRIT: 41.9 % (ref 36.0–46.0)
Hemoglobin: 13.7 g/dL (ref 12.0–15.0)
LYMPHS ABS: 2.5 10*3/uL (ref 0.7–4.0)
LYMPHS PCT: 28 %
MCH: 30.5 pg (ref 26.0–34.0)
MCHC: 32.7 g/dL (ref 30.0–36.0)
MCV: 93.3 fL (ref 78.0–100.0)
Monocytes Absolute: 0.7 10*3/uL (ref 0.1–1.0)
Monocytes Relative: 8 %
NEUTROS ABS: 5.8 10*3/uL (ref 1.7–7.7)
Neutrophils Relative %: 63 %
Platelets: 204 10*3/uL (ref 150–400)
RBC: 4.49 MIL/uL (ref 3.87–5.11)
RDW: 13.7 % (ref 11.5–15.5)
WBC: 9.1 10*3/uL (ref 4.0–10.5)

## 2016-12-06 MED ORDER — SODIUM CHLORIDE 0.9 % IV BOLUS (SEPSIS)
1000.0000 mL | Freq: Once | INTRAVENOUS | Status: AC
  Administered 2016-12-07: 1000 mL via INTRAVENOUS

## 2016-12-06 NOTE — ED Triage Notes (Signed)
Pt BIB [facility] from home where pt husband called r/t syncopal episode. Pt was sitting in her chair and husband reports LOC. Pt reported to EMS that she has not felt right all day and not been able to pinpoint exactly what was wrong. She states she went to the farmers market, took her afternoon nap, and gardened some. Also reports some exertional SOB and tight back pain when ambulating to neighbors mailbox. Pt states she took a Nitro for her back pain. Pt having nausea and vomited once upon arrival.

## 2016-12-06 NOTE — ED Notes (Signed)
Pt c/o nausea with movement. Returned to room from XR.

## 2016-12-06 NOTE — ED Provider Notes (Signed)
[name]   CSN: [id number] Arrival date & time: 12/06/16  2155  By signing my name below, I, Katherine Cardenas and Katherine Cardenas, attest that this documentation has been prepared under the direction and in the presence of Katherine Balls, MD. Electronically Signed: Marcello Cardenas and Katherine Cardenas, ED Scribe. 12/06/16. 11:53 PM.   History   Chief Complaint Chief Complaint  Patient presents with  . Loss of Consciousness  . Fatigue     The history is provided by the patient and a relative. No language interpreter was used.   HPI Comments: Katherine Cardenas is a 76 y.o. female who presents to the Emergency Department complaining of a sudden onset of a syncopal episode PTA. Pt reports onset of gradually worsening generalized fatigue and weakness after lunch and continued to perform her normal daily activities. She states she then suddenly developed mid back pain in the evening that was similar to prior pain 2 yrs prior resulting in cardiac stent, but more severe. Per husband, the pt then slumped over in her chair, losing consciousness and becoming unresponsive for 3-5 minutes. Husband denies any unilateral weakness or facial asymmetry during or after the syncopal episode. She reports that she took NTG with some relief of back pain. Pt also has associated vomiting x2, nausea and LUQ abdominal pain. Per pt, she has h/o similar abdominal pain that is not accompanied by nausea/vomiting, and reports her current pain is consistent with prior. Of note, pt has previous h/o frequent UTI, for which she takes cranberry supplements daily, and reports she forgot her daily dose today. She has a cardiologist appointment on Tuesday, 12/09/16. Pt denies h/o of stroke. She also denies diarrhea, fever, cough, rhinorrhea, dysuria, frequency, urgency, decreased urine volume.   Past Medical History:  Diagnosis Date  . Anxiety   . Atrial fibrillation ([place])   . Atrial tachycardia [facility])    --sees  cardiologist--Dr. Adrian Prows  . Depression   . Elevated troponin I level   . Facial spasm   . Heart murmur    functional-normal echocardiogram  . Hypertension     Patient Active Problem List   Diagnosis Date Noted  . Syncope 12/07/2016  . Sinus bradycardia on ECG 12/07/2016  . AF (paroxysmal atrial fibrillation) ([place]) 12/07/2016  . Hyperlipidemia 01/18/2016  . CAD (coronary artery disease) 12/15/2014    Past Surgical History:  Procedure Laterality Date  . APPENDECTOMY     age 71  . CARDIAC CATHETERIZATION N/A 12/15/2014   Procedure: Left Heart Cath and Coronary Angiography;  Surgeon: Burnell Blanks, MD;  Location: [place] [facility];  Service: Cardiovascular;  Laterality: N/A;  . CATARACT EXTRACTION     Right Eye  . DILATION AND CURETTAGE OF UTERUS  1990's   benign polyp  . TONSILLECTOMY AND ADENOIDECTOMY     --age 40    OB History    Gravida Para Term Preterm AB Living   3 3 3     3    SAB TAB Ectopic Multiple Live Births                   Home Medications    Prior to Admission medications   Medication Sig Start Date End Date Taking? Authorizing Provider  amLODipine (NORVASC) 2.5 MG tablet Take 2.5 mg by mouth at bedtime. 12/20/15  Yes Historical Provider, MD  Ascorbic Acid (VITAMIN C) 1000 MG tablet Take 1,000 mg by mouth daily before supper.    Yes Historical Provider, MD  aspirin 325 MG tablet Take 325 mg by mouth daily.   Yes Historical Provider, MD  cholecalciferol (VITAMIN D) 1000 UNITS tablet Take 1,000 Units by mouth at bedtime.    Yes Historical Provider, MD  diltiazem (CARDIZEM) 30 MG tablet Take 30 mg by mouth daily as needed (for heart rate >100).    Yes Historical Provider, MD  LORazepam (ATIVAN) 1 MG tablet Take 1 mg by mouth daily.    Yes Historical Provider, MD  Magnesium 400 MG CAPS Take 400 mg by mouth daily.   Yes Historical Provider, MD  nitroGLYCERIN (NITROSTAT) 0.4 MG SL tablet Place 0.4 mg under the tongue every 5 (five) minutes x 3  doses as needed for chest pain.   Yes Historical Provider, MD  Omega-3 1400 MG CAPS Take 1,400 mg by mouth daily.   Yes Historical Provider, MD  Polyethyl Glycol-Propyl Glycol (SYSTANE OP) Place 1 drop into both eyes 2 (two) times daily.   Yes Historical Provider, MD  propafenone (RYTHMOL) 150 MG tablet Take 150 mg by mouth every 8 (eight) hours.    Yes Historical Provider, MD  RESVERATROL-GRAPE PO Take 1 tablet by mouth at bedtime.   Yes Historical Provider, MD  Turmeric 500 MG CAPS Take 500 mg by mouth at bedtime.   Yes Historical Provider, MD  methocarbamol (ROBAXIN) 500 MG tablet Take 1 tablet (500 mg total) by mouth every 8 (eight) hours as needed. Patient not taking: Reported on 12/06/2016 09/25/16   Julianne Rice, MD  rosuvastatin (CRESTOR) 5 MG tablet Take 1 tablet (5 mg total) by mouth 2 (two) times a week. Patient not taking: Reported on 09/25/2016 01/21/16   Thayer Headings, MD    Family History Family History  Problem Relation Age of Onset  . Cancer Father 84    dec-stomach ca  . Hypertension Mother   . Renal Disease Mother 30    dec-renal failure  . Hypertension Maternal Grandmother   . Heart attack Maternal Grandmother     Social History Social History  Substance Use Topics  . Smoking status: Never Smoker  . Smokeless tobacco: Never Used  . Alcohol use 8.4 oz/week    14 Glasses of wine per week     Allergies   Augmentin [amoxicillin-pot clavulanate]; Ciprofloxacin; Diltiazem; Epinephrine; Novocain [procaine]; Avapro [irbesartan]; Shrimp [shellfish allergy]; Iohexol; Penicillins; and Zithromax [azithromycin]   Review of Systems Review of Systems  10 Systems reviewed and are negative for acute change except as noted in the HPI.  Physical Exam Updated Vital Signs BP 134/78   Pulse 66   Resp 18   Ht 5\' 6"  (1.676 m)   Wt 155 lb (70.3 kg)   LMP 08/11/1988 (Approximate)   SpO2 99%   BMI 25.02 kg/m   Physical Exam  Constitutional: She is oriented to person,  place, and time. She appears well-developed and well-nourished. No distress.  HENT:  Head: Normocephalic and atraumatic.  Nose: Nose normal.  Mouth/Throat: Oropharynx is clear and moist. No oropharyngeal exudate.  Eyes: Conjunctivae and EOM are normal. Pupils are equal, round, and reactive to light. No scleral icterus.  Neck: Normal range of motion. Neck supple. No JVD present. No tracheal deviation present. No thyromegaly present.  Cardiovascular: Normal rate, regular rhythm and normal heart sounds.  Exam reveals no gallop and no friction rub.   No murmur heard. Pulmonary/Chest: Effort normal and breath sounds normal. No respiratory distress. She has no wheezes. She exhibits no tenderness.  Abdominal: Soft. Bowel sounds are normal.  She exhibits no distension and no mass. There is no tenderness. There is no rebound and no guarding.  Musculoskeletal: Normal range of motion. She exhibits no edema or tenderness.  Lymphadenopathy:    She has no cervical adenopathy.  Neurological: She is alert and oriented to person, place, and time. No cranial nerve deficit. She exhibits normal muscle tone.  Skin: Skin is warm and dry. No rash noted. No erythema. No pallor.  Nursing note and vitals reviewed.    ED Treatments / Results  DIAGNOSTIC STUDIES: Oxygen Saturation is 98% on RA, normal by my interpretation.   COORDINATION OF CARE: 11:10 PM-Discussed next steps with pt. Pt verbalized understanding and is agreeable with the plan.   Labs (all labs ordered are listed, but only abnormal results are displayed) Labs Reviewed  COMPREHENSIVE METABOLIC PANEL - Abnormal; Notable for the following:       Result Value   Glucose, Bld 143 (*)    Total Protein 6.0 (*)    GFR calc non Af Amer 53 (*)    All other components within normal limits  CBC WITH DIFFERENTIAL/PLATELET  URINALYSIS, ROUTINE W REFLEX MICROSCOPIC  LIPASE, BLOOD  I-STAT TROPOININ, ED    EKG  EKG  Interpretation  Date/Time:  Saturday December 06 2016 22:11:42 EDT Ventricular Rate:  56 PR Interval:    QRS Duration: 112 QT Interval:  431 QTC Calculation: 416 R Axis:   19 Text Interpretation:  Sinus rhythm Probable left atrial enlargement Incomplete right bundle branch block No significant change since last tracing Confirmed by Maryan Rued  MD, Loree Fee ([id number]) on 12/06/2016 10:30:03 PM       Radiology Dg Chest 2 View  Result Date: 12/06/2016 CLINICAL DATA:  Syncope. History of hypertension and atrial fibrillation. EXAM: CHEST  2 VIEW COMPARISON:  Chest radiograph September 25, 2016 FINDINGS: Cardiac silhouette is mildly enlarged and unchanged. Mediastinal silhouette is nonsuspicious. No pleural effusion or focal consolidation. Strandy densities LEFT lung base. No pneumothorax. Soft tissue planes and included osseous structures are nonsuspicious. Osteopenia. IMPRESSION: Stable cardiomegaly and LEFT lung base atelectasis. Electronically Signed   By: Elon Alas M.D.   On: 12/06/2016 23:29    Procedures Procedures (including critical care time)  Medications Ordered in ED Medications  sodium chloride 0.9 % bolus 1,000 mL (1,000 mLs Intravenous New Bag/Given 12/07/16 0012)     Initial Impression / Assessment and Plan / ED Course  I have reviewed the triage vital signs and the nursing notes.  Pertinent labs & imaging results that were available during my care of the patient were reviewed by me and considered in my medical decision making (see chart for details).  Clinical Course as of Dec 07 53  Sun Dec 07, 2016  0053 Consulted with Dr. Myna Hidalgo, who will admit the pt.   [EM]    Clinical Course User Index [EM] Katherine Cardenas    Patient presents to the ED for generalized weakness, exertional SOB and severe back pain.  Given her age and sex, this could certainly be cardiac in nature.  She continues to feel very weak and not her self in the ED.  Labs, CXR and EKG are pending.  Will  continue to close monitor.  Her back pain was relieved with her nitro at home.  She will likely require admission for cardiac work up.   12:54 AM I spoke with Dr. Myna Hidalgo who will admit for further care. Final Clinical Impressions(s) / ED Diagnoses   Final diagnoses:  Weakness  SOB (  shortness of breath)    New Prescriptions New Prescriptions   No medications on file  I personally performed the services described in this documentation, which was scribed in my presence. The recorded information has been reviewed and is accurate.       Katherine Balls, MD 12/07/16 [phone]

## 2016-12-06 NOTE — ED Notes (Signed)
Patient transported to X-ray on monitor.

## 2016-12-06 NOTE — ED Notes (Signed)
MD at bedside. 

## 2016-12-06 NOTE — ED Notes (Signed)
Delay in lab draw,   Pt enroute to xray. 

## 2016-12-07 ENCOUNTER — Emergency Department (HOSPITAL_COMMUNITY): Payer: Medicare Other

## 2016-12-07 ENCOUNTER — Inpatient Hospital Stay (HOSPITAL_COMMUNITY): Payer: Medicare Other

## 2016-12-07 ENCOUNTER — Encounter (HOSPITAL_COMMUNITY): Payer: Self-pay | Admitting: Family Medicine

## 2016-12-07 DIAGNOSIS — I495 Sick sinus syndrome: Secondary | ICD-10-CM | POA: Diagnosis present

## 2016-12-07 DIAGNOSIS — G8929 Other chronic pain: Secondary | ICD-10-CM | POA: Diagnosis present

## 2016-12-07 DIAGNOSIS — I10 Essential (primary) hypertension: Secondary | ICD-10-CM | POA: Diagnosis present

## 2016-12-07 DIAGNOSIS — Z880 Allergy status to penicillin: Secondary | ICD-10-CM | POA: Diagnosis not present

## 2016-12-07 DIAGNOSIS — I480 Paroxysmal atrial fibrillation: Secondary | ICD-10-CM | POA: Diagnosis present

## 2016-12-07 DIAGNOSIS — R531 Weakness: Secondary | ICD-10-CM | POA: Diagnosis not present

## 2016-12-07 DIAGNOSIS — R001 Bradycardia, unspecified: Secondary | ICD-10-CM | POA: Diagnosis not present

## 2016-12-07 DIAGNOSIS — E785 Hyperlipidemia, unspecified: Secondary | ICD-10-CM | POA: Diagnosis present

## 2016-12-07 DIAGNOSIS — M546 Pain in thoracic spine: Secondary | ICD-10-CM | POA: Diagnosis present

## 2016-12-07 DIAGNOSIS — R55 Syncope and collapse: Secondary | ICD-10-CM

## 2016-12-07 DIAGNOSIS — F419 Anxiety disorder, unspecified: Secondary | ICD-10-CM | POA: Diagnosis present

## 2016-12-07 DIAGNOSIS — I2510 Atherosclerotic heart disease of native coronary artery without angina pectoris: Secondary | ICD-10-CM | POA: Diagnosis not present

## 2016-12-07 DIAGNOSIS — Z888 Allergy status to other drugs, medicaments and biological substances status: Secondary | ICD-10-CM | POA: Diagnosis not present

## 2016-12-07 DIAGNOSIS — F329 Major depressive disorder, single episode, unspecified: Secondary | ICD-10-CM | POA: Diagnosis present

## 2016-12-07 DIAGNOSIS — I471 Supraventricular tachycardia: Secondary | ICD-10-CM | POA: Diagnosis not present

## 2016-12-07 DIAGNOSIS — Z79899 Other long term (current) drug therapy: Secondary | ICD-10-CM | POA: Diagnosis not present

## 2016-12-07 DIAGNOSIS — Z91013 Allergy to seafood: Secondary | ICD-10-CM | POA: Diagnosis not present

## 2016-12-07 DIAGNOSIS — Z7982 Long term (current) use of aspirin: Secondary | ICD-10-CM | POA: Diagnosis not present

## 2016-12-07 LAB — URINALYSIS, ROUTINE W REFLEX MICROSCOPIC
Bilirubin Urine: NEGATIVE
GLUCOSE, UA: NEGATIVE mg/dL
Hgb urine dipstick: NEGATIVE
KETONES UR: NEGATIVE mg/dL
LEUKOCYTES UA: NEGATIVE
NITRITE: NEGATIVE
PROTEIN: NEGATIVE mg/dL
Specific Gravity, Urine: 1.014 (ref 1.005–1.030)
pH: 6 (ref 5.0–8.0)

## 2016-12-07 LAB — ECHOCARDIOGRAM COMPLETE
HEIGHTINCHES: 66 in
WEIGHTICAEL: 2585.6 [oz_av]

## 2016-12-07 LAB — TROPONIN I

## 2016-12-07 LAB — GLUCOSE, CAPILLARY: GLUCOSE-CAPILLARY: 107 mg/dL — AB (ref 65–99)

## 2016-12-07 MED ORDER — ACETAMINOPHEN 650 MG RE SUPP: 650.0000 mg | Freq: Four times a day (QID) | RECTAL | Status: DC | PRN

## 2016-12-07 MED ORDER — SODIUM CHLORIDE 0.9 % IV SOLN
INTRAVENOUS | Status: AC
  Administered 2016-12-07: 17:00:00 via INTRAVENOUS

## 2016-12-07 MED ORDER — POLYETHYLENE GLYCOL 3350 17 G PO PACK
17.0000 g | PACK | Freq: Every day | ORAL | Status: DC | PRN
Start: 2016-12-07 — End: 2016-12-09

## 2016-12-07 MED ORDER — OMEGA-3-ACID ETHYL ESTERS 1 G PO CAPS
1000.0000 mg | ORAL_CAPSULE | Freq: Every day | ORAL | Status: DC
  Administered 2016-12-07 – 2016-12-09 (×3): 1000 mg via ORAL
  Filled 2016-12-07 (×3): qty 1

## 2016-12-07 MED ORDER — AMLODIPINE BESYLATE 5 MG PO TABS
2.5000 mg | ORAL_TABLET | Freq: Every day | ORAL | Status: DC
  Administered 2016-12-07 – 2016-12-08 (×3): 2.5 mg via ORAL
  Filled 2016-12-07 (×2): qty 1

## 2016-12-07 MED ORDER — ONDANSETRON HCL 4 MG/2ML IJ SOLN: 4.0000 mg | Freq: Four times a day (QID) | INTRAMUSCULAR | Status: DC | PRN

## 2016-12-07 MED ORDER — ALUM & MAG HYDROXIDE-SIMETH 200-200-20 MG/5ML PO SUSP: 30.0000 mL | Freq: Four times a day (QID) | ORAL | Status: DC | PRN

## 2016-12-07 MED ORDER — LORAZEPAM 1 MG PO TABS
1.0000 mg | ORAL_TABLET | Freq: Every day | ORAL | Status: DC
  Administered 2016-12-07 – 2016-12-09 (×3): 1 mg via ORAL
  Filled 2016-12-07 (×3): qty 1

## 2016-12-07 MED ORDER — MAGNESIUM OXIDE 400 (241.3 MG) MG PO TABS
400.0000 mg | ORAL_TABLET | Freq: Every day | ORAL | Status: DC
  Administered 2016-12-07 – 2016-12-09 (×3): 400 mg via ORAL
  Filled 2016-12-07 (×3): qty 1

## 2016-12-07 MED ORDER — ENOXAPARIN SODIUM 40 MG/0.4ML ~~LOC~~ SOLN
40.0000 mg | Freq: Every day | SUBCUTANEOUS | Status: DC
  Administered 2016-12-07 – 2016-12-09 (×3): 40 mg via SUBCUTANEOUS
  Filled 2016-12-07 (×3): qty 0.4

## 2016-12-07 MED ORDER — HYDROCODONE-ACETAMINOPHEN 5-325 MG PO TABS: 1.0000 | ORAL_TABLET | ORAL | Status: DC | PRN

## 2016-12-07 MED ORDER — PROPAFENONE HCL 150 MG PO TABS
150.0000 mg | ORAL_TABLET | Freq: Three times a day (TID) | ORAL | Status: DC
  Administered 2016-12-07: 150 mg via ORAL
  Filled 2016-12-07 (×2): qty 1

## 2016-12-07 MED ORDER — ASPIRIN 325 MG PO TABS
325.0000 mg | ORAL_TABLET | Freq: Every day | ORAL | Status: DC
  Administered 2016-12-08 – 2016-12-09 (×2): 325 mg via ORAL
  Filled 2016-12-07 (×2): qty 1

## 2016-12-07 MED ORDER — ROSUVASTATIN CALCIUM 10 MG PO TABS: 5.0000 mg | ORAL_TABLET | ORAL | Status: DC

## 2016-12-07 MED ORDER — ONDANSETRON HCL 4 MG PO TABS: 4.0000 mg | ORAL_TABLET | Freq: Four times a day (QID) | ORAL | Status: DC | PRN

## 2016-12-07 MED ORDER — SODIUM CHLORIDE 0.9% FLUSH
3.0000 mL | Freq: Two times a day (BID) | INTRAVENOUS | Status: DC
  Administered 2016-12-07 – 2016-12-09 (×5): 3 mL via INTRAVENOUS

## 2016-12-07 MED ORDER — POLYVINYL ALCOHOL 1.4 % OP SOLN
Freq: Two times a day (BID) | OPHTHALMIC | Status: DC
  Administered 2016-12-07: 09:00:00 via OPHTHALMIC
  Administered 2016-12-07: 1 [drp] via OPHTHALMIC
  Administered 2016-12-07 – 2016-12-08 (×2): via OPHTHALMIC
  Administered 2016-12-08: 1 [drp] via OPHTHALMIC
  Administered 2016-12-09: 10:00:00 via OPHTHALMIC
  Filled 2016-12-07: qty 15

## 2016-12-07 MED ORDER — ACETAMINOPHEN 325 MG PO TABS: 650.0000 mg | ORAL_TABLET | Freq: Four times a day (QID) | ORAL | Status: DC | PRN

## 2016-12-07 MED ORDER — VITAMIN D 1000 UNITS PO TABS
1000.0000 [IU] | ORAL_TABLET | Freq: Every day | ORAL | Status: DC
  Administered 2016-12-07 – 2016-12-08 (×3): 1000 [IU] via ORAL
  Filled 2016-12-07 (×2): qty 1

## 2016-12-07 MED ORDER — SODIUM CHLORIDE 0.9 % IV SOLN
INTRAVENOUS | Status: AC
  Administered 2016-12-07: 03:00:00 via INTRAVENOUS

## 2016-12-07 NOTE — ED Notes (Signed)
Pt back from CT

## 2016-12-07 NOTE — H&P (Signed)
History and Physical    Katherine Cardenas CBJ:[id number] DOB: [birth year] DOA: 12/06/2016  PCP: Mathews Argyle, MD   Patient coming from: Home  Chief Complaint: Syncope, malaise   HPI: Katherine Cardenas is a 76 y.o. female with medical history significant for anxiety, paroxysmal atrial fibrillation, hyperlipidemia, and mild coronary artery disease by cath in 2016 presents to the emergency department for evaluation of a syncopal episode. She reports that she had been in her usual state of health until early this afternoon when she noted a nonspecific malaise and fatigue. She took a nap, but upon wakening, continued to feel poorly, but with difficulty specifying further. He had gone to the [employer] earlier in the day and had done some gardening around her house despite her nonspecific malaise. After her nap, the patient experienced an episode of severe upper back pain. She has experienced this intermittently for many years and her cardiologist had raised the possibility that it could possibly be a cardiac pain. With this in mind, the patient took a dose of sublingual nitroglycerin without immediate relief, though the pain eventually resolved. Shortly after taking the nitroglycerin, while resting in a chair, patient was noted by her husband to become poorly responsive, mumbling incoherently when he attempted to arouse her. He called 911 and she then regained her awareness within a minute or so. Patient's husband is a neurologist and denies witnessing any seizure-like activity. There was no incontinence. Patient denies any chest pain or palpitations and denies any headache, change in vision or hearing, or focal numbness or weakness.   ED Course: Upon arrival to the ED, patient is found to be saturating well on room air, bradycardic in the mid 50s, and with vitals otherwise stable. EKG features a sinus bradycardia with rate 56 and incomplete right bundle branch block which appears unchanged from  priors. Chest x-ray is notable for stable cardiomegaly and left basilar atelectasis. Chemistry panel is unremarkable. CBC is entirely within the normal limits, troponin is undetectable, and urinalysis is unremarkable. Noncontrast head CT was performed and is a normal study for age. Patient was given 1 L of normal saline in the ED, remained hemodynamically stable, and in no apparent respiratory distress. She will be observed on the telemetry unit for ongoing evaluation and management of a syncopal episode with etiology not yet clear, possibly transient hypotension after taking nitroglycerin.  Review of Systems:  All other systems reviewed and apart from HPI, are negative.  Past Medical History:  Diagnosis Date  . Anxiety   . Atrial fibrillation ([place])   . Atrial tachycardia [facility])    --sees cardiologist--Dr. Adrian Prows  . Depression   . Elevated troponin I level   . Facial spasm   . Heart murmur    functional-normal echocardiogram  . Hypertension     Past Surgical History:  Procedure Laterality Date  . APPENDECTOMY     age 48  . CARDIAC CATHETERIZATION N/A 12/15/2014   Procedure: Left Heart Cath and Coronary Angiography;  Surgeon: Burnell Blanks, MD;  Location: [facility];  Service: Cardiovascular;  Laterality: N/A;  . CATARACT EXTRACTION     Right Eye  . DILATION AND CURETTAGE OF UTERUS  1990's   benign polyp  . TONSILLECTOMY AND ADENOIDECTOMY     --age 37     reports that she has never smoked. She has never used smokeless tobacco. She reports that she drinks about 8.4 oz of alcohol per week . She reports that she does not  use drugs.  Allergies  Allergen Reactions  . Augmentin [Amoxicillin-Pot Clavulanate] Hives  . Ciprofloxacin Palpitations and Other (See Comments)    "irregular heart beat"  . Diltiazem Other (See Comments)    Pulse dropped too low  . Epinephrine Other (See Comments)    "NOVOCAIN"--could be the epinephrine----patient didn't tolerate-SYNCOPE     . Novocain [Procaine] Other (See Comments)    could be the epinephrine----patient didn't tolerate-SYNCOPE  . Avapro [Irbesartan] Swelling  . Shrimp [Shellfish Allergy] Hives  . Iohexol Itching and Rash     Code: RASH, Desc: patient states hx of possible reaction to ivp contrast years ago   . Penicillins Hives    Has patient had a PCN reaction causing immediate rash, facial/tongue/throat swelling, SOB or lightheadedness with hypotension: Yes Has patient had a PCN reaction causing severe rash involving mucus membranes or skin necrosis: No Has patient had a PCN reaction that required hospitalization No Has patient had a PCN reaction occurring within the last 10 years: No If all of the above answers are "NO", then may proceed with Cephalosporin use.  [name] Zithromax [Azithromycin] Other (See Comments)    Cannot take with propafenone    Family History  Problem Relation Age of Onset  . Cancer Father 60    dec-stomach ca  . Hypertension Mother   . Renal Disease Mother 4    dec-renal failure  . Hypertension Maternal Grandmother   . Heart attack Maternal Grandmother      Prior to Admission medications   Medication Sig Start Date End Date Taking? Authorizing Provider  amLODipine (NORVASC) 2.5 MG tablet Take 2.5 mg by mouth at bedtime. 12/20/15  Yes Historical Provider, MD  Ascorbic Acid (VITAMIN C) 1000 MG tablet Take 1,000 mg by mouth daily before supper.    Yes Historical Provider, MD  aspirin 325 MG tablet Take 325 mg by mouth daily.   Yes Historical Provider, MD  cholecalciferol (VITAMIN D) 1000 UNITS tablet Take 1,000 Units by mouth at bedtime.    Yes Historical Provider, MD  diltiazem (CARDIZEM) 30 MG tablet Take 30 mg by mouth daily as needed (for heart rate >100).    Yes Historical Provider, MD  LORazepam (ATIVAN) 1 MG tablet Take 1 mg by mouth daily.    Yes Historical Provider, MD  Magnesium 400 MG CAPS Take 400 mg by mouth daily.   Yes Historical Provider, MD  nitroGLYCERIN  (NITROSTAT) 0.4 MG SL tablet Place 0.4 mg under the tongue every 5 (five) minutes x 3 doses as needed for chest pain.   Yes Historical Provider, MD  Omega-3 1400 MG CAPS Take 1,400 mg by mouth daily.   Yes Historical Provider, MD  Polyethyl Glycol-Propyl Glycol (SYSTANE OP) Place 1 drop into both eyes 2 (two) times daily.   Yes Historical Provider, MD  propafenone (RYTHMOL) 150 MG tablet Take 150 mg by mouth every 8 (eight) hours.    Yes Historical Provider, MD  RESVERATROL-GRAPE PO Take 1 tablet by mouth at bedtime.   Yes Historical Provider, MD  Turmeric 500 MG CAPS Take 500 mg by mouth at bedtime.   Yes Historical Provider, MD  methocarbamol (ROBAXIN) 500 MG tablet Take 1 tablet (500 mg total) by mouth every 8 (eight) hours as needed. Patient not taking: Reported on 12/06/2016 09/25/16   Julianne Rice, MD  rosuvastatin (CRESTOR) 5 MG tablet Take 1 tablet (5 mg total) by mouth 2 (two) times a week. Patient not taking: Reported on 09/25/2016 01/21/16   Wonda Cheng  Nahser, MD    Physical Exam: Vitals:   12/06/16 2315 12/06/16 2330 12/06/16 2345 12/07/16 0015  BP: (!) 143/83 123/66 131/64 134/78  Pulse: 62 (!) 56 65 66  Resp: (!) 29 16 (!) 21 18  SpO2: 98% 98% 99% 99%  Weight:      Height:          Constitutional: NAD, calm, appears fatigued Eyes: PERTLA, lids and conjunctivae normal ENMT: Mucous membranes are moist. Posterior pharynx clear of any exudate or lesions.   Neck: normal, supple, no masses, no thyromegaly Respiratory: clear to auscultation bilaterally, no wheezing, no crackles. Normal respiratory effort.  Cardiovascular: S1 & S2 heard, regular rate and rhythm. No extremity edema. No significant JVD. Abdomen: No distension, soft, no tenderness, no masses palpated. Bowel sounds active.  Musculoskeletal: no clubbing / cyanosis. No joint deformity upper and lower extremities.  Skin: no significant rashes, lesions, ulcers. Warm, dry, well-perfused. Neurologic: CN 2-12 grossly  intact. Sensation intact, DTR normal. Strength 5/5 in all 4 limbs.  Psychiatric: Alert and oriented x 3. Normal mood and affect.     Labs on Admission: I have personally reviewed following labs and imaging studies  CBC:  Recent Labs Lab 12/06/16 2316  WBC 9.1  NEUTROABS 5.8  HGB 13.7  HCT 41.9  MCV 93.3  PLT 518   Basic Metabolic Panel:  Recent Labs Lab 12/06/16 2316  NA 137  K 4.1  CL 103  CO2 25  GLUCOSE 143*  BUN 13  CREATININE 0.99  CALCIUM 9.1   GFR: Estimated Creatinine Clearance: 43.8 mL/min (by C-G formula based on SCr of 0.99 mg/dL). Liver Function Tests:  Recent Labs Lab 12/06/16 2316  AST 27  ALT 16  ALKPHOS 47  BILITOT 0.6  PROT 6.0*  ALBUMIN 3.7    Recent Labs Lab 12/06/16 2311  LIPASE 24   No results for input(s): AMMONIA in the last 168 hours. Coagulation Profile: No results for input(s): INR, PROTIME in the last 168 hours. Cardiac Enzymes: No results for input(s): CKTOTAL, CKMB, CKMBINDEX, TROPONINI in the last 168 hours. BNP (last 3 results) No results for input(s): PROBNP in the last 8760 hours. HbA1C: No results for input(s): HGBA1C in the last 72 hours. CBG: No results for input(s): GLUCAP in the last 168 hours. Lipid Profile: No results for input(s): CHOL, HDL, LDLCALC, TRIG, CHOLHDL, LDLDIRECT in the last 72 hours. Thyroid Function Tests: No results for input(s): TSH, T4TOTAL, FREET4, T3FREE, THYROIDAB in the last 72 hours. Anemia Panel: No results for input(s): VITAMINB12, FOLATE, FERRITIN, TIBC, IRON, RETICCTPCT in the last 72 hours. Urine analysis:    Component Value Date/Time   COLORURINE YELLOW 12/06/2016 0006   APPEARANCEUR CLEAR 12/06/2016 0006   LABSPEC 1.014 12/06/2016 0006   PHURINE 6.0 12/06/2016 0006   GLUCOSEU NEGATIVE 12/06/2016 0006   HGBUR NEGATIVE 12/06/2016 0006   BILIRUBINUR NEGATIVE 12/06/2016 0006   BILIRUBINUR n 08/25/2014 0951   KETONESUR NEGATIVE 12/06/2016 0006   PROTEINUR NEGATIVE  12/06/2016 0006   UROBILINOGEN negative 08/25/2014 0951   NITRITE NEGATIVE 12/06/2016 0006   LEUKOCYTESUR NEGATIVE 12/06/2016 0006   Sepsis Labs: @LABRCNTIP (procalcitonin:4,lacticidven:4) )No results found for this or any previous visit (from the past 240 hour(s)).   Radiological Exams on Admission: Dg Chest 2 View  Result Date: 12/06/2016 CLINICAL DATA:  Syncope. History of hypertension and atrial fibrillation. EXAM: CHEST  2 VIEW COMPARISON:  Chest radiograph September 25, 2016 FINDINGS: Cardiac silhouette is mildly enlarged and unchanged. Mediastinal silhouette is nonsuspicious. No  pleural effusion or focal consolidation. Strandy densities LEFT lung base. No pneumothorax. Soft tissue planes and included osseous structures are nonsuspicious. Osteopenia. IMPRESSION: Stable cardiomegaly and LEFT lung base atelectasis. Electronically Signed   By: Elon Alas M.D.   On: 12/06/2016 23:29   Ct Head Wo Contrast  Result Date: 12/07/2016 CLINICAL DATA:  Witnessed syncopal episode at table tonight. No head injury. Feeling unwell. History of atrial fibrillation, hypertension. EXAM: CT HEAD WITHOUT CONTRAST TECHNIQUE: Contiguous axial images were obtained from the base of the skull through the vertex without intravenous contrast. COMPARISON:  MRI of the head March 04, 2013 FINDINGS: BRAIN: No intraparenchymal hemorrhage, mass effect nor midline shift. The ventricles and sulci are normal for age. Patchy supratentorial white matter hypodensities less than expected for patient's age, though non-specific are most compatible with chronic small vessel ischemic disease. No acute large vascular territory infarcts. No abnormal extra-axial fluid collections. Basal cisterns are patent. VASCULAR: Mild calcific atherosclerosis of the carotid siphons. SKULL: No skull fracture. No significant scalp soft tissue swelling. SINUSES/ORBITS: Paranasal sinus mucosal thickening with small LEFT maxillary sinus air-fluid level.  Mastoid air cells are well aerated. The included ocular globes and orbital contents are non-suspicious. Status post RIGHT ocular lens implant. OTHER: None. IMPRESSION: Negative noncontrast CT HEAD for age. Electronically Signed   By: Elon Alas M.D.   On: 12/07/2016 00:56    EKG: Independently reviewed. Sinus bradycardia (rate 56), incomplete RBBB, not significantly changed from prior.   Assessment/Plan  1. Syncope  - Pt presents following a transient lapse in consciousness while seated, witnessed by her neurologist husband, and resolved within 1-2 minutes  - There was no associated chest pain or palpitations, and no headache or focal neurologic sxs - She had been feeling fatigued, and with non-specific malaise for a few hrs prior the event  - She took a sublingual NTG (only the second time she has ever taken) for an episode of her recurrent high back pain that a physician had previously told her could possibly represent angina; there was no immediate change in the pain which eventually subsided  - Initial workup is reassuring with normal head CT, negative troponin, EKG with sinus brady in mid-50's  - Possibly secondary to transient hypotension from the NTG; propafenone could play a role, but she had seemed to be tolerating the medication well  - Plan to observe on telemetry and obtain echocardiogram    2. Sinus bradycardia  - HR is mid-50's; no anginal complaints  - Monitor on telemetry, consider holding propafenone if rate slows further  - No anginal complaints to suggest ischemic etiology    3. Paroxysmal atrial fibrillation  - Pt is followed by cardiology for this, not on anticoagulation and reported to have only very rare episodes of a fib; she denies any palpitations in several years  - CHADS-VASc is 40 (age x2, gender, CAD) - Continue propafenone as tolerated, continue daily ASA 325     4. CAD - No anginal complaints; troponin undetectable   - Cath in June 2016 with only mild  CAD  - She follows with cardiology and is managed with Crestor and ASA, will continue    5. Anxiety  - Appears stable - Continue prn Ativan   DVT prophylaxis: sq Lovenox  Code Status: Full  Family Communication: Husband updated at bedside Disposition Plan: Observe on telemetry Consults called: None Admission status: Observation    Vianne Bulls, MD [facility] Pager [phone]  If 7PM-7AM, please contact night-coverage  [email or web address] Password [email or web address]  12/07/2016, 1:12 AM

## 2016-12-07 NOTE — Consult Note (Signed)
CARDIOLOGY CONSULT NOTE   Patient ID: Katherine Cardenas MRN: [id number] DOB/AGE: 1941/01/15 76 y.o.  Admit date: 12/06/2016  Requesting Physician: Dr. Candiss Norse  Primary Physician:   Mathews Argyle, MD Primary Cardiologist:  Dr. Acie Fredrickson Reason for Consultation:  syncope  Katherine Cardenas is a 76 y.o. female who is being seen today for the evaluation of syncope at the request of Dr. Candiss Norse.   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   She has a history of PAF/PAT on propafenone. She was on Coumadin for some time but couldn't tolerate this and has been on ASA 325mg  since. She also had some sinus bradycardia and diltiazem was discontinued. She is followed by Dr. Ola Spurr (electrophysiologist) at [facility].  She has complained of back pain/pressure over the years and has been followed by Dr. Acie Fredrickson.  She was seen in 2016 for some episodes of back discomfort.  She was found have mildly elevated troponin levels. Cardiac catheterization was essentially normal. She was found have very minimal coronary artery disease. She did have a subtle wall motion abnormalities so it was thought that she may have had some myocarditis or perhaps occluded a very small branch that was not seen at cath.  She was seen in the [facility] ED on 09/25/16 for back pain. Troponin 2 was normal. D-dimer was normal. It was felt to be MSK and Rx'd Robaxin. She was discharged home. She called into our office and imdur was prescribed but she did not want to take this until she discussed with Dr. Acie Fredrickson.   She was in her usual state of health until yesterday when she just felt totally wiped out. She didn't have energy to walk to the mailbox. She had general malaise that concerned her. She took a nap and upon awaking had her  typical upper back pain but this time it was the most severe it has ever been. She called her husband (a retired Garment/textile technologist) for help. She decided to take a SL NTG. Shortly after that she felt her entire body go numb and then had a syncopal episode and her husband called EMS. He reported that she was unconscious about 3 minutes and there was no seizure like activity or incontinence. She woke up without any confusion. Patient denies any chest pain or palpitations and denies any headache, change in vision or hearing, or focal numbness or weakness.   The patient admits to feeling very stressed recently. She struggles with anxiety and care giving for her husband who has dementia. She takes lorazepam almost every AM which helps a lot. She struggles watching him deteriorate, having good and bad days. She becomes very tearful in the room discussing this.    ED course: ECG stable with sinus brady with IRBBB, CXR shows stable cardiomegaly and left basilar atelectasis. Chemistry panel is unremarkable. CBC is entirely within the normal limits, troponin is undetectable, and urinalysis is unremarkable. Noncontrast head CT was performed and is a normal study for age   Past Medical History:  Diagnosis Date  . Anxiety   . Atrial fibrillation ([place])   . Atrial tachycardia [facility])    --sees cardiologist--Dr. Adrian Prows  . Depression   . Elevated troponin I level   . Facial spasm   . Heart murmur  functional-normal echocardiogram  . Hypertension      Past Surgical History:  Procedure Laterality Date  . APPENDECTOMY     age 3  . CARDIAC CATHETERIZATION N/A 12/15/2014   Procedure: Left Heart Cath and Coronary Angiography;  Surgeon: Burnell Blanks, MD;  Location: [facility];  Service: Cardiovascular;  Laterality: N/A;  . CATARACT EXTRACTION     Right Eye  . DILATION AND CURETTAGE OF UTERUS  1990's   benign polyp  . TONSILLECTOMY AND ADENOIDECTOMY     --age 56    Allergies  Allergen  Reactions  . Augmentin [Amoxicillin-Pot Clavulanate] Hives  . Ciprofloxacin Palpitations and Other (See Comments)    "irregular heart beat"  . Diltiazem Other (See Comments)    Pulse dropped too low  . Epinephrine Other (See Comments)    "NOVOCAIN"--could be the epinephrine----patient didn't tolerate-SYNCOPE    . Novocain [Procaine] Other (See Comments)    could be the epinephrine----patient didn't tolerate-SYNCOPE  . Avapro [Irbesartan] Swelling  . Shrimp [Shellfish Allergy] Hives  . Iohexol Itching and Rash     Code: RASH, Desc: patient states hx of possible reaction to ivp contrast years ago   . Penicillins Hives    Has patient had a PCN reaction causing immediate rash, facial/tongue/throat swelling, SOB or lightheadedness with hypotension: Yes Has patient had a PCN reaction causing severe rash involving mucus membranes or skin necrosis: No Has patient had a PCN reaction that required hospitalization No Has patient had a PCN reaction occurring within the last 10 years: No If all of the above answers are "NO", then may proceed with Cephalosporin use.  [name] Zithromax [Azithromycin] Other (See Comments)    Cannot take with propafenone    I have reviewed the patient's current medications . amLODipine  2.5 mg Oral QHS  . [START ON 12/08/2016] aspirin  325 mg Oral Daily  . cholecalciferol  1,000 Units Oral QHS  . enoxaparin (LOVENOX) injection  40 mg Subcutaneous Daily  . LORazepam  1 mg Oral Daily  . magnesium oxide  400 mg Oral Daily  . omega-3 acid ethyl esters  1,000 mg Oral Daily  . polyvinyl alcohol   Both Eyes BID  . propafenone  150 mg Oral Q8H  . sodium chloride flush  3 mL Intravenous Q12H   . sodium chloride 75 mL/hr at 12/07/16 0241   acetaminophen **OR** acetaminophen, alum & mag hydroxide-simeth, HYDROcodone-acetaminophen, ondansetron **OR** ondansetron (ZOFRAN) IV, polyethylene glycol  Prior to Admission medications   Medication Sig Start Date End Date Taking?  Authorizing Provider  amLODipine (NORVASC) 2.5 MG tablet Take 2.5 mg by mouth at bedtime. 12/20/15  Yes Historical Provider, MD  Ascorbic Acid (VITAMIN C) 1000 MG tablet Take 1,000 mg by mouth daily before supper.    Yes Historical Provider, MD  aspirin 325 MG tablet Take 325 mg by mouth daily.   Yes Historical Provider, MD  cholecalciferol (VITAMIN D) 1000 UNITS tablet Take 1,000 Units by mouth at bedtime.    Yes Historical Provider, MD  diltiazem (CARDIZEM) 30 MG tablet Take 30 mg by mouth daily as needed (for heart rate >100).    Yes Historical Provider, MD  LORazepam (ATIVAN) 1 MG tablet Take 1 mg by mouth daily.    Yes Historical Provider, MD  Magnesium 400 MG CAPS Take 400 mg by mouth daily.   Yes Historical Provider, MD  nitroGLYCERIN (NITROSTAT) 0.4 MG SL tablet Place 0.4 mg under the tongue every 5 (five) minutes x 3 doses  as needed for chest pain.   Yes Historical Provider, MD  Omega-3 1400 MG CAPS Take 1,400 mg by mouth daily.   Yes Historical Provider, MD  Polyethyl Glycol-Propyl Glycol (SYSTANE OP) Place 1 drop into both eyes 2 (two) times daily.   Yes Historical Provider, MD  propafenone (RYTHMOL) 150 MG tablet Take 150 mg by mouth every 8 (eight) hours.    Yes Historical Provider, MD  RESVERATROL-GRAPE PO Take 1 tablet by mouth at bedtime.   Yes Historical Provider, MD  Turmeric 500 MG CAPS Take 500 mg by mouth at bedtime.   Yes Historical Provider, MD  methocarbamol (ROBAXIN) 500 MG tablet Take 1 tablet (500 mg total) by mouth every 8 (eight) hours as needed. Patient not taking: Reported on 12/06/2016 09/25/16   Julianne Rice, MD  rosuvastatin (CRESTOR) 5 MG tablet Take 1 tablet (5 mg total) by mouth 2 (two) times a week. Patient not taking: Reported on 09/25/2016 01/21/16   Thayer Headings, MD     Social History   Social History  . Marital status: Married    Spouse name: N/A  . Number of children: N/A  . Years of education: N/A   Occupational History  . Not on file.    Social History Main Topics  . Smoking status: Never Smoker  . Smokeless tobacco: Never Used  . Alcohol use 8.4 oz/week    14 Glasses of wine per week  . Drug use: No  . Sexual activity: No   Other Topics Concern  . Not on file   Social History Narrative  . No narrative on file    Family Status  Relation Status  . Father Deceased  . Mother Deceased  . Maternal Grandmother Deceased  . Sister Alive  . Brother Alive  . Maternal Grandfather Deceased  . Paternal Grandmother Deceased  . Paternal Grandfather Deceased   Family History  Problem Relation Age of Onset  . Cancer Father 78    dec-stomach ca  . Hypertension Mother   . Renal Disease Mother 93    dec-renal failure  . Hypertension Maternal Grandmother   . Heart attack Maternal Grandmother     ROS:  Full 14 point review of systems complete and found to be negative unless listed above.  Physical Exam: Blood pressure 136/81, pulse 69, temperature 98 F (36.7 C), temperature source Oral, resp. rate 16, height 5\' 6"  (1.676 m), weight 161 lb 9.6 oz (73.3 kg), last menstrual period 08/11/1988, SpO2 98 %.  General: Well developed, well nourished, female in no acute distress, appears younger than stated age. Head: Eyes PERRLA, No xanthomas.   Normocephalic and atraumatic, oropharynx without edema or exudate.  Lungs: CTAB Heart: HRRR S1 S2, no rub/gallop, Heart regular rate and rhythm with S1, S2 no murmur. pulses are 2+ extrem.   Neck: No carotid bruits. No lymphadenopathy. No JVD. Abdomen: Bowel sounds present, abdomen soft and non-tender without masses or hernias noted. Msk:  No spine or cva tenderness. No weakness, no joint deformities or effusions. Extremities: No clubbing or cyanosis.  No LE edema.  Neuro: Alert and oriented X 3. No focal deficits noted. Psych:  Good affect, responds appropriately Skin: No rashes or lesions noted.  Labs:   Lab Results  Component Value Date   WBC 9.1 12/06/2016   HGB 13.7  12/06/2016   HCT 41.9 12/06/2016   MCV 93.3 12/06/2016   PLT 204 12/06/2016   No results for input(s): INR in the last 72 hours.  Recent Labs Lab 12/06/16 2316  NA 137  K 4.1  CL 103  CO2 25  BUN 13  CREATININE 0.99  CALCIUM 9.1  PROT 6.0*  BILITOT 0.6  ALKPHOS 47  ALT 16  AST 27  GLUCOSE 143*  ALBUMIN 3.7   No results found for: MG  Recent Labs  12/07/16 0258  TROPONINI <0.03    Recent Labs  12/06/16 2333  TROPIPOC 0.00   No results found for: PROBNP Lab Results  Component Value Date   CHOL 194 01/18/2016   HDL 80 01/18/2016   LDLCALC 105 01/18/2016   TRIG 44 01/18/2016   Lab Results  Component Value Date   DDIMER 0.33 09/25/2016   Lipase  Date/Time Value Ref Range Status  12/06/2016 11:11 PM 24 11 - 51 U/L Final   No results found for: TSH, T4TOTAL, T3FREE, THYROIDAB No results found for: VITAMINB12, FOLATE, FERRITIN, TIBC, IRON, RETICCTPCT  Echo: none  Cath 12/2014 Left Heart Cath and Coronary Angiography  Conclusion    Prox RCA lesion, 20% stenosed.  Ost LAD lesion, 20% stenosed.  Ost LAD to Prox LAD lesion, 20% stenosed.   1. Mild non-obstructive CAD 2. Normal LV systolic function     ECG: sinus bradycardia HR  56, IRBBB  - personally reviewed  TELE: NSR  - personally reviewed  Radiology:  Dg Chest 2 View  Result Date: 12/06/2016 CLINICAL DATA:  Syncope. History of hypertension and atrial fibrillation. EXAM: CHEST  2 VIEW COMPARISON:  Chest radiograph September 25, 2016 FINDINGS: Cardiac silhouette is mildly enlarged and unchanged. Mediastinal silhouette is nonsuspicious. No pleural effusion or focal consolidation. Strandy densities LEFT lung base. No pneumothorax. Soft tissue planes and included osseous structures are nonsuspicious. Osteopenia. IMPRESSION: Stable cardiomegaly and LEFT lung base atelectasis. Electronically Signed   By: Elon Alas M.D.   On: 12/06/2016 23:29   Ct Head Wo Contrast  Result Date:  12/07/2016 CLINICAL DATA:  Witnessed syncopal episode at table tonight. No head injury. Feeling unwell. History of atrial fibrillation, hypertension. EXAM: CT HEAD WITHOUT CONTRAST TECHNIQUE: Contiguous axial images were obtained from the base of the skull through the vertex without intravenous contrast. COMPARISON:  MRI of the head March 04, 2013 FINDINGS: BRAIN: No intraparenchymal hemorrhage, mass effect nor midline shift. The ventricles and sulci are normal for age. Patchy supratentorial white matter hypodensities less than expected for patient's age, though non-specific are most compatible with chronic small vessel ischemic disease. No acute large vascular territory infarcts. No abnormal extra-axial fluid collections. Basal cisterns are patent. VASCULAR: Mild calcific atherosclerosis of the carotid siphons. SKULL: No skull fracture. No significant scalp soft tissue swelling. SINUSES/ORBITS: Paranasal sinus mucosal thickening with small LEFT maxillary sinus air-fluid level. Mastoid air cells are well aerated. The included ocular globes and orbital contents are non-suspicious. Status post RIGHT ocular lens implant. OTHER: None. IMPRESSION: Negative noncontrast CT HEAD for age. Electronically Signed   By: Elon Alas M.D.   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   Syncope: occurred in the setting of severe back  pain after taking SL NTG. She has been under tremendous stress recently and takes lorazepam in the AMs. No seizure like activity or post ictal state. No prodrome except  for feeling tingly all over. Tele has been unremarkable except for sinus bradycardia. 2D ECHO pending. Likely non cardiac and vagally mediated. Dr. Johnsie Cancel to follow  Back pain: this has been intermittent and ongoing for years. Felt to possibly be related to myocarditis or perhaps occluded a very small branch that was not seen at cath. Cardiac catheterization in 2016 was essentially normal. She was found have very minimal coronary artery disease. Troponin has been negative this admission. She has imdur but has not taken it yet because she wanted to clear with Dr. Acie Fredrickson first.   PAT/PAF: has been quiecent for many years on propafenone. She had some sinus bradycardia and diltiazem was discontinued. Followed by Dr. Ola Spurr (EP) at [facility]. On ASA 325mg  daily. Previously took coumadin but could not tolerate it. CHADSVASC of at least 4 (HTN, age, F sex).  HTN: BP under good control  HLD: continue statin    Signed: Angelena Form, PA-C 12/07/2016 7:34 AM  Pager [phone]  Co-Sign MD  Patient examined chart reviewed. Vasovagal type syncope by history . Chronic back pain and stress from husbands medical issues. Previous cath with no significant CAD. Distant history of PAT/PAF on propofenone with normal QT And ECG no high grade heart block. Review of telemetry with no arrhythmia Ok to d/c home has outpatient f/u with Dr Acie Fredrickson next week aready.  Exam with clear lungs no murmur no edema getting ready to walk with PT not postural  Jenkins Rouge

## 2016-12-07 NOTE — Progress Notes (Signed)
  Echocardiogram 2D Echocardiogram has been performed.  Katherine Cardenas 12/07/2016, 2:48 PM

## 2016-12-07 NOTE — Progress Notes (Signed)
Was informed by CCMD that patient had 1.85sec - SR and 2.30sec SB pauses.Patient asymptomatic, sitting on the chair ,no complaints of any discomfort. Dr. Candiss Norse made aware and ordered to let Cardiology know. Meng Pa paged and made aware, awaiting to call back.

## 2016-12-07 NOTE — Progress Notes (Signed)
Spoke with [facility], came to see patient.

## 2016-12-07 NOTE — Progress Notes (Signed)
HR dropped to 40, Cardiology Pa made aware. No new orders. Will monitor patient accordingly.

## 2016-12-07 NOTE — Progress Notes (Signed)
PROGRESS NOTE                                                                                                                                                                                                             Patient Demographics:    Katherine Cardenas, is a 76 y.o. female, DOB - 1941-06-24, QMG:[id number]  Admit date - 12/06/2016   Admitting Physician Vianne Bulls, MD  Outpatient Primary MD for the patient is Mathews Argyle, MD  LOS - 0  Chief Complaint  Patient presents with  . Loss of Consciousness  . Fatigue       Brief Narrative  Katherine Cardenas is a 76 y.o. female with medical history significant for anxiety, paroxysmal atrial fibrillation, hyperlipidemia, and mild coronary artery disease by cath in 2016 presents to the emergency department for evaluation of a syncopal episode   Subjective:    [name] today has, No headache, No chest pain, No abdominal pain - No Nausea, No new weakness tingling or numbness, No Cough - SOB.    Assessment  & Plan :    1.Syncopal episode. Etiology unclear, however she says she felt exhausted from early morning of this episode, thereafter she worked in the yard and continued to feel exhausted, she subsequently had reoccurrence of her intermittent chronic back pain for which she somehow took some sublingual nitroglycerin after which she passed out/syncopized. Her head CT was unremarkable, she had no focal deficits, initial EKG and troponin negative, echo pending, not orthostatic. Could have been mildly dehydrated and hypotensive at the time of event and the actual event likely was precipitated by her taking sublingual nitroglycerin for back pain.  Continue gentle hydration, await echocardiogram, increase activity, monitor orthostatics in stable discharge in the morning. Cleared by cardiology.   2. Paroxysmal atrial fibrillation, sinus bradycardia, [place] vasc 2 score of  4. She follows with cardiology outpatient, seen and cleared here by cardiology, has pending appointment with Dr. Acie Fredrickson which she will keep in the coming few days. Stable on telemetry, echo pending, per cardiology okay to discharge, continue Norvasc, on no rate controlling agents, on aspirin per cardiology which will be continued.  3. CAD. EKG unremarkable, troponin negative, continue aspirin and statin for secondary prevention. Troponin first set negative check 1 more set an echo pending as well.  4. Intermittent chronic  mid upper back pain. Defer workup to PCP.  5. Dyslipidemia. I'll statin continue.  6. Mild chronic anxiety. As needed Ativan, follow with PCP for long-term SRI use.    Diet : Diet Heart Room service appropriate? Yes; Fluid consistency: Thin    Family Communication  :  None  Code Status :  Full  Disposition Plan  :  Home in am  Consults  :  Cards  Procedures  :    TTE  CT head -ve  DVT Prophylaxis  :  Lovenox    Lab Results  Component Value Date   PLT 204 12/06/2016    Inpatient Medications  Scheduled Meds: . amLODipine  2.5 mg Oral QHS  . [START ON 12/08/2016] aspirin  325 mg Oral Daily  . cholecalciferol  1,000 Units Oral QHS  . enoxaparin (LOVENOX) injection  40 mg Subcutaneous Daily  . LORazepam  1 mg Oral Daily  . magnesium oxide  400 mg Oral Daily  . omega-3 acid ethyl esters  1,000 mg Oral Daily  . polyvinyl alcohol   Both Eyes BID  . propafenone  150 mg Oral Q8H  . [START ON 12/08/2016] rosuvastatin  5 mg Oral Once per day on Mon Thu  . sodium chloride flush  3 mL Intravenous Q12H   Continuous Infusions: . sodium chloride     PRN Meds:.acetaminophen **OR** acetaminophen, alum & mag hydroxide-simeth, HYDROcodone-acetaminophen, ondansetron **OR** ondansetron (ZOFRAN) IV, polyethylene glycol  Antibiotics  :    Anti-infectives    None         Objective:   Vitals:   12/07/16 0100 12/07/16 0205 12/07/16 0210 12/07/16 0900  BP: 136/81    (!) 151/75  Pulse: 69     Resp: 16     Temp:  98 F (36.7 C)    TempSrc:  Oral    SpO2: 98%  98%   Weight:   73.3 kg (161 lb 9.6 oz)   Height:   5\' 6"  (1.676 m)     Wt Readings from Last 3 Encounters:  12/07/16 73.3 kg (161 lb 9.6 oz)  09/25/16 70.3 kg (155 lb)  01/18/16 66.7 kg (147 lb)     Intake/Output Summary (Last 24 hours) at 12/07/16 1047 Last data filed at 12/07/16 0700  Gross per 24 hour  Intake            23.75 ml  Output             1900 ml  Net         -1876.25 ml     Physical Exam  Awake Alert, Oriented X 3, No new F.N deficits, Normal affect Reynolds.AT,PERRAL Supple Neck,No JVD, No cervical lymphadenopathy appriciated.  Symmetrical Chest wall movement, Good air movement bilaterally, CTAB RRR,No Gallops,Rubs or new Murmurs, No Parasternal Heave +ve B.Sounds, Abd Soft, No tenderness, No organomegaly appriciated, No rebound - guarding or rigidity. No Cyanosis, Clubbing or edema, No new Rash or bruise       Data Review:    CBC  Recent Labs Lab 12/06/16 2316  WBC 9.1  HGB 13.7  HCT 41.9  PLT 204  MCV 93.3  MCH 30.5  MCHC 32.7  RDW 13.7  LYMPHSABS 2.5  MONOABS 0.7  EOSABS 0.1  BASOSABS 0.0    Chemistries   Recent Labs Lab 12/06/16 2316  NA 137  K 4.1  CL 103  CO2 25  GLUCOSE 143*  BUN 13  CREATININE 0.99  CALCIUM 9.1  AST 27  ALT 16  ALKPHOS 47  BILITOT 0.6   ------------------------------------------------------------------------------------------------------------------ No results for input(s): CHOL, HDL, LDLCALC, TRIG, CHOLHDL, LDLDIRECT in the last 72 hours.  No results found for: HGBA1C ------------------------------------------------------------------------------------------------------------------ No results for input(s): TSH, T4TOTAL, T3FREE, THYROIDAB in the last 72 hours.  Invalid input(s): FREET3 ------------------------------------------------------------------------------------------------------------------ No  results for input(s): VITAMINB12, FOLATE, FERRITIN, TIBC, IRON, RETICCTPCT in the last 72 hours.  Coagulation profile No results for input(s): INR, PROTIME in the last 168 hours.  No results for input(s): DDIMER in the last 72 hours.  Cardiac Enzymes  Recent Labs Lab 12/07/16 0258  TROPONINI <0.03   ------------------------------------------------------------------------------------------------------------------ No results found for: BNP  Micro Results No results found for this or any previous visit (from the past 240 hour(s)).  Radiology Reports Dg Chest 2 View  Result Date: 12/06/2016 CLINICAL DATA:  Syncope. History of hypertension and atrial fibrillation. EXAM: CHEST  2 VIEW COMPARISON:  Chest radiograph September 25, 2016 FINDINGS: Cardiac silhouette is mildly enlarged and unchanged. Mediastinal silhouette is nonsuspicious. No pleural effusion or focal consolidation. Strandy densities LEFT lung base. No pneumothorax. Soft tissue planes and included osseous structures are nonsuspicious. Osteopenia. IMPRESSION: Stable cardiomegaly and LEFT lung base atelectasis. Electronically Signed   By: Elon Alas M.D.   On: 12/06/2016 23:29   Ct Head Wo Contrast  Result Date: 12/07/2016 CLINICAL DATA:  Witnessed syncopal episode at table tonight. No head injury. Feeling unwell. History of atrial fibrillation, hypertension. EXAM: CT HEAD WITHOUT CONTRAST TECHNIQUE: Contiguous axial images were obtained from the base of the skull through the vertex without intravenous contrast. COMPARISON:  MRI of the head March 04, 2013 FINDINGS: BRAIN: No intraparenchymal hemorrhage, mass effect nor midline shift. The ventricles and sulci are normal for age. Patchy supratentorial white matter hypodensities less than expected for patient's age, though non-specific are most compatible with chronic small vessel ischemic disease. No acute large vascular territory infarcts. No abnormal extra-axial fluid collections.  Basal cisterns are patent. VASCULAR: Mild calcific atherosclerosis of the carotid siphons. SKULL: No skull fracture. No significant scalp soft tissue swelling. SINUSES/ORBITS: Paranasal sinus mucosal thickening with small LEFT maxillary sinus air-fluid level. Mastoid air cells are well aerated. The included ocular globes and orbital contents are non-suspicious. Status post RIGHT ocular lens implant. OTHER: None. IMPRESSION: Negative noncontrast CT HEAD for age. Electronically Signed   By: Elon Alas M.D.   On: 12/07/2016 00:56    Time Spent in minutes  30   Lala Lund M.D on 12/07/2016 at 10:47 AM  Between 7am to 7pm - Pager - [phone] ( page via [email or web address], text pages only, please mention full 10 digit call back number). After 7pm go to [email or web address] - password [facility]

## 2016-12-07 NOTE — ED Notes (Signed)
Pt transported to Ct on baby monitor.

## 2016-12-07 NOTE — ED Notes (Signed)
Pt ambulate to restroom with this Rn. Pt states that she stills feels "odd in my head" Pt back in bed, NAD

## 2016-12-07 NOTE — Progress Notes (Signed)
Paged by nurse regarding bradycardia, occasional pause up to 2.3 sec. Bradycardia in the setting of PACs followed by slow narrow complex rhythm. Hard to identify p wave, possible junctional escape beat. Discussed with Dr. Johnsie Cancel, will discontinue Rhythmol (used for afib). Otherwise, she is not on AV nodal blocking agent.   EP to see tomorrow.   Hilbert Corrigan PA Pager: [phone]

## 2016-12-07 NOTE — Progress Notes (Signed)
Received a call from CCMD again,pts HR dropped to high 30's.  Meng Pa in the unit, aware.

## 2016-12-07 NOTE — Progress Notes (Signed)
Patient HR on high 60's low 70's on SR.  No complaints od any discomfort at this time. Family at bedside. Will endorsed accordingly.

## 2016-12-07 NOTE — Evaluation (Signed)
Physical Therapy Evaluation Patient Details Name: Katherine Cardenas MRN: [id number] DOB: 1941-05-26 Today's Date: 12/07/2016   History of Present Illness  Pt is a 76 yo female admitted through ED on 12/06/16 following a syncopal episode. PMH significant for CAD, HLD, A-fib, bradycardia.   Clinical Impression  Pt presents with the above diagnosis and below deficits for therapy evaluation. Prior to admission, pt was completely independent. Pt has a history of falls in the past related to difficulty initiating movement and feeling unsteady with changes in direction. Pt is able to perform mobility with min guard to supervision this session with some balance deficits noted with changes in direction. Pt will benefit from continued acute PT services in order to address the below deficits including stair negotiation prior to discharge to venue recommended below.     Follow Up Recommendations Outpatient PT;Other (comment) (Neuro outpatient for balance)    Equipment Recommendations  None recommended by PT    Recommendations for Other Services       Precautions / Restrictions Precautions Precautions: Fall Restrictions Weight Bearing Restrictions: No      Mobility  Bed Mobility Overal bed mobility: Independent             General bed mobility comments: sitting up in bed when PT arrives, able to get OOB without assistance  Transfers Overall transfer level: Modified independent               General transfer comment: able to stand from EOB without assistance  Ambulation/Gait Ambulation/Gait assistance: Supervision;Min guard Ambulation Distance (Feet): 120 Feet Assistive device: None Gait Pattern/deviations: Step-through pattern;Decreased step length - right;Decreased step length - left Gait velocity: decreased Gait velocity interpretation: Below normal speed for age/gender General Gait Details: decreased cadence and requires min guard for majority of gait. Pt is grabbing onto  railings occasionally throughout gait  Stairs            Wheelchair Mobility    Modified Rankin (Stroke Patients Only)       Balance Overall balance assessment: History of Falls;Needs assistance Sitting-balance support: No upper extremity supported;Feet supported Sitting balance-Leahy Scale: Normal     Standing balance support: No upper extremity supported;During functional activity Standing balance-Leahy Scale: Fair                               Pertinent Vitals/Pain Pain Assessment: No/denies pain    Home Living Family/patient expects to be discharged to:: Private residence Living Arrangements: Spouse/significant other;Children (daugther ) Available Help at Discharge: Family;Available 24 hours/day Type of Home: House Home Access: Stairs to enter Entrance Stairs-Rails: None (coounter top ) Entrance Stairs-Number of Steps: 2-3 steps through garage Home Layout: Multi-level;Able to live on main level with bedroom/bathroom Home Equipment: Other (comment) (walking stick)      Prior Function Level of Independence: Independent         Comments: performed all mobility independently     Hand Dominance   Dominant Hand: Right    Extremity/Trunk Assessment   Upper Extremity Assessment Upper Extremity Assessment: Overall WFL for tasks assessed    Lower Extremity Assessment Lower Extremity Assessment: Overall WFL for tasks assessed    Cervical / Trunk Assessment Cervical / Trunk Assessment: Normal  Communication   Communication: No difficulties  Cognition Arousal/Alertness: Awake/alert Behavior During Therapy: WFL for tasks assessed/performed Overall Cognitive Status: Within Functional Limits for tasks assessed  General Comments      Exercises     Assessment/Plan    PT Assessment Patient needs continued PT services  PT Problem List Decreased activity tolerance;Decreased  balance;Decreased mobility       PT Treatment Interventions Gait training;Stair training;Functional mobility training;Therapeutic activities;Therapeutic exercise;Balance training    PT Goals (Current goals can be found in the Care Plan section)  Acute Rehab PT Goals Patient Stated Goal: to improve her balance PT Goal Formulation: With patient Time For Goal Achievement: 12/14/16 Potential to Achieve Goals: Good    Frequency Min 3X/week   Barriers to discharge        Co-evaluation               End of Session Equipment Utilized During Treatment: Gait belt Activity Tolerance: Patient tolerated treatment well Patient left: with call bell/phone within reach;Other (comment) (in bathroom with NT notified) Nurse Communication: Mobility status PT Visit Diagnosis: Difficulty in walking, not elsewhere classified (R26.2);Unsteadiness on feet (R26.81)    Time: 0998-3382 PT Time Calculation (min) (ACUTE ONLY): 49 min   Charges:   PT Evaluation $PT Eval Low Complexity: 1 Procedure PT Treatments $Gait Training: 8-22 mins $Therapeutic Activity: 8-22 mins   PT G Codes:   PT G-Codes **NOT FOR INPATIENT CLASS** Functional Assessment Tool Used: AM-PAC 6 Clicks Basic Mobility;Clinical judgement Functional Limitation: Mobility: Walking and moving around Mobility: Walking and Moving Around Current Status (N0539): At least 20 percent but less than 40 percent impaired, limited or restricted Mobility: Walking and Moving Around Goal Status [phone]): 0 percent impaired, limited or restricted    Scheryl Marten PT, DPT  [phone]   Shanon Rosser 12/07/2016, 9:28 AM

## 2016-12-08 DIAGNOSIS — R001 Bradycardia, unspecified: Secondary | ICD-10-CM

## 2016-12-08 DIAGNOSIS — I471 Supraventricular tachycardia: Secondary | ICD-10-CM

## 2016-12-08 LAB — GLUCOSE, CAPILLARY: Glucose-Capillary: 104 mg/dL — ABNORMAL HIGH (ref 65–99)

## 2016-12-08 MED ORDER — VITAMIN B-12 1000 MCG PO TABS
1000.0000 ug | ORAL_TABLET | Freq: Every day | ORAL | Status: DC
  Administered 2016-12-08 – 2016-12-09 (×2): 1000 ug via ORAL
  Filled 2016-12-08 (×2): qty 1

## 2016-12-08 MED ORDER — HYDROCODONE-ACETAMINOPHEN 5-325 MG PO TABS: 1.0000 | ORAL_TABLET | Freq: Four times a day (QID) | ORAL | Status: DC | PRN

## 2016-12-08 NOTE — Discharge Summary (Signed)
Katherine Cardenas UGQ:[id number] DOB: Oct 21, 1940 DOA: 12/06/2016  PCP: Mathews Argyle, MD  Admit date: 12/06/2016  Discharge date: 12/09/2016  Admitted From: Home  Disposition:  Home   Recommendations for Outpatient Follow-up:   Follow up with PCP in 1-2 weeks  PCP Please obtain BMP/CBC, 2 view CXR in 1week,  (see Discharge instructions)   PCP Please follow up on the following pending results: None   Home Health: None   Equipment/Devices: None  Consultations: Cards,EP Discharge Condition: Fair   CODE STATUS: Full   Diet Recommendation: Heart Healthy    Chief Complaint  Patient presents with  . Loss of Consciousness  . Fatigue     Brief history of present illness from the day of admission and additional interim summary    Katherine Cardenas a 76 y.o.femalewith medical history significant for anxiety, paroxysmal atrial fibrillation, hyperlipidemia, and mild coronary artery disease by cath in 2016 presents to the emergency department for evaluation of a syncopal episode                                                                  Hospital Course   1.Syncopal episode. Most likely due to runs of bradycardia and sinus positives due to her being on propafenone, seen by cardiology and EP here, offending medications stopped stable on telemetry since then and symptom free, seen by cardiology and EP and cleared for home discharge today, TSH and echocardiogram stable.   2. Paroxysmal atrial fibrillation, sinus bradycardia, [place] vasc 2 score of 4. She follows with cardiology outpatient, seen and cleared here by cardiology, has pending appointment with Dr. Acie Fredrickson which she will keep in the coming few days and have also requested to follow with EP physician Dr. Ola Spurr within a week, per EP here patient may  require 30 day event monitor which her EP physician Dr. Ola Spurr and will arrange, Dr. Caryl Comes has discussed her case with Dr. Ola Spurr already. Continue her on aspirin along with Norvasc, continue to use Cardizem when necessary as rate controlling agent.  3. CAD. EKG unremarkable, troponin negative, continue aspirin and statin for secondary prevention. Stable echocardiogram with preserved wall motion and EF.  4. Intermittent chronic mid upper back pain. Defer workup to PCP.  5. Dyslipidemia. On statin continue.  6. Mild chronic anxiety. As needed Ativan, follow with PCP for long-term SRI use.     Discharge diagnosis     Principal Problem:   Syncope Active Problems:   CAD (coronary artery disease)   Hyperlipidemia   Sinus bradycardia on ECG   AF (paroxysmal atrial fibrillation) ([place])    Discharge instructions    Discharge Instructions    Diet - low sodium heart healthy    Complete by:  As directed    Discharge instructions    Complete  by:  As directed    Follow with Primary MD Mathews Argyle, MD and your cardiologist Dr. Ola Spurr within 1 week    Get CBC, CMP, 2 view Chest X ray checked  by Primary MD or SNF MD in 5-7 days ( we routinely change or add medications that can affect your baseline labs and fluid status, therefore we recommend that you get the mentioned basic workup next visit with your PCP, your PCP may decide not to get them or add new tests based on their clinical decision)  Activity: As tolerated with Full fall precautions use walker/cane & assistance as needed  Disposition Home   Diet:  Heart Healthy    For Heart failure patients - Check your Weight same time everyday, if you gain over 2 pounds, or you develop in leg swelling, experience more shortness of breath or chest pain, call your Primary MD immediately. Follow Cardiac Low Salt Diet and 1.5 lit/day fluid restriction.  On your next visit with your primary care physician please Get  Medicines reviewed and adjusted.  Please request your Prim.MD to go over all Hospital Tests and Procedure/Radiological results at the follow up, please get all Hospital records sent to your Prim MD by signing hospital release before you go home.  If you experience worsening of your admission symptoms, develop shortness of breath, life threatening emergency, suicidal or homicidal thoughts you must seek medical attention immediately by calling 911 or calling your MD immediately  if symptoms less severe.  You Must read complete instructions/literature along with all the possible adverse reactions/side effects for all the Medicines you take and that have been prescribed to you. Take any new Medicines after you have completely understood and accpet all the possible adverse reactions/side effects.   Do not drive, operate heavy machinery, perform activities at heights, swimming or participation in water activities or provide baby sitting services if your were admitted for syncope or siezures until you have seen by Primary MD or a Neurologist and advised to do so again.  Do not drive when taking Pain medications.    Do not take more than prescribed Pain, Sleep and Anxiety Medications  Special Instructions: If you have smoked or chewed Tobacco  in the last 2 yrs please stop smoking, stop any regular Alcohol  and or any Recreational drug use.  Wear Seat belts while driving.   Please note  You were cared for by a hospitalist during your hospital stay. If you have any questions about your discharge medications or the care you received while you were in the hospital after you are discharged, you can call the unit and asked to speak with the hospitalist on call if the hospitalist that took care of you is not available. Once you are discharged, your primary care physician will handle any further medical issues. Please note that NO REFILLS for any discharge medications will be authorized once you are  discharged, as it is imperative that you return to your primary care physician (or establish a relationship with a primary care physician if you do not have one) for your aftercare needs so that they can reassess your need for medications and monitor your lab values.   Increase activity slowly    Complete by:  As directed       Discharge Medications   Allergies as of 12/09/2016      Reactions   Augmentin [amoxicillin-pot Clavulanate] Hives   Ciprofloxacin Palpitations, Other (See Comments)   "irregular heart beat"   Diltiazem  Other (See Comments)   Pulse dropped too low   Epinephrine Other (See Comments)   "NOVOCAIN"--could be the epinephrine----patient didn't tolerate-SYNCOPE    Novocain [procaine] Other (See Comments)   could be the epinephrine----patient didn't tolerate-SYNCOPE   Avapro [irbesartan] Swelling   Shrimp [shellfish Allergy] Hives   Iohexol Itching, Rash    Code: RASH, Desc: patient states hx of possible reaction to ivp contrast years ago   Penicillins Hives   Has patient had a PCN reaction causing immediate rash, facial/tongue/throat swelling, SOB or lightheadedness with hypotension: Yes Has patient had a PCN reaction causing severe rash involving mucus membranes or skin necrosis: No Has patient had a PCN reaction that required hospitalization No Has patient had a PCN reaction occurring within the last 10 years: No If all of the above answers are "NO", then may proceed with Cephalosporin use.   Zithromax [azithromycin] Other (See Comments)   Cannot take with propafenone      Medication List    STOP taking these medications   propafenone 150 MG tablet Commonly known as:  RYTHMOL     TAKE these medications   amLODipine 5 MG tablet Commonly known as:  NORVASC Take 1 tablet (5 mg total) by mouth at bedtime. What changed:  medication strength  how much to take   aspirin 325 MG tablet Take 325 mg by mouth daily.   cholecalciferol 1000 units  tablet Commonly known as:  VITAMIN D Take 1,000 Units by mouth at bedtime.   diltiazem 30 MG tablet Commonly known as:  CARDIZEM Take 30 mg by mouth daily as needed (for heart rate >100).   LORazepam 1 MG tablet Commonly known as:  ATIVAN Take 1 mg by mouth daily.   Magnesium 400 MG Caps Take 400 mg by mouth daily.   methocarbamol 500 MG tablet Commonly known as:  ROBAXIN Take 1 tablet (500 mg total) by mouth every 8 (eight) hours as needed.   nitroGLYCERIN 0.4 MG SL tablet Commonly known as:  NITROSTAT Place 0.4 mg under the tongue every 5 (five) minutes x 3 doses as needed for chest pain.   Omega-3 1400 MG Caps Take 1,400 mg by mouth daily.   RESVERATROL-GRAPE PO Take 1 tablet by mouth at bedtime.   rosuvastatin 5 MG tablet Commonly known as:  CRESTOR Take 1 tablet (5 mg total) by mouth 2 (two) times a week.   SYSTANE OP Place 1 drop into both eyes 2 (two) times daily.   Turmeric 500 MG Caps Take 500 mg by mouth at bedtime.   vitamin C 1000 MG tablet Take 1,000 mg by mouth daily before supper.       Follow-up Information    Mathews Argyle, MD. Schedule an appointment as soon as possible for a visit in 1 week(s).   Specialty:  Internal Medicine Why:  Also follow with your electrophysiologist Dr. Ola Spurr within a week Contact information: [address] [place] [address] [place] [address] [phone]           Major procedures and Radiology Reports - PLEASE review detailed and final reports thoroughly  -     TTE - Left ventricle: The cavity size was normal. Wall thickness was increased in a pattern of mild LVH. Systolic function was normal. The estimated ejection fraction was in the range of 60% to 65%. Wall motion was normal; there were no regional wall motion abnormalities. The study is not technically sufficient to allow evaluation of LV diastolic function. - Aortic valve:  There was trivial regurgitation. - Mitral valve: There was mild  regurgitation. - Atrial septum: No defect or patent foramen ovale was identified. - Tricuspid valve: There was mild-moderate regurgitation. - Pulmonary arteries: PA peak pressure: 32 mm Hg (S).   Dg Chest 2 View  Result Date: 12/06/2016 CLINICAL DATA:  Syncope. History of hypertension and atrial fibrillation. EXAM: CHEST  2 VIEW COMPARISON:  Chest radiograph September 25, 2016 FINDINGS: Cardiac silhouette is mildly enlarged and unchanged. Mediastinal silhouette is nonsuspicious. No pleural effusion or focal consolidation. Strandy densities LEFT lung base. No pneumothorax. Soft tissue planes and included osseous structures are nonsuspicious. Osteopenia. IMPRESSION: Stable cardiomegaly and LEFT lung base atelectasis. Electronically Signed   By: Elon Alas M.D.   On: 12/06/2016 23:29   Ct Head Wo Contrast  Result Date: 12/07/2016 CLINICAL DATA:  Witnessed syncopal episode at table tonight. No head injury. Feeling unwell. History of atrial fibrillation, hypertension. EXAM: CT HEAD WITHOUT CONTRAST TECHNIQUE: Contiguous axial images were obtained from the base of the skull through the vertex without intravenous contrast. COMPARISON:  MRI of the head March 04, 2013 FINDINGS: BRAIN: No intraparenchymal hemorrhage, mass effect nor midline shift. The ventricles and sulci are normal for age. Patchy supratentorial white matter hypodensities less than expected for patient's age, though non-specific are most compatible with chronic small vessel ischemic disease. No acute large vascular territory infarcts. No abnormal extra-axial fluid collections. Basal cisterns are patent. VASCULAR: Mild calcific atherosclerosis of the carotid siphons. SKULL: No skull fracture. No significant scalp soft tissue swelling. SINUSES/ORBITS: Paranasal sinus mucosal thickening with small LEFT maxillary sinus air-fluid level. Mastoid air cells are well aerated. The included ocular globes and orbital contents are non-suspicious. Status  post RIGHT ocular lens implant. OTHER: None. IMPRESSION: Negative noncontrast CT HEAD for age. Electronically Signed   By: Elon Alas M.D.   On: 12/07/2016 00:56    Micro Results     No results found for this or any previous visit (from the past 240 hour(s)).  Today   Subjective    Katherine Cardenas today has no headache,no chest abdominal pain,no new weakness tingling or numbness, feels much better wants to go home today.     Objective   Blood pressure (!) 154/74, pulse (!) 57, temperature 99.3 F (37.4 C), temperature source Oral, resp. rate 17, height 5\' 6"  (1.676 m), weight 71.5 kg (157 lb 11.2 oz), last menstrual period 08/11/1988, SpO2 98 %.   Intake/Output Summary (Last 24 hours) at 12/09/16 0926 Last data filed at 12/09/16 0500  Gross per 24 hour  Intake              480 ml  Output             1200 ml  Net             -720 ml    Exam Awake Alert, Oriented x 3, No new F.N deficits, Normal affect [place].AT,PERRAL Supple Neck,No JVD, No cervical lymphadenopathy appriciated.  Symmetrical Chest wall movement, Good air movement bilaterally, CTAB RRR,No Gallops,Rubs or new Murmurs, No Parasternal Heave +ve B.Sounds, Abd Soft, Non tender, No organomegaly appriciated, No rebound -guarding or rigidity. No Cyanosis, Clubbing or edema, No new Rash or bruise   Data Review   CBC w Diff:  Lab Results  Component Value Date   WBC 9.1 12/06/2016   HGB 13.7 12/06/2016   HCT 41.9 12/06/2016   PLT 204 12/06/2016   LYMPHOPCT 28 12/06/2016   MONOPCT 8 12/06/2016  EOSPCT 1 12/06/2016   BASOPCT 0 12/06/2016    CMP:  Lab Results  Component Value Date   NA 137 12/06/2016   K 4.1 12/06/2016   CL 103 12/06/2016   CO2 25 12/06/2016   BUN 13 12/06/2016   CREATININE 0.99 12/06/2016   CREATININE 0.93 01/18/2016   PROT 6.0 (L) 12/06/2016   ALBUMIN 3.7 12/06/2016   BILITOT 0.6 12/06/2016   ALKPHOS 47 12/06/2016   AST 27 12/06/2016   ALT 16 12/06/2016  .   Total Time in  preparing paper work, data evaluation and todays exam - 39 minutes  Lala Lund M.D on 12/09/2016 at 9:26 AM  [facility]   Office  [phone]

## 2016-12-08 NOTE — Progress Notes (Signed)
PROGRESS NOTE                                                                                                                                                                                                             Patient Demographics:    Katherine Cardenas, is a 76 y.o. female, DOB - [birth year], GLO:[id number]  Admit date - 12/06/2016   Admitting Physician Vianne Bulls, MD  Outpatient Primary MD for the patient is Mathews Argyle, MD  LOS - 1  Chief Complaint  Patient presents with  . Loss of Consciousness  . Fatigue       Brief Narrative  Katherine Cardenas is a 76 y.o. female with medical history significant for anxiety, paroxysmal atrial fibrillation, hyperlipidemia, and mild coronary artery disease by cath in 2016 presents to the emergency department for evaluation of a syncopal episode   Subjective:   Patient in bed, appears comfortable, denies any headache, no fever, no chest pain or pressure, no shortness of breath , no abdominal pain. No focal weakness.   Assessment  & Plan :    1.Syncopal episode. Etiology unclear, however she says she felt exhausted from early morning of this episode, thereafter she worked in the yard and continued to feel exhausted, she subsequently had reoccurrence of her intermittent chronic back pain for which she somehow took some sublingual nitroglycerin after which she passed out/syncopized. Her head CT was unremarkable, she had no focal deficits, initial EKG and troponin negative, echo pending, not orthostatic. Could have been mildly dehydrated and hypotensive at the time of event and the actual event likely was precipitated by her taking sublingual nitroglycerin for back pain.  Stable echocardiogram, however she had a few 2 sec pauses and an episode of bardycardia to 30s, Cards and EP now following, her propafenone has been held, per EP monitor another day on Tele.   2. Paroxysmal  atrial fibrillation, sinus bradycardia, [place] vasc 2 score of 4. She follows with cardiology outpatient, seen by cardiology,since she had a few 2 sec pauses and an episode of bardycardia to 30s, Cards and EP now following, her propafenone has been held, on aspirin per cardiology which will be continued.  3. CAD. EKG unremarkable, troponin negative, continue aspirin and statin for secondary prevention. Troponin first set negative check 1 more set an echo pending as well.  4. Intermittent chronic mid upper back pain. Defer workup to PCP.  5. Dyslipidemia. I'll statin continue.  6. Mild chronic anxiety. As needed Ativan, follow with PCP for long-term SRI use.    Diet : Diet Heart Room service appropriate? Yes; Fluid consistency: Thin Diet - low sodium heart healthy    Family Communication  :  None  Code Status :  Full  Disposition Plan  :  Home in am  Consults  :  Cards  Procedures  :    TTE TTE - Left ventricle: The cavity size was normal. Wall thickness wasincreased in a pattern of mild LVH. Systolic function was normal. The estimated ejection fraction was in the range of 60% to 65%.Wall motion was normal; there were no regional wall motionabnormalities. The study is not technically sufficient to allowevaluation of LV diastolic function. - Aortic valve: There was trivial regurgitation. - Mitral valve: There was mild regurgitation. - Atrial septum: No defect or patent foramen ovale was identified. - Tricuspid valve: There was mild-moderate regurgitation. - Pulmonary arteries: PA peak pressure: 32 mm Hg (S).  CT head -ve  DVT Prophylaxis  :  Lovenox    Lab Results  Component Value Date   PLT 204 12/06/2016    Inpatient Medications  Scheduled Meds: . amLODipine  2.5 mg Oral QHS  . aspirin  325 mg Oral Daily  . cholecalciferol  1,000 Units Oral QHS  . enoxaparin (LOVENOX) injection  40 mg Subcutaneous Daily  . LORazepam  1 mg Oral Daily  . magnesium oxide  400 mg Oral  Daily  . omega-3 acid ethyl esters  1,000 mg Oral Daily  . polyvinyl alcohol   Both Eyes BID  . sodium chloride flush  3 mL Intravenous Q12H  . vitamin B-12  1,000 mcg Oral Daily   Continuous Infusions:  PRN Meds:.acetaminophen **OR** [DISCONTINUED] acetaminophen, alum & mag hydroxide-simeth, HYDROcodone-acetaminophen, [DISCONTINUED] ondansetron **OR** ondansetron (ZOFRAN) IV, polyethylene glycol  Antibiotics  :    Anti-infectives    None         Objective:   Vitals:   12/08/16 0007 12/08/16 0319 12/08/16 0920 12/08/16 1241  BP: 136/68 (!) 155/85 (!) 150/81 (!) 157/78  Pulse: (!) 57 62 70 61  Resp: 18 18  18   Temp: 98.3 F (36.8 C) 98.2 F (36.8 C)  98.1 F (36.7 C)  TempSrc: Oral Oral  Oral  SpO2: 96% 97%  96%  Weight:  73.1 kg (161 lb 1.6 oz)    Height:        Wt Readings from Last 3 Encounters:  12/08/16 73.1 kg (161 lb 1.6 oz)  09/25/16 70.3 kg (155 lb)  01/18/16 66.7 kg (147 lb)     Intake/Output Summary (Last 24 hours) at 12/08/16 1305 Last data filed at 12/08/16 1245  Gross per 24 hour  Intake          1288.75 ml  Output             2150 ml  Net          -861.25 ml     Physical Exam  Awake Alert, Oriented X 3, No new F.N deficits, Normal affect [place].AT,PERRAL Supple Neck,No JVD, No cervical lymphadenopathy appriciated.  Symmetrical Chest wall movement, Good air movement bilaterally, CTAB RRR,No Gallops,Rubs or new Murmurs, No Parasternal Heave +ve B.Sounds, Abd Soft, No tenderness, No organomegaly appriciated, No rebound - guarding or rigidity. No Cyanosis, Clubbing or edema, No new Rash or bruise  Data Review:    CBC  Recent Labs Lab 12/06/16 2316  WBC 9.1  HGB 13.7  HCT 41.9  PLT 204  MCV 93.3  MCH 30.5  MCHC 32.7  RDW 13.7  LYMPHSABS 2.5  MONOABS 0.7  EOSABS 0.1  BASOSABS 0.0    Chemistries   Recent Labs Lab 12/06/16 2316  NA 137  K 4.1  CL 103  CO2 25  GLUCOSE 143*  BUN 13  CREATININE 0.99  CALCIUM 9.1    AST 27  ALT 16  ALKPHOS 47  BILITOT 0.6   ------------------------------------------------------------------------------------------------------------------ No results for input(s): CHOL, HDL, LDLCALC, TRIG, CHOLHDL, LDLDIRECT in the last 72 hours.  No results found for: HGBA1C ------------------------------------------------------------------------------------------------------------------ No results for input(s): TSH, T4TOTAL, T3FREE, THYROIDAB in the last 72 hours.  Invalid input(s): FREET3 ------------------------------------------------------------------------------------------------------------------ No results for input(s): VITAMINB12, FOLATE, FERRITIN, TIBC, IRON, RETICCTPCT in the last 72 hours.  Coagulation profile No results for input(s): INR, PROTIME in the last 168 hours.  No results for input(s): DDIMER in the last 72 hours.  Cardiac Enzymes  Recent Labs Lab 12/07/16 0258 12/07/16 1200  TROPONINI <0.03 <0.03   ------------------------------------------------------------------------------------------------------------------ No results found for: BNP  Micro Results No results found for this or any previous visit (from the past 240 hour(s)).  Radiology Reports Dg Chest 2 View  Result Date: 12/06/2016 CLINICAL DATA:  Syncope. History of hypertension and atrial fibrillation. EXAM: CHEST  2 VIEW COMPARISON:  Chest radiograph September 25, 2016 FINDINGS: Cardiac silhouette is mildly enlarged and unchanged. Mediastinal silhouette is nonsuspicious. No pleural effusion or focal consolidation. Strandy densities LEFT lung base. No pneumothorax. Soft tissue planes and included osseous structures are nonsuspicious. Osteopenia. IMPRESSION: Stable cardiomegaly and LEFT lung base atelectasis. Electronically Signed   By: Elon Alas M.D.   On: 12/06/2016 23:29   Ct Head Wo Contrast  Result Date: 12/07/2016 CLINICAL DATA:  Witnessed syncopal episode at table tonight. No  head injury. Feeling unwell. History of atrial fibrillation, hypertension. EXAM: CT HEAD WITHOUT CONTRAST TECHNIQUE: Contiguous axial images were obtained from the base of the skull through the vertex without intravenous contrast. COMPARISON:  MRI of the head March 04, 2013 FINDINGS: BRAIN: No intraparenchymal hemorrhage, mass effect nor midline shift. The ventricles and sulci are normal for age. Patchy supratentorial white matter hypodensities less than expected for patient's age, though non-specific are most compatible with chronic small vessel ischemic disease. No acute large vascular territory infarcts. No abnormal extra-axial fluid collections. Basal cisterns are patent. VASCULAR: Mild calcific atherosclerosis of the carotid siphons. SKULL: No skull fracture. No significant scalp soft tissue swelling. SINUSES/ORBITS: Paranasal sinus mucosal thickening with small LEFT maxillary sinus air-fluid level. Mastoid air cells are well aerated. The included ocular globes and orbital contents are non-suspicious. Status post RIGHT ocular lens implant. OTHER: None. IMPRESSION: Negative noncontrast CT HEAD for age. Electronically Signed   By: Elon Alas M.D.   On: 12/07/2016 00:56    Time Spent in minutes  30   Lala Lund M.D on 12/08/2016 at 1:05 PM  Between 7am to 7pm - Pager - [phone] ( page via [email or web address], text pages only, please mention full 10 digit call back number). After 7pm go to [email or web address] - password [email or web address]

## 2016-12-08 NOTE — Progress Notes (Signed)
Physical Therapy Treatment Patient Details Name: Katherine Cardenas MRN: [id number] DOB: 02/18/1941 Today's Date: 12/08/2016    History of Present Illness Pt is a 76 yo female admitted through ED on 12/06/16 following a syncopal episode. PMH significant for CAD, HLD, A-fib, bradycardia.     PT Comments    Pt is very anxious that she is going to miss the cardiologist and so PT saw her in her room for all therapy.  Gait was repeated for shorter trips to manage her PLOF and to assess balance.  Has mod incoordination of LLE and talked with her about using a SPC for safety.  Her acute therapy will be to walk and balance, and will hopefully have a chance to climb steps to get into her house.  Has been walking with no AD and advised her to use SPC.   Follow Up Recommendations  Outpatient PT (Neuro to manage her incoordination  LLE and strengthen hams)     Equipment Recommendations  Cane    Recommendations for Other Services       Precautions / Restrictions Precautions Precautions: Fall Restrictions Weight Bearing Restrictions: No    Mobility  Bed Mobility               General bed mobility comments: up in chair when PT arrived  Transfers Overall transfer level: Needs assistance Equipment used: 1 person hand held assist Transfers: Sit to/from [facility] Sit to Stand: Min assist Stand pivot transfers: Min guard       General transfer comment: requires minor help to stand from lower surface  Ambulation/Gait Ambulation/Gait assistance: Min guard Ambulation Distance (Feet): 40 Feet (x 3) Assistive device: 1 person hand held assist (IV pole) Gait Pattern/deviations: Step-through pattern;Decreased stride length;Wide base of support;Decreased weight shift to left Gait velocity: decreased Gait velocity interpretation: Below normal speed for age/gender General Gait Details: using IV pole for support   Stairs            Wheelchair Mobility    Modified  Rankin (Stroke Patients Only)       Balance Overall balance assessment: History of Falls;Needs assistance Sitting-balance support: Feet supported Sitting balance-Leahy Scale: Good   Postural control: Posterior lean Standing balance support: Single extremity supported Standing balance-Leahy Scale: Fair                              Cognition Arousal/Alertness: Awake/alert Behavior During Therapy: Anxious Overall Cognitive Status: Within Functional Limits for tasks assessed                                        Exercises General Exercises - Lower Extremity Ankle Circles/Pumps: AROM;Both;10 reps Long Arc Quad: AROM;Both;10 reps Heel Slides: AROM;Both;10 reps    General Comments        Pertinent Vitals/Pain Pain Assessment: No/denies pain    Home Living                      Prior Function            PT Goals (current goals can now be found in the care plan section) Acute Rehab PT Goals Patient Stated Goal: To get her doctor to explain her expectations for her meds and follow up from here. Progress towards PT goals: Progressing toward goals    Frequency  Min 3X/week      PT Plan Discharge plan needs to be updated    Co-evaluation              AM-PAC PT "6 Clicks" Daily Activity  Outcome Measure  Difficulty turning over in bed (including adjusting bedclothes, sheets and blankets)?: None Difficulty moving from lying on back to sitting on the side of the bed? : None Difficulty sitting down on and standing up from a chair with arms (e.g., wheelchair, bedside commode, etc,.)?: None Help needed moving to and from a bed to chair (including a wheelchair)?: A Little Help needed walking in hospital room?: A Little Help needed climbing 3-5 steps with a railing? : A Little 6 Click Score: 21    End of Session Equipment Utilized During Treatment: Gait belt Activity Tolerance: Patient tolerated treatment well Patient  left: in chair;with call bell/phone within reach Nurse Communication: Mobility status PT Visit Diagnosis: Difficulty in walking, not elsewhere classified (R26.2);Unsteadiness on feet (R26.81)     Time: 3817-7116 PT Time Calculation (min) (ACUTE ONLY): 29 min  Charges:  $Gait Training: 8-22 mins $Therapeutic Exercise: 8-22 mins                    G Codes:  Functional Assessment Tool Used: AM-PAC 6 Clicks Basic Mobility;Clinical judgement     Ramond Dial 12/08/2016, 1:19 PM   Mee Hives, PT MS Acute Rehab Dept. Number: [place] [facility] [place]

## 2016-12-08 NOTE — Progress Notes (Addendum)
Pt requesting to start her home med of vit. B12.  Notified Dr. Candiss Norse and also mentioned if pt IVF can be d/c'd as pt still ivf at 44ml as told by nite shift nurse pt has it going and day shift MD to decide if to be continue.  Dr. Candiss Norse to put order in to d/c iff and to order vitB12 as per had ben taking it.  Verified with pt what dose is, pt stated vitamin B 12 1029mcg po QD.  Karie Kirks, Therapist, sports.

## 2016-12-08 NOTE — Consult Note (Addendum)
ELECTROPHYSIOLOGY CONSULT NOTE    Patient ID: Katherine Cardenas MRN: [id number], DOB/AGE: 12-Dec-1940 76 y.o.  Admit date: 12/06/2016 Date of Consult: 12/08/2016   Primary Physician: Mathews Argyle, MD Primary Cardiologist: Dr. Acie Fredrickson  Reason for Consultation: syncope, bradycardia/pauses  HPI: Katherine Cardenas is a 76 y.o. female who is being seen today for the evaluation of bradycardia/pauses, syncope at the request of Dr. Johnsie Cancel  PMHx is noted for HTN, ATach/AFib, Anxiety/depression.  There is mention of CAD, though had a LHC in 2016 (for back pain and abnormal Trop) with normal coronaries, Dr. Acie Fredrickson has mentioned perhaps had a small branch occlusion to account for the Trop and EKG changes and rx nitrate.  She was admitted to [facility] 12/06/16 with unusual fatigue/malaise and later in the day a syncopal event.  She remarked that she had been doing some yard work earlier in the day then out and walking with her husband and found that these activities made her feel unusually tired, to the point that she took a nap, this being unlike her.  After waking she continued to feel unusually tired, developed a severe back pain (that she has had before), decided to try a s/l NTG, without relief went to meditate and while in the other room felt a tingling sensation all over and called her husband who witnessed the syncopal event.  Today at the bedside her reports perhaps 45 seconds in duration, she woke clear.  Did not note any particular pallor or other observations.  The patient states that while in the ambulance and ER and movement made her very sick to her stomach and vomited at least once with this motion sisckness type feeling.  She was observed to have bradycardia 50's, and cardiology consulted yesterday, initially thoughts were perhaps vagal 2/2 back pain + s/l NTG, though later in the dat developed pauses and her propafenone was stopped, EP asked to evaluate.  LABS: K+ 4.1 BUN/Creat 13/0.99 poc  Trop 0.00 Trop I: <0.03 x2 WBC 9.1 H.H 13/41 plts 204   Past Medical History:  Diagnosis Date  . Anxiety   . Atrial fibrillation ([place])   . Atrial tachycardia [facility])    --sees cardiologist--Dr. Adrian Prows  . Depression   . Elevated troponin I level   . Facial spasm   . Heart murmur    functional-normal echocardiogram  . Hypertension      Surgical History:  Past Surgical History:  Procedure Laterality Date  . APPENDECTOMY     age 54  . CARDIAC CATHETERIZATION N/A 12/15/2014   Procedure: Left Heart Cath and Coronary Angiography;  Surgeon: Burnell Blanks, MD;  Location: [facility];  Service: Cardiovascular;  Laterality: N/A;  . CATARACT EXTRACTION     Right Eye  . DILATION AND CURETTAGE OF UTERUS  1990's   benign polyp  . TONSILLECTOMY AND ADENOIDECTOMY     --age 32     Prescriptions Prior to Admission  Medication Sig Dispense Refill Last Dose  . amLODipine (NORVASC) 2.5 MG tablet Take 2.5 mg by mouth at bedtime.  6 12/05/2016 at pm  . Ascorbic Acid (VITAMIN C) 1000 MG tablet Take 1,000 mg by mouth daily before supper.    12/06/2016 at pm  . aspirin 325 MG tablet Take 325 mg by mouth daily.   12/06/2016 at am  . cholecalciferol (VITAMIN D) 1000 UNITS tablet Take 1,000 Units by mouth at bedtime.    12/05/2016 at pm  . diltiazem (CARDIZEM) 30 MG tablet Take  30 mg by mouth daily as needed (for heart rate >100).    not yet taken  . LORazepam (ATIVAN) 1 MG tablet Take 1 mg by mouth daily.    12/06/2016 at am  . Magnesium 400 MG CAPS Take 400 mg by mouth daily.   12/06/2016 at am  . nitroGLYCERIN (NITROSTAT) 0.4 MG SL tablet Place 0.4 mg under the tongue every 5 (five) minutes x 3 doses as needed for chest pain.   12/06/2016 at 2100  . Omega-3 1400 MG CAPS Take 1,400 mg by mouth daily.   12/05/2016 at Unknown time  . Polyethyl Glycol-Propyl Glycol (SYSTANE OP) Place 1 drop into both eyes 2 (two) times daily.   12/06/2016 at am  . propafenone (RYTHMOL) 150 MG tablet Take  150 mg by mouth every 8 (eight) hours.    12/06/2016 at 1600  . RESVERATROL-GRAPE PO Take 1 tablet by mouth at bedtime.   12/05/2016 at pm  . Turmeric 500 MG CAPS Take 500 mg by mouth at bedtime.   12/05/2016 at pm  . methocarbamol (ROBAXIN) 500 MG tablet Take 1 tablet (500 mg total) by mouth every 8 (eight) hours as needed. (Patient not taking: Reported on 12/06/2016) 30 tablet 0 Not Taking at Unknown time  . rosuvastatin (CRESTOR) 5 MG tablet Take 1 tablet (5 mg total) by mouth 2 (two) times a week. (Patient not taking: Reported on 09/25/2016) 15 tablet 11 Not Taking at Unknown time    Inpatient Medications:  . amLODipine  2.5 mg Oral QHS  . aspirin  325 mg Oral Daily  . cholecalciferol  1,000 Units Oral QHS  . enoxaparin (LOVENOX) injection  40 mg Subcutaneous Daily  . LORazepam  1 mg Oral Daily  . magnesium oxide  400 mg Oral Daily  . omega-3 acid ethyl esters  1,000 mg Oral Daily  . polyvinyl alcohol   Both Eyes BID  . sodium chloride flush  3 mL Intravenous Q12H  . vitamin B-12  1,000 mcg Oral Daily    Allergies:  Allergies  Allergen Reactions  . Augmentin [Amoxicillin-Pot Clavulanate] Hives  . Ciprofloxacin Palpitations and Other (See Comments)    "irregular heart beat"  . Diltiazem Other (See Comments)    Pulse dropped too low  . Epinephrine Other (See Comments)    "NOVOCAIN"--could be the epinephrine----patient didn't tolerate-SYNCOPE    . Novocain [Procaine] Other (See Comments)    could be the epinephrine----patient didn't tolerate-SYNCOPE  . Avapro [Irbesartan] Swelling  . Shrimp [Shellfish Allergy] Hives  . Iohexol Itching and Rash     Code: RASH, Desc: patient states hx of possible reaction to ivp contrast years ago   . Penicillins Hives    Has patient had a PCN reaction causing immediate rash, facial/tongue/throat swelling, SOB or lightheadedness with hypotension: Yes Has patient had a PCN reaction causing severe rash involving mucus membranes or skin necrosis:  No Has patient had a PCN reaction that required hospitalization No Has patient had a PCN reaction occurring within the last 10 years: No If all of the above answers are "NO", then may proceed with Cephalosporin use.  [name] Zithromax [Azithromycin] Other (See Comments)    Cannot take with propafenone    Social History   Social History  . Marital status: Married    Spouse name: N/A  . Number of children: N/A  . Years of education: N/A   Occupational History  . Not on file.   Social History Main Topics  . Smoking status: Never  Smoker  . Smokeless tobacco: Never Used  . Alcohol use 8.4 oz/week    14 Glasses of wine per week  . Drug use: No  . Sexual activity: No   Other Topics Concern  . Not on file   Social History Narrative  . No narrative on file     Family History  Problem Relation Age of Onset  . Cancer Father 31    dec-stomach ca  . Hypertension Mother   . Renal Disease Mother 56    dec-renal failure  . Hypertension Maternal Grandmother   . Heart attack Maternal Grandmother      Review of Systems: All other systems reviewed and are otherwise negative except as noted above.  Physical Exam: Vitals:   12/07/16 1155 12/07/16 2123 12/08/16 0007 12/08/16 0319  BP: 107/62 (!) 156/79 136/68 (!) 155/85  Pulse: (!) 55 64 (!) 57 62  Resp: 20 18 18 18   Temp: 98.3 F (36.8 C) 98 F (36.7 C) 98.3 F (36.8 C) 98.2 F (36.8 C)  TempSrc: Oral Oral Oral Oral  SpO2: 98% 99% 96% 97%  Weight:    161 lb 1.6 oz (73.1 kg)  Height:        GEN- The patient is well appearing, alert and oriented x 3 today.   HEENT: normocephalic, atraumatic; sclera clear, conjunctiva pink; hearing intact; oropharynx clear; neck supple, no JVP Lymph- no cervical lymphadenopathy Lungs- CTA b/l, normal work of breathing.  No wheezes, rales, rhonchi Heart- RRR, no murmurs, rubs or gallops, PMI not laterally displaced GI- soft, non-tender, non-distended Extremities- no clubbing, cyanosis, or  edema MS- no significant deformity or atrophy Skin- warm and dry, no rash or lesion Psych- euthymic mood, full affect Neuro- no gross deficits observed  Labs:   Lab Results  Component Value Date   WBC 9.1 12/06/2016   HGB 13.7 12/06/2016   HCT 41.9 12/06/2016   MCV 93.3 12/06/2016   PLT 204 12/06/2016    Recent Labs Lab 12/06/16 2316  NA 137  K 4.1  CL 103  CO2 25  BUN 13  CREATININE 0.99  CALCIUM 9.1  PROT 6.0*  BILITOT 0.6  ALKPHOS 47  ALT 16  AST 27  GLUCOSE 143*      Radiology/Studies:  Dg Chest 2 View Result Date: 12/06/2016 CLINICAL DATA:  Syncope. History of hypertension and atrial fibrillation. EXAM: CHEST  2 VIEW COMPARISON:  Chest radiograph September 25, 2016 FINDINGS: Cardiac silhouette is mildly enlarged and unchanged. Mediastinal silhouette is nonsuspicious. No pleural effusion or focal consolidation. Strandy densities LEFT lung base. No pneumothorax. Soft tissue planes and included osseous structures are nonsuspicious. Osteopenia. IMPRESSION: Stable cardiomegaly and LEFT lung base atelectasis. Electronically Signed   By: Elon Alas M.D.   On: 12/06/2016 23:29   Ct Head Wo Contrast Result Date: 12/07/2016 CLINICAL DATA:  Witnessed syncopal episode at table tonight. No head injury. Feeling unwell. History of atrial fibrillation, hypertension. EXAM: CT HEAD WITHOUT CONTRAST TECHNIQUE: Contiguous axial images were obtained from the base of the skull through the vertex without intravenous contrast. COMPARISON:  MRI of the head March 04, 2013 FINDINGS: BRAIN: No intraparenchymal hemorrhage, mass effect nor midline shift. The ventricles and sulci are normal for age. Patchy supratentorial white matter hypodensities less than expected for patient's age, though non-specific are most compatible with chronic small vessel ischemic disease. No acute large vascular territory infarcts. No abnormal extra-axial fluid collections. Basal cisterns are patent. VASCULAR: Mild  calcific atherosclerosis of the carotid siphons. SKULL:  No skull fracture. No significant scalp soft tissue swelling. SINUSES/ORBITS: Paranasal sinus mucosal thickening with small LEFT maxillary sinus air-fluid level. Mastoid air cells are well aerated. The included ocular globes and orbital contents are non-suspicious. Status post RIGHT ocular lens implant. OTHER: None. IMPRESSION: Negative noncontrast CT HEAD for age. Electronically Signed   By: Elon Alas M.D.   On: 12/07/2016 00:56   Reviewed by myself: EKG: SB, 56bpm, PR 243ms, QRS 138ms QTc 421ms TELEMETRY: SB/SR generally 50's60's, yesterday noted for hours at a time junctional rhythm, generally 50's, some 40's and 60's, with occ sinus pauses longest 2.3 seonds, today so far has maintained SR without significant bradycardia  12/07/16: TTE Study Conclusions - Left ventricle: The cavity size was normal. Wall thickness was   increased in a pattern of mild LVH. Systolic function was normal.   The estimated ejection fraction was in the range of 60% to 65%.   Wall motion was normal; there were no regional wall motion   abnormalities. The study is not technically sufficient to allow   evaluation of LV diastolic function. - Aortic valve: There was trivial regurgitation. - Mitral valve: There was mild regurgitation. - Atrial septum: No defect or patent foramen ovale was identified. - Tricuspid valve: There was mild-moderate regurgitation. - Pulmonary arteries: PA peak pressure: 32 mm Hg (S).   Assessment and Plan:   1. Syncope     Uncertain mechanism, with junctional rhythm/pauses here she was asymptomatic      Bradycardia could have been cause/contributed      2. Sinus node dysfunction      Agree with holding Propafenone      approx 2 years her daily diltiazem was stopped 2/2 bradycardia      Known Hx of PAT/AFib, follows with Dr. Ola Spurr (EP) [facility] is 3, not on a/c, the patient reports it has been many years  since she has had an episode of her arrhythmia  1/2 life of propafenone can be as long as 32 hours, would follow at least another 24 hours off prior to discharging patient, Dr. Caryl Comes will d/w Dr. Ola Spurr further who knows her well.  3. HTN     follow    Signed, Tommye Standard, PA-C 12/08/2016 10:02 AM   Patient seen interviewed and examined. Old records demonstrated 14 year history of propafenone. About 4 years ago she developed sinus node dysfunction with heart rates in the 40s prompting the discontinuation of long-acting diltiazem. Recently she has noted increasing fatigue. Vital signs at home have demonstrated heart rates drifting from the 60s now more recently into the 50s. She is not aware heart rates faster than the 60s.  She's had a history of recurrent back pain. There is been some question as to its cause. Some have felt that it's an ischemic manifestation not withstanding a Angiogram demonstrated no obstructive disease, prompting a prescription for nitroglycerin which was used about 10-15 minutes prior to her syncopal episode. Others have been unsure that it's related to ischemia.  The day of admission she had severe back pain. She took the nitroglycerin. 5-10 minutes later she lost consciousness. Her husband who is a retired physician was unable to give me very explicit details as to what happened although he thinks the duration was less than 1 minute. He also thinks that upon awakening her cognition was quite intact. She was quite nauseated however en route and had significant nausea and vomiting with head motion following arrival in  the emergency room. EMS vital signs were reviewed. Blood pressure was normal heart rate was in the 40s.  Electrocardiogram is insignificantly changed from 2004. Echocardiogram demonstrates normal LV function. Telemetry personally reviewed demonstrates repeated episodes of sinus arrest with junctional escapes with heart rates in the 40s and 30s. There is  less over the last 24 hours.  Impression 1. Atrial arrhythmias including tachycardia 2. sinus node dysfunction with previous discontinuation of long-acting calcium blockers 3. Hypertension 4. Back pain question cause 5. Anxiety 6. Syncope    Patient has had significant bradycardia. It was appropriate to discontinue her propafenone. This had been a long-standing prescribed medication under the care of Dr. Adrian Prows formally at [facility] in [facility]. I will try and be in touch with him as to alternative therapies. In the interim however, if she does not have significant bradycardia over the next 24-48 hours (see below) it would be reasonable to discharge her with an outpatient event recorder.  The challenging duration of inpatient monitoring is related to the variable metabolism of propafenone. About 7% of Caucasian for flow metabolize or; I suspect that she has a normal metabolizer given her long-standing use of the drug. Hence, it would probably be reasonable if no significant bradycardia is seen overnight for her to go home tomorrow with the monitor.  I wonder whether her syncope was arrhythmic given the brevity and the clarity of her thinking as reported by her husband.  Not withstanding, her heart rates are improved with the discontinuation of the propafenone. There may be neurally mediated component and I suggested that she NO LONGER take nitroglycerin  I reviewed this extensively with her husband and her granddaughter

## 2016-12-09 ENCOUNTER — Ambulatory Visit: Payer: Medicare Other | Admitting: Cardiovascular Disease

## 2016-12-09 ENCOUNTER — Other Ambulatory Visit: Payer: Self-pay | Admitting: Physician Assistant

## 2016-12-09 DIAGNOSIS — R55 Syncope and collapse: Secondary | ICD-10-CM

## 2016-12-09 DIAGNOSIS — R001 Bradycardia, unspecified: Secondary | ICD-10-CM

## 2016-12-09 MED ORDER — AMLODIPINE BESYLATE 5 MG PO TABS: 5.0000 mg | ORAL_TABLET | Freq: Every day | ORAL | 0 refills | Status: DC

## 2016-12-09 NOTE — Progress Notes (Signed)
All d/c instructions explained and given to pt and husband.  Verbalized understanding..  D/c off floor at 1320.  Karie Kirks, Therapist, sports.

## 2016-12-09 NOTE — Progress Notes (Signed)
CM talked to patient about Outpatient PT; she does not want this therapy at this time; CM informed patient that her PCP Dr Felipa Eth can make the arrangements for Outpatient PT from his office.Pt spouse, Ret Neurologist to pick up patient for discharge home today; Aneta Mins [phone]

## 2016-12-09 NOTE — Progress Notes (Signed)
The patient was made aware that Dr. Antony Odea office would be contacting her to arrange out patient monitoring and follow up with him, she was also told and made aware of [place] law no driving for 6 months.  This was very upsetting to her given the impact on her life a driving restriction will be.  We discussed at length and in effort to try to find alternatives such as transportation services/case management evaluation, or other options such as [facility] Popper or similar options since she does not want to have to lean of family/friends.  She would like to discuss further with her PMD for his opinion, and I was able to get her an appointment for tomorrow.  At the end of our visit, she was feeling better and felt like at least in the near future could be manageable until she could follow up with her PMD and/or Dr. Ola Spurr.  Tommye Standard, PA-C

## 2016-12-09 NOTE — Progress Notes (Signed)
Patient Name: Katherine Cardenas      SUBJECTIVE:*without complaint of SOB or chest pain   Past Medical History:  Diagnosis Date  . Anxiety   . Atrial fibrillation ([place])   . Atrial tachycardia [facility])    --sees cardiologist--Dr. Adrian Prows  . Depression   . Elevated troponin I level   . Facial spasm   . Heart murmur    functional-normal echocardiogram  . Hypertension     Scheduled Meds:  Scheduled Meds: . amLODipine  2.5 mg Oral QHS  . aspirin  325 mg Oral Daily  . cholecalciferol  1,000 Units Oral QHS  . enoxaparin (LOVENOX) injection  40 mg Subcutaneous Daily  . LORazepam  1 mg Oral Daily  . magnesium oxide  400 mg Oral Daily  . omega-3 acid ethyl esters  1,000 mg Oral Daily  . polyvinyl alcohol   Both Eyes BID  . sodium chloride flush  3 mL Intravenous Q12H  . vitamin B-12  1,000 mcg Oral Daily   Continuous Infusions: acetaminophen **OR** [DISCONTINUED] acetaminophen, alum & mag hydroxide-simeth, HYDROcodone-acetaminophen, [DISCONTINUED] ondansetron **OR** ondansetron (ZOFRAN) IV, polyethylene glycol    PHYSICAL EXAM Vitals:   12/08/16 0920 12/08/16 1241 12/08/16 2144 12/09/16 0630  BP: (!) 150/81 (!) 157/78 135/85 (!) 154/74  Pulse: 70 61 65 (!) 57  Resp:  18 16 17   Temp:  98.1 F (36.7 C) 97.6 F (36.4 C) 99.3 F (37.4 C)  TempSrc:  Oral Oral Oral  SpO2:  96% 98% 98%  Weight:    157 lb 11.2 oz (71.5 kg)  Height:        Well developed and nourished in no acute distress HENT normal Neck supple with JVP-flat Carotids brisk and full without bruits Clear Regular rate and rhythm, no murmurs or gallops Abd-soft with active BS without hepatomegaly No Clubbing cyanosis edema Skin-warm and dry A & Oriented  Grossly normal sensory and motor function       Intake/Output Summary (Last 24 hours) at 12/09/16 0855 Last data filed at 12/09/16 0500  Gross per 24 hour  Intake              700 ml  Output             1500 ml  Net              -800 ml    LABS: Basic Metabolic Panel:  Recent Labs Lab 12/06/16 2316  NA 137  K 4.1  CL 103  CO2 25  GLUCOSE 143*  BUN 13  CREATININE 0.99  CALCIUM 9.1   Cardiac Enzymes:  Recent Labs  12/07/16 0258 12/07/16 1200  TROPONINI <0.03 <0.03   CBC:  Recent Labs Lab 12/06/16 2316  WBC 9.1  NEUTROABS 5.8  HGB 13.7  HCT 41.9  MCV 93.3  PLT 204   PROTIME: No results for input(s): LABPROT, INR in the last 72 hours. Liver Function Tests:  Recent Labs  12/06/16 2316  AST 27  ALT 16  ALKPHOS 47  BILITOT 0.6  PROT 6.0*  ALBUMIN 3.7    Recent Labs  12/06/16 2311  LIPASE 24   BNP: BNP (last 3 results) No results for input(s): BNP in the last 8760 hours.  ProBNP (last 3 results) No results for input(s): PROBNP in the last 8760 hours.     Telemetry Personally reviewed  No interval sinus pauses   ASSESSMENT AND PLAN:  Principal Problem:   Syncope Active Problems:  CAD (coronary artery disease)   Hyperlipidemia   Sinus bradycardia on ECG   AF (paroxysmal atrial fibrillation) (HCC) HTN   No interval sinus bradycardia so I think she is able to go home;  She is almost certainly a rapid metabolizer of propafenone I will be in touch w her Primary EP and clarify next steps with him   Would use monitor which we can arrange ( or possibly he)   For BP  Would have her increase her amlodipine if necessary although she has had some problems with edema already  Tried to clarify plan for recurrent arrhtymia  She is quite anxious  But have tried to reassure of the benign nature of the atrial tachycardia  Spoke with Dr Lorene Dy he will take care of monitor     Signed, Virl Axe MD  12/09/2016

## 2016-12-09 NOTE — Progress Notes (Signed)
Pt slept well during the night, Ambulate in a hallway. Vitals stable, no any sign of SOB and distress noted, no any complain of pain, will continue to monitor the patient.

## 2016-12-09 NOTE — Discharge Instructions (Signed)
Follow with Primary MD Mathews Argyle, MD and your cardiologist Dr. Ola Spurr within 1 week    Get CBC, CMP, 2 view Chest X ray checked  by Primary MD or SNF MD in 5-7 days ( we routinely change or add medications that can affect your baseline labs and fluid status, therefore we recommend that you get the mentioned basic workup next visit with your PCP, your PCP may decide not to get them or add new tests based on their clinical decision)  Activity: As tolerated with Full fall precautions use walker/cane & assistance as needed  Disposition Home   Diet:  Heart Healthy    For Heart failure patients - Check your Weight same time everyday, if you gain over 2 pounds, or you develop in leg swelling, experience more shortness of breath or chest pain, call your Primary MD immediately. Follow Cardiac Low Salt Diet and 1.5 lit/day fluid restriction.  On your next visit with your primary care physician please Get Medicines reviewed and adjusted.  Please request your Prim.MD to go over all Hospital Tests and Procedure/Radiological results at the follow up, please get all Hospital records sent to your Prim MD by signing hospital release before you go home.  If you experience worsening of your admission symptoms, develop shortness of breath, life threatening emergency, suicidal or homicidal thoughts you must seek medical attention immediately by calling 911 or calling your MD immediately  if symptoms less severe.  You Must read complete instructions/literature along with all the possible adverse reactions/side effects for all the Medicines you take and that have been prescribed to you. Take any new Medicines after you have completely understood and accpet all the possible adverse reactions/side effects.   Do not drive, operate heavy machinery, perform activities at heights, swimming or participation in water activities or provide baby sitting services if your were admitted for syncope or siezures until  you have seen by Primary MD or a Neurologist and advised to do so again.  Do not drive when taking Pain medications.    Do not take more than prescribed Pain, Sleep and Anxiety Medications  Special Instructions: If you have smoked or chewed Tobacco  in the last 2 yrs please stop smoking, stop any regular Alcohol  and or any Recreational drug use.  Wear Seat belts while driving.   Please note  You were cared for by a hospitalist during your hospital stay. If you have any questions about your discharge medications or the care you received while you were in the hospital after you are discharged, you can call the unit and asked to speak with the hospitalist on call if the hospitalist that took care of you is not available. Once you are discharged, your primary care physician will handle any further medical issues. Please note that NO REFILLS for any discharge medications will be authorized once you are discharged, as it is imperative that you return to your primary care physician (or establish a relationship with a primary care physician if you do not have one) for your aftercare needs so that they can reassess your need for medications and monitor your lab values.   No driving for 6 months or until cleared to by your follow up physicians

## 2016-12-10 ENCOUNTER — Ambulatory Visit
Admission: RE | Admit: 2016-12-10 | Discharge: 2016-12-10 | Disposition: A | Discharge status: Home / Self Care | Payer: Medicare Other | Source: Ambulatory Visit | Attending: Geriatric Medicine | Admitting: Geriatric Medicine

## 2016-12-10 ENCOUNTER — Other Ambulatory Visit: Payer: Self-pay | Admitting: Geriatric Medicine

## 2016-12-10 DIAGNOSIS — R918 Other nonspecific abnormal finding of lung field: Secondary | ICD-10-CM | POA: Diagnosis not present

## 2016-12-10 DIAGNOSIS — M546 Pain in thoracic spine: Secondary | ICD-10-CM | POA: Diagnosis not present

## 2016-12-10 DIAGNOSIS — J9811 Atelectasis: Secondary | ICD-10-CM

## 2016-12-10 DIAGNOSIS — I4891 Unspecified atrial fibrillation: Secondary | ICD-10-CM | POA: Diagnosis not present

## 2016-12-10 DIAGNOSIS — Z79899 Other long term (current) drug therapy: Secondary | ICD-10-CM | POA: Diagnosis not present

## 2016-12-10 DIAGNOSIS — I10 Essential (primary) hypertension: Secondary | ICD-10-CM | POA: Diagnosis not present

## 2016-12-10 DIAGNOSIS — R55 Syncope and collapse: Secondary | ICD-10-CM | POA: Diagnosis not present

## 2016-12-10 DIAGNOSIS — G8929 Other chronic pain: Secondary | ICD-10-CM | POA: Diagnosis not present

## 2016-12-16 ENCOUNTER — Ambulatory Visit (INDEPENDENT_AMBULATORY_CARE_PROVIDER_SITE_OTHER): Payer: Medicare Other

## 2016-12-16 DIAGNOSIS — R001 Bradycardia, unspecified: Secondary | ICD-10-CM

## 2016-12-16 DIAGNOSIS — R55 Syncope and collapse: Secondary | ICD-10-CM | POA: Diagnosis not present

## 2016-12-22 ENCOUNTER — Encounter: Payer: Self-pay | Admitting: Physician Assistant

## 2016-12-22 ENCOUNTER — Ambulatory Visit (INDEPENDENT_AMBULATORY_CARE_PROVIDER_SITE_OTHER): Payer: Medicare Other | Admitting: Physician Assistant

## 2016-12-22 VITALS — BP 124/62 | HR 65 | Ht 66.0 in | Wt 157.0 lb

## 2016-12-22 DIAGNOSIS — I2510 Atherosclerotic heart disease of native coronary artery without angina pectoris: Secondary | ICD-10-CM | POA: Diagnosis not present

## 2016-12-22 DIAGNOSIS — I480 Paroxysmal atrial fibrillation: Secondary | ICD-10-CM

## 2016-12-22 DIAGNOSIS — R55 Syncope and collapse: Secondary | ICD-10-CM | POA: Diagnosis not present

## 2016-12-22 NOTE — Progress Notes (Signed)
Cardiology Office Note:    Date:  12/22/2016   ID:  [name], DOB August 14, 1940, MRN [id number]  PCP:  Lajean Manes, MD  Cardiologist:  Dr. Liam Rogers   Electrophysiologist: Dr. Ola Spurr at [facility]  Referring MD: Lajean Manes, MD   Chief Complaint  Patient presents with  . Hospitalization Follow-up    syncope    History of Present Illness:    Katherine Cardenas is a 76 y.o. female with a hx of focal atrial tachycardia, rare episodes of atrial fibrillation (treated initially with diltiazem and later propafenone), mild nonobstructive CAD catheterization in 2016.  Last seen by Dr. Liam Rogers in 6/17.    Admitted 4/28-5/1 with syncope.  Of note, she took NTG for chest/back pain prior to her syncopal episode.  In the hospital, she was noted to be bradycardic with sinus pauses and junctional rhythm on Tele.  Propafenone was DC'd.  She was evaluated by Dr. Virl Axe who conferred with her Electrophysiologist, Dr. Ola Spurr.  An OP event monitor is to be arranged with Dr. Blane Ohara office.  She was told to not drive for 6 mos.  Echo did show normal LVF.   She returns for post hospitalization follow up.  She is here alone.  She has been under a great amount of stress since she was told she cannot drive for 6 mos.  She continues to have episodes of interscapular back pain. She denies exertional symptoms. She denies further syncope.  She has a hx of significant varicose veins.    Prior CV studies:   The following studies were reviewed today:  Echo 12/07/16 Mild LVH, EF 60-65, normal wall motion, trivial AI, mild MR, PASP 32, mild to moderate TR  LHC 12/15/14 LAD ostial 20, proximal 20 RCA proximal 20 EF >65 Mild nonobstructive CAD, normal LV systolic function  Past Medical History:  Diagnosis Date  . Anxiety   . Atrial fibrillation ([place])   . Atrial tachycardia [facility])    --sees cardiologist--Dr. Adrian Prows  . Depression   . Elevated troponin I level   .  Facial spasm   . Heart murmur    functional-normal echocardiogram  . Hypertension     Past Surgical History:  Procedure Laterality Date  . APPENDECTOMY     age 67  . CARDIAC CATHETERIZATION N/A 12/15/2014   Procedure: Left Heart Cath and Coronary Angiography;  Surgeon: Burnell Blanks, MD;  Location: [place] [facility];  Service: Cardiovascular;  Laterality: N/A;  . CATARACT EXTRACTION     Right Eye  . DILATION AND CURETTAGE OF UTERUS  1990's   benign polyp  . TONSILLECTOMY AND ADENOIDECTOMY     --age 19    Current Medications: Current Meds  Medication Sig  . amLODipine (NORVASC) 2.5 MG tablet Take 2.5 mg by mouth daily.  . Ascorbic Acid (VITAMIN C) 1000 MG tablet Take 1,000 mg by mouth daily before supper.   [name] aspirin 325 MG tablet Take 325 mg by mouth daily.  . cholecalciferol (VITAMIN D) 1000 UNITS tablet Take 1,000 Units by mouth at bedtime.   [name] diltiazem (CARDIZEM) 30 MG tablet Take 30 mg by mouth daily as needed (for heart rate >100).   . LORazepam (ATIVAN) 1 MG tablet Take 1 mg by mouth daily.   . Magnesium 400 MG CAPS Take 400 mg by mouth daily.  . nitroGLYCERIN (NITROSTAT) 0.4 MG SL tablet Place 0.4 mg under the tongue every 5 (five) minutes x 3 doses as  needed for chest pain.  . Omega-3 1400 MG CAPS Take 1,400 mg by mouth daily.  Vladimir Faster Glycol-Propyl Glycol (SYSTANE OP) Place 1 drop into both eyes 2 (two) times daily.  [name] RESVERATROL-GRAPE PO Take 1 tablet by mouth at bedtime.  . Turmeric 500 MG CAPS Take 500 mg by mouth at bedtime.     Allergies:   Augmentin [amoxicillin-pot clavulanate]; Ciprofloxacin; Diltiazem; Epinephrine; Novocain [procaine]; Avapro [irbesartan]; Shrimp [shellfish allergy]; Iohexol; Penicillins; and Zithromax [azithromycin]   Social History   Social History  . Marital status: Married    Spouse name: N/A  . Number of children: N/A  . Years of education: N/A   Social History Main Topics  . Smoking status: Never Smoker  . Smokeless  tobacco: Never Used  . Alcohol use 8.4 oz/week    14 Glasses of wine per week  . Drug use: No  . Sexual activity: No   Other Topics Concern  . Not on file   Social History Narrative  . No narrative on file     Family Hx: The patient's family history includes Cancer (age of onset: 84) in her father; Heart attack in her maternal grandmother; Hypertension in her maternal grandmother and mother; Renal Disease (age of onset: 44) in her mother.  ROS:   Please see the history of present illness.    ROS All other systems reviewed and are negative.   EKGs/Labs/Other Test Reviewed:    EKG:  EKG is  ordered today.  The ekg ordered today demonstrates NSR, HR 65, normal axis, nonspecific ST-T wave changes, QTc 418 ms, similar to prior tracing  Recent Labs: 12/06/2016: ALT 16; BUN 13; Creatinine, Ser 0.99; Hemoglobin 13.7; Platelets 204; Potassium 4.1; Sodium 137   Recent Lipid Panel    Component Value Date/Time   CHOL 194 01/18/2016 1101   TRIG 44 01/18/2016 1101   HDL 80 01/18/2016 1101   CHOLHDL 2.4 01/18/2016 1101   VLDL 9 01/18/2016 1101   LDLCALC 105 01/18/2016 1101     Physical Exam:    VS:  BP 124/62   Pulse 65   Ht 5\' 6"  (1.676 m)   Wt 157 lb (71.2 kg)   LMP 08/11/1988 (Approximate)   BMI 25.34 kg/m     Wt Readings from Last 3 Encounters:  12/22/16 157 lb (71.2 kg)  12/09/16 157 lb 11.2 oz (71.5 kg)  09/25/16 155 lb (70.3 kg)     Physical Exam  Constitutional: She is oriented to person, place, and time. She appears well-developed and well-nourished. No distress.  HENT:  Head: Normocephalic and atraumatic.  Eyes: No scleral icterus.  Neck: Normal range of motion. No JVD present.  Cardiovascular: Normal rate, regular rhythm, S1 normal, S2 normal and normal heart sounds.   No murmur heard. Pulmonary/Chest: Breath sounds normal. She has no wheezes. She has no rhonchi. She has no rales.  Abdominal: Soft. There is no tenderness.  Musculoskeletal: She exhibits no  edema.  Neurological: She is alert and oriented to person, place, and time.  Skin: Skin is warm and dry.  Psychiatric: She has a normal mood and affect.    ASSESSMENT:    1. Syncope, unspecified syncope type   2. AF (paroxysmal atrial fibrillation) ([place])   3. Coronary artery disease involving native coronary artery of native heart without angina pectoris    PLAN:    In order of problems listed above:  1. Syncope, unspecified syncope type -  As noted, her Propafenone was DCd 2/2  bradycardia.  She denies any recurrence.  Event monitor is pending.  She had a lot of questions today regarding next steps, why she cannot drive, what she should do in regards to follow up with EP and what she should do for rapid palpitations.  We had a prolonged discussion about these complex clinical issues and went over the various important aspects to consider. All questions were answered to the best of my ability.  She plans to follow up with Dr. Ola Spurr one more time and then transfer her care to Dr. Virl Axe here.  She would also like to follow up with Dr. Liam Rogers soon.    2. AF (paroxysmal atrial fibrillation) ([place]) - As noted, she had many questions about medication options and what would be the course of treatment if she has recurrent atrial fibrillation in the future.  I explained to her that some patients need a pacemaker to allow the use of drugs that contribute to bradycardia.  She asked about Flecainide and I discussed with her the need to remain on AV nodal blocking agents while on this drug.  I tried to answer all of her questions.  Of note, she is on ASA 325.  I have asked her to review this with Dr. Ola Spurr when she sees him next month.  If ok with him, I would recommend changing to ASA 81 mg QD.   3. Coronary artery disease involving native coronary artery of native heart without angina pectoris -  No angina.  Continue ASA, statin.   Dispo:  Return in about 2 months (around  02/21/2017) for Routine Follow Up, w/ Dr. Acie Fredrickson.   Total time spent with patient today 45 minutes. This includes reviewing records, evaluating the patient and coordinating care. Face-to-face time >50%.   Medication Adjustments/Labs and Tests Ordered: Current medicines are reviewed at length with the patient today.  Concerns regarding medicines are outlined above.  Orders/Tests:  Orders Placed This Encounter  Procedures  . EKG 12-Lead   Medication changes: No orders of the defined types were placed in this encounter.  Signed, Richardson Dopp, PA-C  12/22/2016 10:56 AM    [facility] [place], [place],   [address] Phone: [phone]; Fax: [phone]

## 2016-12-22 NOTE — Patient Instructions (Addendum)
Medication Instructions:  Ask Dr. Ola Spurr if you can decrease your Aspirin to 81 mg Once daily  Amlodipine--take 2.5 mg daily  Labwork: None   Testing/Procedures: None   Follow-Up: Dr. Liam Rogers in 2 months.  Any Other Special Instructions Will Be Listed Below (If Applicable). If Dr. Ola Spurr needs you to see EP sooner, call so we can arrange an appointment with Dr. Virl Axe   If you need a refill on your cardiac medications before your next appointment, please call your pharmacy.

## 2016-12-22 NOTE — Telephone Encounter (Signed)
This encounter was created in error - please disregard.

## 2017-01-06 ENCOUNTER — Telehealth: Payer: Self-pay

## 2017-01-06 NOTE — Telephone Encounter (Signed)
Received [employer] event report from 01/06/17 02:11 AM (EDT). Preliminary Findings: Pause 3 sec, SB, SR.   Received [employer] event report from 01/05/17 02:36 PM (EDT). Preliminary Findings: Atrial Fibrillation.  Called, spoke with pt. Pt informed she was asleep for event on 01/06/17.   Pt stated she was awake for event on 01/05/17. Pt stated she could tell her heart was increased. Pt stated not feeling that well. No other symptoms noted.   I asked if pt was having any symptoms (dizziness, SOB, CP) pt stated " I just don't feel good". Inquired about making appt with Dr. Caryl Comes. Pt stated she is not ready to make appt with Dr. Caryl Comes (pt has EP a doctor - Dr. Ola Spurr). Pt stated she wants to have 1 more visit with Dr. Ola Spurr before making decision to switch to Dr. Caryl Comes. Pt requested to switch upcoming appt frrom Richardson Dopp, PA to Dr. Acie Fredrickson. Will forward to MD to advise.

## 2017-01-06 NOTE — Telephone Encounter (Signed)
Quite a bit of time has been spent with this patient, attempting to provide appropriate care. The dialogue as outlined by nurse Marsa Aris is accurate and reflexes our attempts to help manage her problems. Hopefully she will follow-up as we have recommended.

## 2017-01-06 NOTE — Telephone Encounter (Signed)
Called, spoke with pt. Reviewed Dr. Thompson Caul recommendations - pt needs to be seen THIS WEEK - c/w Tachy-Brady. Needs EP ASAP - either Dr. Ola Spurr or Dr. Caryl Comes before this week over. Needs appt this week to discuss guidelines of anticoagulation.   Offered pt appt this week at our office - pt declined. Informed the recommendation is for pt to be seen in our office this week, by a doctor or extender. Pt stated she does not want to be seen by "just anybody". Pt stated she understands the concern and urgency with Dr. Thompson Caul recommendation, but she has decided to try to get in with Dr. Ola Spurr.  Pt stated she has been dealing with this for 15 years, she is well aware of concerns with anticoagulation. Pt states she takes Aspirin 325 mg daily. Pt informed she is not "thrilled" with anticoagulation medications. Pt stated she has spoken with Dr. Ola Spurr about this. Informed I will call Dr. Blane Ohara office to request appt asap, and I will fax over [place] monitor reports. Pt requesting her next appt with Richardson Dopp, PA be reschedueld with Dr. Acie Fredrickson. Will forward to Dr. Acie Fredrickson and nurse to advise.

## 2017-01-06 NOTE — Telephone Encounter (Signed)
Called, left message for Abigail Butts, Nurse at [facility]. Requested their office to set up appt with Dr. Ola Spurr ASAP - explained urgency - pt must get scheduled this week.. Requested call back to our office, to let us know when the pt has been contacted and scheduled.

## 2017-01-07 DIAGNOSIS — Z6825 Body mass index (BMI) 25.0-25.9, adult: Secondary | ICD-10-CM | POA: Diagnosis not present

## 2017-01-07 DIAGNOSIS — I480 Paroxysmal atrial fibrillation: Secondary | ICD-10-CM | POA: Diagnosis not present

## 2017-01-07 DIAGNOSIS — I10 Essential (primary) hypertension: Secondary | ICD-10-CM | POA: Diagnosis not present

## 2017-01-07 DIAGNOSIS — I2510 Atherosclerotic heart disease of native coronary artery without angina pectoris: Secondary | ICD-10-CM | POA: Diagnosis not present

## 2017-01-07 DIAGNOSIS — I471 Supraventricular tachycardia: Secondary | ICD-10-CM | POA: Diagnosis not present

## 2017-01-07 NOTE — Telephone Encounter (Signed)
Received incoming call from Abigail Butts, nurse at [facility]. Abigail Butts informed pt has appt with Dr. Ola Spurr this afternoon at the [facility] office. Abigail Butts verified she received the [employer] telemetry event reports.

## 2017-01-07 NOTE — Telephone Encounter (Signed)
Spoke with patient regarding recent phone calls and her request at last appointment with Richardson Dopp, PA on 5/14 to see Dr. Acie Fredrickson. Patient states she has an appointment with Dr. Ola Spurr, her EP today in [place]. I offered her an appointment with Dr. Acie Fredrickson next week. After discussing her concerns and her desire to move her EP doctor to [facility], she agreed to take the appointment with Dr. Acie Fredrickson for next week. She verbalized gratitude for the call and for the explanation of the increased concern from her monitor findings.

## 2017-01-13 ENCOUNTER — Encounter: Payer: Self-pay | Admitting: Cardiovascular Disease

## 2017-01-13 ENCOUNTER — Ambulatory Visit (INDEPENDENT_AMBULATORY_CARE_PROVIDER_SITE_OTHER): Payer: Medicare Other | Admitting: Cardiovascular Disease

## 2017-01-13 VITALS — BP 140/84 | HR 86 | Wt 156.5 lb

## 2017-01-13 DIAGNOSIS — I480 Paroxysmal atrial fibrillation: Secondary | ICD-10-CM

## 2017-01-13 DIAGNOSIS — I2510 Atherosclerotic heart disease of native coronary artery without angina pectoris: Secondary | ICD-10-CM

## 2017-01-13 DIAGNOSIS — I10 Essential (primary) hypertension: Secondary | ICD-10-CM | POA: Diagnosis not present

## 2017-01-13 NOTE — Progress Notes (Signed)
Cardiology Office Note   Date:  01/13/2017   ID:  MEKAILA TARNOW, DOB September 28, 1940, MRN [id number]  PCP:  Lajean Manes, MD  Cardiologist:   Mertie Moores, MD   Chief Complaint  Patient presents with  . Coronary Artery Disease  . Atrial Fibrillation   Problem List 1. Viral URI 2. NSTEMI  3. Atrial fib - followed by Dr. Ola Spurr  4, Essential HTN    History of Present Illness: Katherine Cardenas is a 76 y.o. female who presents for follow up of an episode of CP . She work up during the night on Tuesday -  Upper back . Fairly severe .  Across her back .  Tried a heating pad. Lasted for 2-3 hours. Went back to sleep . Woke up in the am without any back pain . Saw Dr. Carlyle Lipa PA the next am.   Martin Majestic to the ER.  Serial troponin levels were constat, did not increase  ECG showed NS changes.   Troponin level = 0.08. Has some jaw, teeth and upper jaw pain on occasion .  Thinks it's TMJ disease  Very active , works in the garden.  Does Tai Chi.  No further episodes of pain - its been raining so no yard work .    January 12, 2015:   She had a cardiac cath that showed mild CAD.  Her LV function was normal but there was a subtle inferior wall motion defect - she may have had coronary spasm vs. Blockage of a tiny branch.   Its also possible that she had a viral cardiomyopathy with subsequent tirvial Troponin increase and a subtle wall motion abn.   January 18, 2016:; [name] is seen back today for follow up visit. Has some back pain / pressure.   The pain has resolved .   Yesterday , she felt very poorly .  Had lots of fatigue and had trouble getting anything done .  Has had some depression and anxiety in the past.  Feels better today  Had some angioedema with the Irbastartan  - was switched to amlodipine .  BP log  looks good   January 13, 2017:  [name] is seen back today for follow up for an episode of syncope.    Was admitted to the hospital in late April . The episode of syncope appeared  to start after she took a SL NTG for back pain ( we have thought that this was an angina equivalent )     She was seen by Richardson Dopp May 14,  She was found to have paroxysmal  atrial fib  She was seen by her electrophysiologist  - Dr. Ola Spurr several days ago. She plans on transferring her care to Dr. Jolyn Nap.  CHADS2VASC score of 3 ( age 30, HTN) .  She has had episodes of syncope in the past and Dr. Blane Ohara opinion is that she will need to have a pacemaker placed so that we can initiate rate stabilizing medications.     Past Medical History:  Diagnosis Date  . Anxiety   . Atrial fibrillation ([place])   . Atrial tachycardia [facility])    --sees cardiologist--Dr. Adrian Prows  . Depression   . Elevated troponin I level   . Facial spasm   . Heart murmur    functional-normal echocardiogram  . Hypertension     Past Surgical History:  Procedure Laterality Date  . APPENDECTOMY     age 87  . CARDIAC CATHETERIZATION N/A  12/15/2014   Procedure: Left Heart Cath and Coronary Angiography;  Surgeon: Burnell Blanks, MD;  Location: [facility];  Service: Cardiovascular;  Laterality: N/A;  . CATARACT EXTRACTION     Right Eye  . DILATION AND CURETTAGE OF UTERUS  1990's   benign polyp  . TONSILLECTOMY AND ADENOIDECTOMY     --age 19     Current Outpatient Prescriptions  Medication Sig Dispense Refill  . amLODipine (NORVASC) 2.5 MG tablet Take 2.5 mg by mouth daily.    . Ascorbic Acid (VITAMIN C) 1000 MG tablet Take 1,000 mg by mouth daily before supper.     . cholecalciferol (VITAMIN D) 1000 UNITS tablet Take 1,000 Units by mouth at bedtime.     [name] diltiazem (CARDIZEM) 30 MG tablet Take 30 mg by mouth daily as needed (for heart rate >100).     . LORazepam (ATIVAN) 1 MG tablet Take 1 mg by mouth daily.     . Magnesium 400 MG CAPS Take 400 mg by mouth daily.    . nitroGLYCERIN (NITROSTAT) 0.4 MG SL tablet Place 0.4 mg under the tongue every 5 (five) minutes x 3 doses as  needed for chest pain.    . Omega-3 1400 MG CAPS Take 1,400 mg by mouth daily.    Vladimir Faster Glycol-Propyl Glycol (SYSTANE OP) Place 1 drop into both eyes 2 (two) times daily.    [name] RESVERATROL-GRAPE PO Take 1 tablet by mouth at bedtime.    . Turmeric 500 MG CAPS Take 500 mg by mouth at bedtime.     No current facility-administered medications for this visit.     Allergies:   Augmentin [amoxicillin-pot clavulanate]; Ciprofloxacin; Diltiazem; Epinephrine; Novocain [procaine]; Avapro [irbesartan]; Shrimp [shellfish allergy]; Iohexol; Penicillins; and Zithromax [azithromycin]    Social History:  The patient  reports that she has never smoked. She has never used smokeless tobacco. She reports that she drinks about 8.4 oz of alcohol per week . She reports that she does not use drugs.   Family History:  The patient's family history includes Cancer (age of onset: 8) in her father; Heart attack in her maternal grandmother; Hypertension in her maternal grandmother and mother; Renal Disease (age of onset: 61) in her mother.    ROS:  Please see the history of present illness.    Review of Systems: Constitutional:  denies fever, chills, diaphoresis, appetite change and fatigue.  HEENT: denies photophobia, eye pain, redness, hearing loss, ear pain, congestion, sore throat, rhinorrhea, sneezing, neck pain, neck stiffness and tinnitus.  Respiratory: denies SOB, DOE, cough, chest tightness, and wheezing.  Cardiovascular: denies chest pain, palpitations and leg swelling.  Gastrointestinal: denies nausea, vomiting, abdominal pain, diarrhea, constipation, blood in stool.  Genitourinary: denies dysuria, urgency, frequency, hematuria, flank pain and difficulty urinating.  Musculoskeletal: denies  myalgias, back pain, joint swelling, arthralgias and gait problem.   Skin: denies pallor, rash and wound.  Neurological: denies dizziness, seizures, syncope, weakness, light-headedness, numbness and headaches.     Hematological: denies adenopathy, easy bruising, personal or family bleeding history.  Psychiatric/ Behavioral: denies suicidal ideation, mood changes, confusion, nervousness, sleep disturbance and agitation.       All other systems are reviewed and negative.    PHYSICAL EXAM: VS:  BP 140/84   Pulse 86   Wt 156 lb 8 oz (71 kg)   LMP 08/11/1988 (Approximate)   SpO2 96%   BMI 25.26 kg/m  , BMI Body mass index is 25.26 kg/m. GEN: Well nourished, well  developed, in no acute distress  HEENT: normal  Neck: no JVD, carotid bruits, or masses Cardiac: RRR; no murmurs, rubs, or gallops,no edema  Respiratory:  clear to auscultation bilaterally, normal work of breathing GI: soft, nontender, nondistended, + BS MS: no deformity or atrophy  Skin: warm and dry, no rash Neuro:  Strength and sensation are intact Psych: normal   EKG:  EKG is ordered today. NSR at 62.   LAE.  Inc. RBBB , TIW in the inferior leads.  no changes from previous    Recent Labs: 12/06/2016: ALT 16; BUN 13; Creatinine, Ser 0.99; Hemoglobin 13.7; Platelets 204; Potassium 4.1; Sodium 137    Lipid Panel    Component Value Date/Time   CHOL 194 01/18/2016 1101   TRIG 44 01/18/2016 1101   HDL 80 01/18/2016 1101   CHOLHDL 2.4 01/18/2016 1101   VLDL 9 01/18/2016 1101   LDLCALC 105 01/18/2016 1101      Wt Readings from Last 3 Encounters:  01/13/17 156 lb 8 oz (71 kg)  12/22/16 157 lb (71.2 kg)  12/09/16 157 lb 11.2 oz (71.5 kg)      Other studies Reviewed: Additional studies/ records that were reviewed today include: . Review of the above records demonstrates:    ASSESSMENT AND PLAN:  1.  CAD   . He was seen last year for some episodes of chest discomfort.  She was found have elevated troponin levels. Cardiac catheterization was essentially normal. She was found have very minimal coronary artery disease. She did have a subtle wall motion abnormalities so it was thought that she may have had some  myocarditis or perhaps occluded a very small branch that was not seen at cath.  2. Hyperlipidemia: Her lipid levels were mildly elevated last  She has not started her crestor   3.  Paroxysmal Atrial fib: We discussed [place] She is concerned because she has a hx of falling She is concerned that she has had some GI bleeding  Dr. Ola Spurr wants to have a pacer placed - we would then be able to get an idea of her AF burden .  We had a very long discussion about [place].  She does not want to start a DOAC at this point.  Will get her an appt with Dr. Caryl Comes for further discussion     Current medicines are reviewed at length with the patient today.  The patient does not have concerns regarding medicines.  The following changes have been made:  no change  Labs/ tests ordered today include:   No orders of the defined types were placed in this encounter.    Disposition:   Follow up with me in 1 year.      Mertie Moores, MD  01/13/2017 3:04 PM    [facility] [address], [place], [place]  [address] Phone: [phone]; Fax: [phone]

## 2017-01-13 NOTE — Patient Instructions (Addendum)
Medication Instructions:  Your physician recommends that you continue on your current medications as directed. Please refer to the Current Medication list given to you today.   Labwork: None Ordered   Testing/Procedures: None Ordered   Follow-Up: You have been referred to Dr. Caryl Comes for management of your atrial fibrillation -  Dr. Olin Pia scheduler [facility]) will call you to schedule   Your physician wants you to follow-up in: 1 year with Dr. Acie Fredrickson.  You will receive a reminder letter in the mail two months in advance. If you don't receive a letter, please call our office to schedule the follow-up appointment.   If you need a refill on your cardiac medications before your next appointment, please call your pharmacy.   Thank you for choosing [facility]! Christen Bame, RN [phone]

## 2017-01-15 ENCOUNTER — Telehealth: Payer: Self-pay | Admitting: Internal Medicine

## 2017-01-16 NOTE — Telephone Encounter (Signed)
Reviewed with Dr. Caryl Comes- ok to add on 01/21/17 for discussion of possible PPM per Dr. Ola Spurr. I have spoken with the patient and she is aware of Dr. Olin Pia recommendations.  She is agreeable to seeing him on 01/21/17 at 1:30 pm. She will finish wearing her monitor today- I advised Dr. Caryl Comes will review with her further at her office visit.

## 2017-01-16 NOTE — Telephone Encounter (Signed)
New message     Pt is wearing a 30 day monitor , today is the last day she is to wear it , she needs to know what to do from here, Pt is very confused on what is going on, would like a call back asap

## 2017-01-16 NOTE — Telephone Encounter (Signed)
Will need to review with Dr. Caryl Comes.

## 2017-01-20 ENCOUNTER — Encounter: Payer: Self-pay | Admitting: *Deleted

## 2017-01-21 ENCOUNTER — Ambulatory Visit (INDEPENDENT_AMBULATORY_CARE_PROVIDER_SITE_OTHER): Payer: Medicare Other | Admitting: Internal Medicine

## 2017-01-21 VITALS — BP 122/70 | HR 88 | Ht 66.0 in | Wt 153.0 lb

## 2017-01-21 DIAGNOSIS — I480 Paroxysmal atrial fibrillation: Secondary | ICD-10-CM

## 2017-01-21 DIAGNOSIS — Z01812 Encounter for preprocedural laboratory examination: Secondary | ICD-10-CM | POA: Diagnosis not present

## 2017-01-21 DIAGNOSIS — I2510 Atherosclerotic heart disease of native coronary artery without angina pectoris: Secondary | ICD-10-CM

## 2017-01-21 NOTE — Patient Instructions (Addendum)
Medication Instructions: Your physician recommends that you continue on your current medications as directed. Please refer to the Current Medication list given to you today.   Labwork: Your physician recommends that you return for lab work February 12, 2017 at 10:00 am for BMET and  CBC  Procedures/Testing: Your physician has recommended that you have a pacemaker inserted. A pacemaker is a small device that is placed under the skin of your chest or abdomen to help control abnormal heart rhythms. This device uses electrical pulses to prompt the heart to beat at a normal rate. Pacemakers are used to treat heart rhythms that are too slow. Wire (leads) are attached to the pacemaker that goes into the chambers of you heart. This is done in the hospital and usually requires and overnight stay. Please see the instruction sheet given to you today for more information.---02/16/17   Please arrive at [facility] at 5:30am Do not eat or drink after midnight the night prior to the procedure Do not take any medications the morning of the test Use scrub as directed Plan for one night stay Will need someone to drive you home at discharge      Follow-Up: Your physician recommends that you schedule a follow-up appointment in 10 - 14 days after February 16, 2017 with the [facility] for your wound check  Your physician recommends that you schedule a follow-up appointment in 3 months after February 16, 2017 with Dr. Caryl Comes.    Any Additional Special Instructions Will Be Listed Below (If Applicable).     If you need a refill on your cardiac medications before your next appointment, please call your pharmacy.

## 2017-01-21 NOTE — Progress Notes (Signed)
Patient Care Team: Lajean Manes, MD as PCP - General (Internal Medicine)   HPI  Katherine Cardenas is a 76 y.o. female Seen in follow-up for a history of atrial arrhythmia and sinus node dysfunction. The latter had prompted the discontinuation of diltiazem about 4 years ago. When she was in the hospital she was having bradycardia again and was elected to discontinue her propafenone. With that there was normalization of her heart rate.  She had been hospitalized 4/18 for syncope associated with bradycardia   Cardiac catheterization 5/16 demonstrated mild nonobstructive coronary disease  Echocardiogram was normal 4/18   Records and Results Reviewed   Event recorder  atrial fibrillation/flutter at rates of 150. Sinus rates in the 50s. Nocturnal pauses greater than 3 seconds.  I spoke with Dr. Ola Spurr who is seeing the patient for many years. He is inclined towards pacing and subsequent resumption of antiarrhythmic therapy  Thromboembolic risk factors ( age  -2, HTN-1, Gender-1) for a CHADSVASc Score of 4   Past Medical History:  Diagnosis Date  . Anxiety   . Atrial fibrillation ([place])   . Atrial tachycardia [facility])    --sees cardiologist--Dr. Adrian Prows  . Depression   . Elevated troponin I level   . Facial spasm   . Heart murmur    functional-normal echocardiogram  . Hypertension     Past Surgical History:  Procedure Laterality Date  . APPENDECTOMY     age 89  . CARDIAC CATHETERIZATION N/A 12/15/2014   Procedure: Left Heart Cath and Coronary Angiography;  Surgeon: Burnell Blanks, MD;  Location: [facility];  Service: Cardiovascular;  Laterality: N/A;  . CATARACT EXTRACTION     Right Eye  . DILATION AND CURETTAGE OF UTERUS  1990's   benign polyp  . TONSILLECTOMY AND ADENOIDECTOMY     --age 30    Current Outpatient Prescriptions  Medication Sig Dispense Refill  . amLODipine (NORVASC) 2.5 MG tablet Take 2.5 mg by mouth daily.    . Ascorbic Acid  (VITAMIN C) 1000 MG tablet Take 1,000 mg by mouth daily before supper.     . cholecalciferol (VITAMIN D) 1000 UNITS tablet Take 1,000 Units by mouth at bedtime.     [name] diltiazem (CARDIZEM) 30 MG tablet Take 30 mg by mouth daily as needed (for heart rate >100).     . LORazepam (ATIVAN) 1 MG tablet Take 1 mg by mouth daily.     . Magnesium 400 MG CAPS Take 400 mg by mouth daily.    . nitroGLYCERIN (NITROSTAT) 0.4 MG SL tablet Place 0.4 mg under the tongue every 5 (five) minutes x 3 doses as needed for chest pain.    . Omega-3 1400 MG CAPS Take 1,400 mg by mouth daily.    Vladimir Faster Glycol-Propyl Glycol (SYSTANE OP) Place 1 drop into both eyes 2 (two) times daily.    [name] RESVERATROL-GRAPE PO Take 1 tablet by mouth at bedtime.    . Turmeric 500 MG CAPS Take 500 mg by mouth at bedtime.     No current facility-administered medications for this visit.     Allergies  Allergen Reactions  . Augmentin [Amoxicillin-Pot Clavulanate] Hives  . Ciprofloxacin Palpitations and Other (See Comments)    "irregular heart beat"  . Diltiazem Other (See Comments)    Pulse dropped too low  . Epinephrine Other (See Comments)    "NOVOCAIN"--could be the epinephrine----patient didn't tolerate-SYNCOPE    . Novocain [Procaine] Other (See Comments)  could be the epinephrine----patient didn't tolerate-SYNCOPE  . Avapro [Irbesartan] Swelling  . Shrimp [Shellfish Allergy] Hives  . Iohexol Itching and Rash     Code: RASH, Desc: patient states hx of possible reaction to ivp contrast years ago   . Penicillins Hives    Has patient had a PCN reaction causing immediate rash, facial/tongue/throat swelling, SOB or lightheadedness with hypotension: Yes Has patient had a PCN reaction causing severe rash involving mucus membranes or skin necrosis: No Has patient had a PCN reaction that required hospitalization No Has patient had a PCN reaction occurring within the last 10 years: No If all of the above answers are "NO", then  may proceed with Cephalosporin use.  . Zithromax [Azithromycin] Other (See Comments)    Cannot take with propafenone      Review of Systems negative except from HPI and PMH  Physical Exam BP 122/70   Pulse 88   Ht 5\' 6"  (1.676 m)   Wt 153 lb (69.4 kg)   LMP 08/11/1988 (Approximate)   SpO2 98%   BMI 24.69 kg/m   Well developed and nourished in no acute distress HENT normal Neck supple with JVP-flat Clear Regular rate and rhythm, no murmurs or gallops Abd-soft with active BS No Clubbing cyanosis edema Skin-warm and dry A & Oriented  Grossly normal sensory and motor function  ECG was not obtained today recorder is as noted above.   Assessment and  Plan  Syncope  Atrial fibrillation/tachycardia  Sinus bradycardia  Hypertension  The patient is symptomatic sinus node dysfunction with syncope. We hoped that it would resolve with discontinuation of her antiarrhythmic drugs unfortunately, she's had recurrence of both atrial arrhythmia and and sinus bradycardia. She is seeing Dr. Ola Spurr as noted above. We are both in agreement that it is reasonable to proceed with pacing becaus of her syncope with sinus node dysfunction which has been progressive over years. We would then at that point anticipate resuming antiarrhythmic therapy with propafenone.  She also anticoagulation.   We discussed the use of the NOACs compared to Coumadin. We briefly reviewed the data of at least comparability in stroke prevention, bleeding and outcome. We discussed some of the new once wherein somewhat associated with decreased ischemic stroke risk, one to be taken daily, and has been shown to be comparable and bleeding risk to aspirin.  We also discussed bleeding associated with warfarin as well as NOACs and a wall bleeding as a complication of all these drugs intracranial bleeding is more frequently associated with warfarin then the NOACs and a GI bleeding is more commonly associated with the  latter  We will anticipate His bundle pacing  More than 50% of 40 min was spent in counseling related to the above       Current medicines are reviewed at length with the patient today .  The patient does not  have concerns regarding medicines.

## 2017-01-22 ENCOUNTER — Telehealth: Payer: Self-pay | Admitting: Internal Medicine

## 2017-01-22 NOTE — Telephone Encounter (Signed)
PT  CALLED   HAD  SEVERAL  QUESTIONS   THE EPISODE  OF  AFIB   LASTED   FOR  QUITE SOME  TIME  CURRENTLY  FEELS OKAY  WITH  WHAT  FEELS LIKE  SKIPPED  BEATS   HAS  TAKEN UP TO  3  DILTIAZEM 30 MG IN  APPROX  12 -16 HOUR  PERIOD .PT  WAS  WONDERING IF  SHOULD TAKE  ROUTINELY  UNTIL   HAS  PM IMPLANT  DONE  WHICH IS  SCHEDULED FOR  02-16-17 .ALSO HAS NOT  STARTED  ELIQUIS  IS WANTING  TO KNOW  IF  SHOULD  GO AHEAD  AND  START  AS WELL  AS , SHOULD  SHE   MOVE  UP  PROCEDURE  FOR  WEEK OF  June  25 TH .WILL FORWARD TO DR Caryl Comes FOR  REVIEW .Adonis Housekeeper

## 2017-01-22 NOTE — Telephone Encounter (Signed)
Spoke with pt and is aware of recommendations and will go ahead and proceed with  procedure as planned for July 9./cy

## 2017-01-22 NOTE — Telephone Encounter (Signed)
Discussed with Dr Caryl Comes and pt may move up PM implant to  June  25 ,take  Diltiazem as needed for  afib, and not start Eliquis until after implant.

## 2017-01-22 NOTE — Telephone Encounter (Signed)
New Message      Had a new episode of AFIB that was worse than usual, she is taking her medication but she has a few question.

## 2017-02-10 ENCOUNTER — Other Ambulatory Visit: Payer: Medicare Other | Admitting: *Deleted

## 2017-02-10 DIAGNOSIS — E7800 Pure hypercholesterolemia, unspecified: Secondary | ICD-10-CM | POA: Diagnosis not present

## 2017-02-10 DIAGNOSIS — I10 Essential (primary) hypertension: Secondary | ICD-10-CM

## 2017-02-10 DIAGNOSIS — F488 Other specified nonpsychotic mental disorders: Secondary | ICD-10-CM | POA: Diagnosis not present

## 2017-02-10 DIAGNOSIS — I2510 Atherosclerotic heart disease of native coronary artery without angina pectoris: Secondary | ICD-10-CM | POA: Diagnosis not present

## 2017-02-10 NOTE — Addendum Note (Signed)
Addended by: Eulis Foster on: 02/10/2017 10:22 AM   Modules accepted: Orders

## 2017-02-11 LAB — CBC WITH DIFFERENTIAL/PLATELET
Basophils Absolute: 0 10*3/uL (ref 0.0–0.2)
Basos: 0 %
EOS (ABSOLUTE): 0.1 10*3/uL (ref 0.0–0.4)
EOS: 1 %
HEMATOCRIT: 44 % (ref 34.0–46.6)
Hemoglobin: 14.8 g/dL (ref 11.1–15.9)
IMMATURE GRANULOCYTES: 0 %
Immature Grans (Abs): 0 10*3/uL (ref 0.0–0.1)
LYMPHS ABS: 2.1 10*3/uL (ref 0.7–3.1)
Lymphs: 31 %
MCH: 31.1 pg (ref 26.6–33.0)
MCHC: 33.6 g/dL (ref 31.5–35.7)
MCV: 92 fL (ref 79–97)
MONOS ABS: 0.7 10*3/uL (ref 0.1–0.9)
Monocytes: 10 %
NEUTROS PCT: 58 %
Neutrophils Absolute: 4 10*3/uL (ref 1.4–7.0)
PLATELETS: 229 10*3/uL (ref 150–379)
RBC: 4.76 x10E6/uL (ref 3.77–5.28)
RDW: 13.3 % (ref 12.3–15.4)
WBC: 6.9 10*3/uL (ref 3.4–10.8)

## 2017-02-11 LAB — BASIC METABOLIC PANEL
BUN / CREAT RATIO: 12 (ref 12–28)
BUN: 10 mg/dL (ref 8–27)
CHLORIDE: 101 mmol/L (ref 96–106)
CO2: 24 mmol/L (ref 20–29)
Calcium: 9.8 mg/dL (ref 8.7–10.3)
Creatinine, Ser: 0.84 mg/dL (ref 0.57–1.00)
GFR calc Af Amer: 77 mL/min/{1.73_m2} (ref >59)
GFR calc non Af Amer: 67 mL/min/{1.73_m2} (ref >59)
GLUCOSE: 96 mg/dL (ref 65–99)
POTASSIUM: 4.2 mmol/L (ref 3.5–5.2)
SODIUM: 141 mmol/L (ref 134–144)

## 2017-02-12 ENCOUNTER — Other Ambulatory Visit: Payer: Medicare Other

## 2017-02-12 DIAGNOSIS — I480 Paroxysmal atrial fibrillation: Secondary | ICD-10-CM | POA: Diagnosis not present

## 2017-02-12 DIAGNOSIS — I10 Essential (primary) hypertension: Secondary | ICD-10-CM | POA: Diagnosis not present

## 2017-02-16 ENCOUNTER — Ambulatory Visit (HOSPITAL_COMMUNITY)
Admission: RE | Admit: 2017-02-16 | Discharge: 2017-02-17 | Disposition: A | Discharge status: Home / Self Care | Payer: Medicare Other | Source: Ambulatory Visit | Attending: Internal Medicine | Admitting: Internal Medicine

## 2017-02-16 ENCOUNTER — Encounter (HOSPITAL_COMMUNITY)
Admission: RE | Disposition: A | Discharge status: Home / Self Care | Payer: Self-pay | Source: Ambulatory Visit | Attending: Internal Medicine

## 2017-02-16 ENCOUNTER — Encounter (HOSPITAL_COMMUNITY): Payer: Self-pay

## 2017-02-16 DIAGNOSIS — R55 Syncope and collapse: Secondary | ICD-10-CM | POA: Diagnosis not present

## 2017-02-16 DIAGNOSIS — F419 Anxiety disorder, unspecified: Secondary | ICD-10-CM | POA: Diagnosis not present

## 2017-02-16 DIAGNOSIS — F329 Major depressive disorder, single episode, unspecified: Secondary | ICD-10-CM | POA: Insufficient documentation

## 2017-02-16 DIAGNOSIS — Z880 Allergy status to penicillin: Secondary | ICD-10-CM | POA: Diagnosis not present

## 2017-02-16 DIAGNOSIS — Z959 Presence of cardiac and vascular implant and graft, unspecified: Secondary | ICD-10-CM

## 2017-02-16 DIAGNOSIS — I495 Sick sinus syndrome: Secondary | ICD-10-CM | POA: Diagnosis not present

## 2017-02-16 DIAGNOSIS — I10 Essential (primary) hypertension: Secondary | ICD-10-CM | POA: Diagnosis not present

## 2017-02-16 DIAGNOSIS — I471 Supraventricular tachycardia: Secondary | ICD-10-CM | POA: Diagnosis not present

## 2017-02-16 DIAGNOSIS — Z7982 Long term (current) use of aspirin: Secondary | ICD-10-CM | POA: Diagnosis not present

## 2017-02-16 DIAGNOSIS — I480 Paroxysmal atrial fibrillation: Secondary | ICD-10-CM | POA: Diagnosis not present

## 2017-02-16 DIAGNOSIS — Z91013 Allergy to seafood: Secondary | ICD-10-CM | POA: Diagnosis not present

## 2017-02-16 HISTORY — PX: PACEMAKER IMPLANT: EP1218

## 2017-02-16 LAB — SURGICAL PCR SCREEN
MRSA, PCR: NEGATIVE
Staphylococcus aureus: NEGATIVE

## 2017-02-16 SURGERY — PACEMAKER IMPLANT

## 2017-02-16 MED ORDER — SODIUM CHLORIDE 0.9 % IR SOLN
80.0000 mg | Status: AC
  Administered 2017-02-16: 80 mg

## 2017-02-16 MED ORDER — RISAQUAD PO CAPS
1.0000 | ORAL_CAPSULE | Freq: Every day | ORAL | Status: DC
  Administered 2017-02-16 – 2017-02-17 (×2): 1 via ORAL
  Filled 2017-02-16 (×2): qty 1

## 2017-02-16 MED ORDER — PROPAFENONE HCL 225 MG PO TABS
225.0000 mg | ORAL_TABLET | Freq: Two times a day (BID) | ORAL | Status: DC
  Filled 2017-02-16 (×2): qty 1

## 2017-02-16 MED ORDER — VANCOMYCIN HCL IN DEXTROSE 1-5 GM/200ML-% IV SOLN
1000.0000 mg | INTRAVENOUS | Status: AC
  Administered 2017-02-16: 1000 mg via INTRAVENOUS

## 2017-02-16 MED ORDER — ONDANSETRON HCL 4 MG/2ML IJ SOLN: 4.0000 mg | Freq: Four times a day (QID) | INTRAMUSCULAR | Status: DC | PRN

## 2017-02-16 MED ORDER — MUPIROCIN 2 % EX OINT
1.0000 "application " | TOPICAL_OINTMENT | Freq: Once | CUTANEOUS | Status: AC
  Administered 2017-02-16: 1 via TOPICAL

## 2017-02-16 MED ORDER — LIDOCAINE HCL (PF) 1 % IJ SOLN
INTRAMUSCULAR | Status: AC
  Filled 2017-02-16: qty 60

## 2017-02-16 MED ORDER — FENTANYL CITRATE (PF) 100 MCG/2ML IJ SOLN
INTRAMUSCULAR | Status: AC
  Filled 2017-02-16: qty 2

## 2017-02-16 MED ORDER — LIDOCAINE HCL (PF) 1 % IJ SOLN
INTRAMUSCULAR | Status: DC | PRN
  Administered 2017-02-16: 40 mL via INTRADERMAL

## 2017-02-16 MED ORDER — AMLODIPINE BESYLATE 2.5 MG PO TABS
2.5000 mg | ORAL_TABLET | Freq: Every day | ORAL | Status: DC
  Administered 2017-02-16 – 2017-02-17 (×2): 2.5 mg via ORAL
  Filled 2017-02-16 (×2): qty 1

## 2017-02-16 MED ORDER — VITAMIN D 1000 UNITS PO TABS
1000.0000 [IU] | ORAL_TABLET | Freq: Every day | ORAL | Status: DC
  Administered 2017-02-16: 1000 [IU] via ORAL
  Filled 2017-02-16: qty 1

## 2017-02-16 MED ORDER — VITAMIN B-12 1000 MCG PO TABS
1000.0000 ug | ORAL_TABLET | Freq: Every day | ORAL | Status: DC
  Administered 2017-02-16 – 2017-02-17 (×2): 1000 ug via ORAL
  Filled 2017-02-16 (×2): qty 1

## 2017-02-16 MED ORDER — HEPARIN (PORCINE) IN NACL 2-0.9 UNIT/ML-% IJ SOLN
INTRAMUSCULAR | Status: AC | PRN
  Administered 2017-02-16: 500 mL

## 2017-02-16 MED ORDER — VANCOMYCIN HCL IN DEXTROSE 1-5 GM/200ML-% IV SOLN
1000.0000 mg | Freq: Two times a day (BID) | INTRAVENOUS | Status: AC
  Administered 2017-02-16: 1000 mg via INTRAVENOUS
  Filled 2017-02-16: qty 200

## 2017-02-16 MED ORDER — ASPIRIN EC 81 MG PO TBEC
162.0000 mg | DELAYED_RELEASE_TABLET | Freq: Every day | ORAL | Status: DC
  Administered 2017-02-17: 162 mg via ORAL
  Filled 2017-02-16 (×3): qty 2

## 2017-02-16 MED ORDER — FENTANYL CITRATE (PF) 100 MCG/2ML IJ SOLN
INTRAMUSCULAR | Status: DC | PRN
  Administered 2017-02-16 (×2): 25 ug via INTRAVENOUS
  Administered 2017-02-16 (×2): 12.5 ug via INTRAVENOUS
  Administered 2017-02-16: 25 ug via INTRAVENOUS

## 2017-02-16 MED ORDER — SODIUM CHLORIDE 0.9 % IV SOLN: INTRAVENOUS | Status: DC

## 2017-02-16 MED ORDER — DIPHENHYDRAMINE HCL 50 MG/ML IJ SOLN
25.0000 mg | Freq: Once | INTRAMUSCULAR | Status: AC
  Administered 2017-02-16: 25 mg via INTRAVENOUS

## 2017-02-16 MED ORDER — SODIUM CHLORIDE 0.9 % IV SOLN
INTRAVENOUS | Status: DC
  Administered 2017-02-16: 07:00:00 via INTRAVENOUS

## 2017-02-16 MED ORDER — VANCOMYCIN HCL IN DEXTROSE 1-5 GM/200ML-% IV SOLN
INTRAVENOUS | Status: AC
  Filled 2017-02-16: qty 200

## 2017-02-16 MED ORDER — VITAMIN C 500 MG PO TABS
1000.0000 mg | ORAL_TABLET | Freq: Every day | ORAL | Status: DC
  Administered 2017-02-16: 1000 mg via ORAL
  Filled 2017-02-16: qty 2

## 2017-02-16 MED ORDER — DILTIAZEM HCL 30 MG PO TABS: 30.0000 mg | ORAL_TABLET | Freq: Every day | ORAL | Status: DC | PRN

## 2017-02-16 MED ORDER — METHYLPREDNISOLONE SODIUM SUCC 125 MG IJ SOLR
125.0000 mg | Freq: Once | INTRAMUSCULAR | Status: AC
  Administered 2017-02-16: 125 mg via INTRAVENOUS

## 2017-02-16 MED ORDER — MAGNESIUM 200 MG PO TABS
400.0000 mg | ORAL_TABLET | Freq: Every day | ORAL | Status: DC
  Filled 2017-02-16: qty 2

## 2017-02-16 MED ORDER — FAMOTIDINE IN NACL 20-0.9 MG/50ML-% IV SOLN
20.0000 mg | Freq: Once | INTRAVENOUS | Status: AC
  Administered 2017-02-16: 20 mg via INTRAVENOUS

## 2017-02-16 MED ORDER — LORAZEPAM 1 MG PO TABS
1.0000 mg | ORAL_TABLET | Freq: Every day | ORAL | Status: DC
  Administered 2017-02-17: 1 mg via ORAL
  Filled 2017-02-16: qty 1

## 2017-02-16 MED ORDER — HEPARIN (PORCINE) IN NACL 2-0.9 UNIT/ML-% IJ SOLN
INTRAMUSCULAR | Status: AC
Start: 2017-02-16 — End: 2017-02-16
  Filled 2017-02-16: qty 500

## 2017-02-16 MED ORDER — METHYLPREDNISOLONE SODIUM SUCC 125 MG IJ SOLR
INTRAMUSCULAR | Status: AC
  Administered 2017-02-16: 125 mg via INTRAVENOUS
  Filled 2017-02-16: qty 2

## 2017-02-16 MED ORDER — MIDAZOLAM HCL 5 MG/5ML IJ SOLN
INTRAMUSCULAR | Status: AC
Start: 2017-02-16 — End: 2017-02-16
  Filled 2017-02-16: qty 5

## 2017-02-16 MED ORDER — SODIUM CHLORIDE 0.9 % IR SOLN
Status: AC
  Filled 2017-02-16: qty 2

## 2017-02-16 MED ORDER — ACETAMINOPHEN 325 MG PO TABS: 325.0000 mg | ORAL_TABLET | ORAL | Status: DC | PRN

## 2017-02-16 MED ORDER — MIDAZOLAM HCL 5 MG/5ML IJ SOLN
INTRAMUSCULAR | Status: DC | PRN
  Administered 2017-02-16 (×6): 1 mg via INTRAVENOUS

## 2017-02-16 MED ORDER — NITROGLYCERIN 0.4 MG SL SUBL: 0.4000 mg | SUBLINGUAL_TABLET | SUBLINGUAL | Status: DC | PRN

## 2017-02-16 MED ORDER — DIPHENHYDRAMINE HCL 50 MG/ML IJ SOLN
INTRAMUSCULAR | Status: AC
  Administered 2017-02-16: 25 mg via INTRAVENOUS
  Filled 2017-02-16: qty 1

## 2017-02-16 MED ORDER — MUPIROCIN 2 % EX OINT
TOPICAL_OINTMENT | CUTANEOUS | Status: AC
  Administered 2017-02-16: 1 via TOPICAL
  Filled 2017-02-16: qty 22

## 2017-02-16 MED ORDER — MAGNESIUM OXIDE 400 (241.3 MG) MG PO TABS
400.0000 mg | ORAL_TABLET | Freq: Every day | ORAL | Status: DC
  Administered 2017-02-16 – 2017-02-17 (×2): 400 mg via ORAL
  Filled 2017-02-16 (×2): qty 1

## 2017-02-16 MED ORDER — FAMOTIDINE IN NACL 20-0.9 MG/50ML-% IV SOLN
INTRAVENOUS | Status: AC
  Administered 2017-02-16: 20 mg via INTRAVENOUS
  Filled 2017-02-16: qty 50

## 2017-02-16 MED ORDER — MIDAZOLAM HCL 5 MG/5ML IJ SOLN
INTRAMUSCULAR | Status: AC
  Filled 2017-02-16: qty 5

## 2017-02-16 MED ORDER — CHLORHEXIDINE GLUCONATE 4 % EX LIQD
60.0000 mL | Freq: Once | CUTANEOUS | Status: DC
  Filled 2017-02-16: qty 60

## 2017-02-16 MED ORDER — TURMERIC 500 MG PO CAPS: 500.0000 mg | ORAL_CAPSULE | Freq: Every day | ORAL | Status: DC

## 2017-02-16 SURGICAL SUPPLY — 12 items
CABLE SURGICAL S-101-97-12 (CABLE) ×3 IMPLANT
CATH RIGHTSITE C315HIS02 (CATHETERS) ×3 IMPLANT
IPG PACE AZUR XT DR MRI W1DR01 (Pacemaker) ×1 IMPLANT
LEAD CAPSURE NOVUS 45CM (Lead) ×3 IMPLANT
LEAD SELECT SECURE 3830 383069 (Lead) ×1 IMPLANT
PACE AZURE XT DR MRI W1DR01 (Pacemaker) ×3 IMPLANT
PAD DEFIB LIFELINK (PAD) ×3 IMPLANT
SELECT SECURE 3830 383069 (Lead) ×3 IMPLANT
SHEATH CLASSIC 7F (SHEATH) ×6 IMPLANT
SLITTER 6232ADJ (MISCELLANEOUS) ×3 IMPLANT
TRAY PACEMAKER INSERTION (PACKS) ×3 IMPLANT
WIRE HI TORQ VERSACORE-J 145CM (WIRE) ×3 IMPLANT

## 2017-02-16 NOTE — Interval H&P Note (Signed)
History and Physical Interval Note:  02/16/2017 7:21 AM  Katherine Cardenas  has presented today for surgery, with the diagnosis of bradicardia - afib -sss  The various methods of treatment have been discussed with the patient and family. After consideration of risks, benefits and other options for treatment, the patient has consented to  Procedure(s): Pacemaker Implant (N/A) as a surgical intervention .  The patient's history has been reviewed, patient examined, no change in status, stable for surgery.  I have reviewed the patient's chart and labs.  Questions were answered to the patient's satisfaction.     Virl Axe

## 2017-02-16 NOTE — Research (Signed)
[place] Informed Consent   Subject Name: Katherine Cardenas  Subject met inclusion and exclusion criteria.  The informed consent form, evaluation requirements and expectations were reviewed with the subject and questions and concerns were addressed prior to the signing of the consent form.  The subject verbalized understanding of the trail requirements.  The subject agreed to participate in the [facility] evaluation and signed the informed consent.  The informed consent was obtained prior to performance of any protocol-specific procedures for the subject.  A copy of the signed informed consent was given to the subject and a copy was placed in the subject's medical record.  Berneda Rose 02/16/2017, 4:11 PM

## 2017-02-16 NOTE — H&P (View-Only) (Signed)
Patient Care Team: Lajean Manes, MD as PCP - General (Internal Medicine)   HPI  Katherine Cardenas is a 76 y.o. female Seen in follow-up for a history of atrial arrhythmia and sinus node dysfunction. The latter had prompted the discontinuation of diltiazem about 4 years ago. When she was in the hospital she was having bradycardia again and was elected to discontinue her propafenone. With that there was normalization of her heart rate.  She had been hospitalized 4/18 for syncope associated with bradycardia   Cardiac catheterization 5/16 demonstrated mild nonobstructive coronary disease  Echocardiogram was normal 4/18   Records and Results Reviewed   Event recorder  atrial fibrillation/flutter at rates of 150. Sinus rates in the 50s. Nocturnal pauses greater than 3 seconds.  I spoke with Dr. Ola Spurr who is seeing the patient for many years. He is inclined towards pacing and subsequent resumption of antiarrhythmic therapy  Thromboembolic risk factors ( age  -2, HTN-1, Gender-1) for a CHADSVASc Score of 4   Past Medical History:  Diagnosis Date  . Anxiety   . Atrial fibrillation ([place])   . Atrial tachycardia [facility])    --sees cardiologist--Dr. Adrian Prows  . Depression   . Elevated troponin I level   . Facial spasm   . Heart murmur    functional-normal echocardiogram  . Hypertension     Past Surgical History:  Procedure Laterality Date  . APPENDECTOMY     age 68  . CARDIAC CATHETERIZATION N/A 12/15/2014   Procedure: Left Heart Cath and Coronary Angiography;  Surgeon: Burnell Blanks, MD;  Location: [place] [facility];  Service: Cardiovascular;  Laterality: N/A;  . CATARACT EXTRACTION     Right Eye  . DILATION AND CURETTAGE OF UTERUS  1990's   benign polyp  . TONSILLECTOMY AND ADENOIDECTOMY     --age 67    Current Outpatient Prescriptions  Medication Sig Dispense Refill  . amLODipine (NORVASC) 2.5 MG tablet Take 2.5 mg by mouth daily.    . Ascorbic Acid  (VITAMIN C) 1000 MG tablet Take 1,000 mg by mouth daily before supper.     . cholecalciferol (VITAMIN D) 1000 UNITS tablet Take 1,000 Units by mouth at bedtime.     [name] diltiazem (CARDIZEM) 30 MG tablet Take 30 mg by mouth daily as needed (for heart rate >100).     . LORazepam (ATIVAN) 1 MG tablet Take 1 mg by mouth daily.     . Magnesium 400 MG CAPS Take 400 mg by mouth daily.    . nitroGLYCERIN (NITROSTAT) 0.4 MG SL tablet Place 0.4 mg under the tongue every 5 (five) minutes x 3 doses as needed for chest pain.    . Omega-3 1400 MG CAPS Take 1,400 mg by mouth daily.    Vladimir Faster Glycol-Propyl Glycol (SYSTANE OP) Place 1 drop into both eyes 2 (two) times daily.    [name] RESVERATROL-GRAPE PO Take 1 tablet by mouth at bedtime.    . Turmeric 500 MG CAPS Take 500 mg by mouth at bedtime.     No current facility-administered medications for this visit.     Allergies  Allergen Reactions  . Augmentin [Amoxicillin-Pot Clavulanate] Hives  . Ciprofloxacin Palpitations and Other (See Comments)    "irregular heart beat"  . Diltiazem Other (See Comments)    Pulse dropped too low  . Epinephrine Other (See Comments)    "NOVOCAIN"--could be the epinephrine----patient didn't tolerate-SYNCOPE    . Novocain [Procaine] Other (See Comments)  could be the epinephrine----patient didn't tolerate-SYNCOPE  . Avapro [Irbesartan] Swelling  . Shrimp [Shellfish Allergy] Hives  . Iohexol Itching and Rash     Code: RASH, Desc: patient states hx of possible reaction to ivp contrast years ago   . Penicillins Hives    Has patient had a PCN reaction causing immediate rash, facial/tongue/throat swelling, SOB or lightheadedness with hypotension: Yes Has patient had a PCN reaction causing severe rash involving mucus membranes or skin necrosis: No Has patient had a PCN reaction that required hospitalization No Has patient had a PCN reaction occurring within the last 10 years: No If all of the above answers are "NO", then  may proceed with Cephalosporin use.  . Zithromax [Azithromycin] Other (See Comments)    Cannot take with propafenone      Review of Systems negative except from HPI and PMH  Physical Exam BP 122/70   Pulse 88   Ht 5\' 6"  (1.676 m)   Wt 153 lb (69.4 kg)   LMP 08/11/1988 (Approximate)   SpO2 98%   BMI 24.69 kg/m   Well developed and nourished in no acute distress HENT normal Neck supple with JVP-flat Clear Regular rate and rhythm, no murmurs or gallops Abd-soft with active BS No Clubbing cyanosis edema Skin-warm and dry A & Oriented  Grossly normal sensory and motor function  ECG was not obtained today recorder is as noted above.   Assessment and  Plan  Syncope  Atrial fibrillation/tachycardia  Sinus bradycardia  Hypertension  The patient is symptomatic sinus node dysfunction with syncope. We hoped that it would resolve with discontinuation of her antiarrhythmic drugs unfortunately, she's had recurrence of both atrial arrhythmia and and sinus bradycardia. She is seeing Dr. Ola Spurr as noted above. We are both in agreement that it is reasonable to proceed with pacing becaus of her syncope with sinus node dysfunction which has been progressive over years. We would then at that point anticipate resuming antiarrhythmic therapy with propafenone.  She also anticoagulation.   We discussed the use of the NOACs compared to Coumadin. We briefly reviewed the data of at least comparability in stroke prevention, bleeding and outcome. We discussed some of the new once wherein somewhat associated with decreased ischemic stroke risk, one to be taken daily, and has been shown to be comparable and bleeding risk to aspirin.  We also discussed bleeding associated with warfarin as well as NOACs and a wall bleeding as a complication of all these drugs intracranial bleeding is more frequently associated with warfarin then the NOACs and a GI bleeding is more commonly associated with the  latter  We will anticipate His bundle pacing  More than 50% of 40 min was spent in counseling related to the above       Current medicines are reviewed at length with the patient today .  The patient does not  have concerns regarding medicines.

## 2017-02-16 NOTE — Progress Notes (Signed)
PHARMACIST - PHYSICIAN ORDER COMMUNICATION  CONCERNING: P&T Medication Policy on Herbal Medications  DESCRIPTION:  This patient's order for:  Tumeric  has been noted.  This product(s) is classified as an "herbal" or natural product. Due to a lack of definitive safety studies or FDA approval, nonstandard manufacturing practices, plus the potential risk of unknown drug-drug interactions while on inpatient medications, the Pharmacy and Therapeutics Committee does not permit the use of "herbal" or natural products of this type within [facility].   ACTION TAKEN: The pharmacy department is unable to verify this order at this time and your patient has been informed of this safety policy. Please reevaluate patient's clinical condition at discharge and address if the herbal or natural product(s) should be resumed at that time.  Joycie Peek, Pharm.D. Clinical Pharmacist

## 2017-02-16 NOTE — Progress Notes (Signed)
Patient did not want to take antiarrhythmic medicine tonight, wants to talk to MD before restarting the medicine and she was concerned about taking the medicine with her scheduled antibiotic. Patient very anxious about taking antibiotic because she has had many reactions to antibiotics.

## 2017-02-17 ENCOUNTER — Ambulatory Visit (HOSPITAL_COMMUNITY): Payer: Medicare Other

## 2017-02-17 DIAGNOSIS — Z880 Allergy status to penicillin: Secondary | ICD-10-CM | POA: Diagnosis not present

## 2017-02-17 DIAGNOSIS — F419 Anxiety disorder, unspecified: Secondary | ICD-10-CM | POA: Diagnosis not present

## 2017-02-17 DIAGNOSIS — I471 Supraventricular tachycardia: Secondary | ICD-10-CM | POA: Diagnosis not present

## 2017-02-17 DIAGNOSIS — R55 Syncope and collapse: Secondary | ICD-10-CM | POA: Diagnosis not present

## 2017-02-17 DIAGNOSIS — I10 Essential (primary) hypertension: Secondary | ICD-10-CM | POA: Diagnosis not present

## 2017-02-17 DIAGNOSIS — I480 Paroxysmal atrial fibrillation: Secondary | ICD-10-CM | POA: Diagnosis not present

## 2017-02-17 DIAGNOSIS — Z7982 Long term (current) use of aspirin: Secondary | ICD-10-CM | POA: Diagnosis not present

## 2017-02-17 DIAGNOSIS — I495 Sick sinus syndrome: Secondary | ICD-10-CM | POA: Diagnosis not present

## 2017-02-17 DIAGNOSIS — Z91013 Allergy to seafood: Secondary | ICD-10-CM | POA: Diagnosis not present

## 2017-02-17 DIAGNOSIS — Z950 Presence of cardiac pacemaker: Secondary | ICD-10-CM | POA: Diagnosis not present

## 2017-02-17 DIAGNOSIS — F329 Major depressive disorder, single episode, unspecified: Secondary | ICD-10-CM | POA: Diagnosis not present

## 2017-02-17 MED ORDER — LORAZEPAM 1 MG PO TABS
1.0000 mg | ORAL_TABLET | Freq: Once | ORAL | Status: AC
  Administered 2017-02-17: 1 mg via ORAL
  Filled 2017-02-17: qty 1

## 2017-02-17 MED ORDER — PROPAFENONE HCL 150 MG PO TABS: 150.0000 mg | ORAL_TABLET | Freq: Two times a day (BID) | ORAL | 3 refills | Status: DC

## 2017-02-17 NOTE — Discharge Instructions (Signed)
° ° °  Supplemental Discharge Instructions for  Pacemaker/Defibrillator Patients  Activity No heavy lifting or vigorous activity with your left/right arm for 6 to 8 weeks.  Do not raise your left/right arm above your head for one week.  Gradually raise your affected arm as drawn below.             02/20/17                     02/21/17                    02/22/17                  02/23/17 __  NO DRIVING for until cleared to.  WOUND CARE - Keep the wound area clean and dry.  Do not get this area wet, no showers for 24 hours; you may shower on  02/17/17 evening   . - The tape/steri-strips on your wound will fall off; do not pull them off.  No bandage is needed on the site.  DO  NOT apply any creams, oils, or ointments to the wound area. - If you notice any drainage or discharge from the wound, any swelling or bruising at the site, or you develop a fever > 101? F after you are discharged home, call the office at once.  Special Instructions - You are still able to use cellular telephones; use the ear opposite the side where you have your pacemaker/defibrillator.  Avoid carrying your cellular phone near your device. - When traveling through airports, show security personnel your identification card to avoid being screened in the metal detectors.  Ask the security personnel to use the hand wand. - Avoid arc welding equipment, MRI testing (magnetic resonance imaging), TENS units (transcutaneous nerve stimulators).  Call the office for questions about other devices. - Avoid electrical appliances that are in poor condition or are not properly grounded. - Microwave ovens are safe to be near or to operate.  Additional information for defibrillator patients should your device go off: - If your device goes off ONCE and you feel fine afterward, notify the device clinic nurses. - If your device goes off ONCE and you do not feel well afterward, call 911. - If your device goes off TWICE, call 911. - If your  device goes off THREE times in one day, call 911.  DO NOT DRIVE YOURSELF OR A FAMILY MEMBER WITH A DEFIBRILLATOR TO THE HOSPITAL--CALL 911.

## 2017-02-17 NOTE — Discharge Summary (Signed)
ELECTROPHYSIOLOGY PROCEDURE DISCHARGE SUMMARY    Patient ID: Katherine Cardenas,  MRN: [id number], DOB/AGE: [id number] 78 y.o.  Admit date: 02/16/2017 Discharge date: 02/17/2017  Primary Care Physician: Lajean Manes, MD  Primary Cardiologist: Dr. Ola Spurr Electrophysiologist: Dr. Caryl Comes  Primary Discharge Diagnosis:  1. Sinus node dysunction 2. tacy-brady syndrome  Secondary Discharge Diagnosis:  1. Paroxysmal AFib/Atach     CHA2DS2Vasc is at least 4     In discussion with Dr. Caryl Comes, the patient was felt to have ATch as the predominant arrhythmia     He discussed with tpe patient, she will contiue Rythmol 150mg   BID (TID prn) and monitor rhythm via her device going forward. If AFib, will plan for a/c. 2. HTN  Allergies  Allergen Reactions  . Augmentin [Amoxicillin-Pot Clavulanate] Hives  . Ciprofloxacin Palpitations and Other (See Comments)    "irregular, rapid heart beat"  . Diltiazem Other (See Comments)    Pulse dropped too low  . Epinephrine Other (See Comments)    "NOVOCAIN"--could be the epinephrine----patient didn't tolerate-SYNCOPE    . Novocain [Procaine] Other (See Comments)    could be the epinephrine----patient didn't tolerate-SYNCOPE  . Avapro [Irbesartan] Swelling  . Shrimp [Shellfish Allergy] Hives  . Iohexol Itching and Rash     Code: RASH, Desc: patient states hx of possible reaction to ivp contrast years ago   . Penicillins Hives    Has patient had a PCN reaction causing immediate rash, facial/tongue/throat swelling, SOB or lightheadedness with hypotension: Yes Has patient had a PCN reaction causing severe rash involving mucus membranes or skin necrosis: No Has patient had a PCN reaction that required hospitalization No Has patient had a PCN reaction occurring within the last 10 years: No If all of the above answers are "NO", then may proceed with Cephalosporin use.  [name] Zithromax [Azithromycin] Other (See Comments)    Cannot take with propafenone      Procedures This Admission:  1.  Implantation of a MDT dual chamber PPM on 02/16/17 by Dr. Caryl Comes.  The patient received aMedtronic MRI compatible pulse generator serial number [id number]. Medtronic MRI compatible 3830 ventricular lead serial [id number] and a Medtronic MRI compatible atrial lead 5076 serial number [id number] . There were no immediate post procedure complications. 2.  CXR on 02/17/17 demonstrated no pneumothorax status post device implantation.   Brief HPI: Katherine Cardenas is a 76 y.o. female was referred to electrophysiology in the outpatient setting for consideration of PPM implantation.  Past medical history includes PAFibm syncope, sinus node dysfunction, HTN.  Risks, benefits, and alternatives to PPM implantation were reviewed with the patient who wished to proceed.   Hospital Course:  The patient was admitted and underwent implantation of a PPM with details as outlined above.  She was monitored on telemetry overnight which demonstrated SR, infrequent pacing.  Left chest was without hematoma or ecchymosis.  The device was interrogated and found to be functioning normally.  CXR was obtained and demonstrated no pneumothorax status post device implantation, patient is without cough/SOB or symptoms of illness, she is encouraged to ambulate.  Wound care, arm mobility, and restrictions were reviewed with the patient.   Dr. Caryl Comes discussed with the patient, she will contiue Rythmol 150mg   BID (TID prn. He felt her predominant rhythm was ATach, not AFib and will monitor rhythm via her device going forward. If she proves to have AFib will plan for a/c.  The patient and Dr. Caryl Comes had a lengthy discussion regarding her propafenone, she  reports historically she felt well and most comfortable with the 150mg  dosing BID and q8 only as needed, this is what was decided to move forward with.  The patient was examined by Dr. Caryl Comes, and considered stable for discharge to home. Anderson Malta Electrical engineer  has completed teaching/MDT representative has also completed teaching, her appointments for [employer] monitoring are in place.    Physical Exam: Vitals:   02/16/17 2050 02/17/17 0153 02/17/17 0340 02/17/17 0906  BP: 132/71 (!) 168/86 (!) 152/82 (!) 172/76  Pulse: 74 74 70 74  Resp: 17 17 17 16   Temp: 98.7 F (37.1 C) 98 F (36.7 C) 98.2 F (36.8 C) 98.9 F (37.2 C)  TempSrc: Oral Oral Oral Oral  SpO2: 98% 98% 100% 100%  Weight:   155 lb 9.6 oz (70.6 kg)   Height:        GEN- The patient is well appearing, alert and oriented x 3 today.   HEENT: normocephalic, atraumatic; sclera clear, conjunctiva pink; hearing intact; oropharynx clear; neck supple, no JVP Lungs- CTA b/l, normal work of breathing.  No wheezes, rales, rhonchi Heart- RRR, no murmurs, rubs or gallops, PMI not laterally displaced GI- soft, non-tender, non-distended Extremities- no clubbing, cyanosis, or edema MS- no significant deformity or atrophy Skin- warm and dry, no rash or lesion, left chest without hematoma/ecchymosis Psych- euthymic mood, full affect Neuro- no gross deficits   Labs:   Lab Results  Component Value Date   WBC 6.9 02/10/2017   HGB 14.8 02/10/2017   HCT 44.0 02/10/2017   MCV 92 02/10/2017   PLT 229 02/10/2017     Recent Labs Lab 02/10/17 1022  NA 141  K 4.2  CL 101  CO2 24  BUN 10  CREATININE 0.84  CALCIUM 9.8  GLUCOSE 96    Discharge Medications:  Allergies as of 02/17/2017      Reactions   Augmentin [amoxicillin-pot Clavulanate] Hives   Ciprofloxacin Palpitations, Other (See Comments)   "irregular, rapid heart beat"   Diltiazem Other (See Comments)   Pulse dropped too low   Epinephrine Other (See Comments)   "NOVOCAIN"--could be the epinephrine----patient didn't tolerate-SYNCOPE    Novocain [procaine] Other (See Comments)   could be the epinephrine----patient didn't tolerate-SYNCOPE   Avapro [irbesartan] Swelling   Shrimp [shellfish Allergy] Hives   Iohexol  Itching, Rash    Code: RASH, Desc: patient states hx of possible reaction to ivp contrast years ago   Penicillins Hives   Has patient had a PCN reaction causing immediate rash, facial/tongue/throat swelling, SOB or lightheadedness with hypotension: Yes Has patient had a PCN reaction causing severe rash involving mucus membranes or skin necrosis: No Has patient had a PCN reaction that required hospitalization No Has patient had a PCN reaction occurring within the last 10 years: No If all of the above answers are "NO", then may proceed with Cephalosporin use.   Zithromax [azithromycin] Other (See Comments)   Cannot take with propafenone      Medication List    TAKE these medications   amLODipine 2.5 MG tablet Commonly known as:  NORVASC Take 2.5 mg by mouth daily.   aspirin EC 81 MG tablet Take 162 mg by mouth daily.   cholecalciferol 1000 units tablet Commonly known as:  VITAMIN D Take 1,000 Units by mouth at bedtime.   diltiazem 30 MG tablet Commonly known as:  CARDIZEM Take 30 mg by mouth daily as needed (for heart rate >100).   IVAREST POISON IVY ITCH EX  Apply 1 application topically daily as needed (itching).   LORazepam 1 MG tablet Commonly known as:  ATIVAN Take 1 mg by mouth daily.   Magnesium 400 MG Caps Take 400 mg by mouth daily.   nitroGLYCERIN 0.4 MG SL tablet Commonly known as:  NITROSTAT Place 0.4 mg under the tongue every 5 (five) minutes x 3 doses as needed for chest pain.   Omega-3 1000 MG Caps Take 1,000 mg by mouth daily.   PROBIOTIC COMPLEX ACIDOPHILUS Caps Take 1 tablet by mouth daily. "Enzyme Probiotic Complex"   propafenone 150 MG tablet Commonly known as:  RYTHMOL Take 1 tablet (150 mg total) by mouth every 12 (twelve) hours. OK to use every 8 hours as discussed as needed for palpitations.   RESVERATROL-GRAPE PO Take 1 tablet by mouth at bedtime.   SYSTANE OP Place 1 drop into both eyes 2 (two) times daily.   Turmeric 500 MG  Caps Take 500 mg by mouth at bedtime.   vitamin B-12 1000 MCG tablet Commonly known as:  CYANOCOBALAMIN Take 1,000 mcg by mouth daily.   vitamin C 1000 MG tablet Take 1,000 mg by mouth daily before supper.       Disposition:  Home Discharge Instructions    Diet - low sodium heart healthy    Complete by:  As directed    Increase activity slowly    Complete by:  As directed      Follow-up Information    [facility] Follow up on 02/26/2017.   Specialty:  Cardiology Why:  2:00PM, wound check Contact information: [address], [address] [place]       Deboraha Sprang, MD Follow up on 05/26/2017.   Specialty:  Cardiology Why:  2:00PM Contact information: [address] [address] [phone]           Duration of Discharge Encounter: Greater than 30 minutes including physician time.  Venetia Night, PA-C 02/17/2017 10:16 AM

## 2017-02-17 NOTE — Progress Notes (Signed)
Patient feeling states her face looks red and her jaw is hurting after finishing antibiotic. Then she states she is just stressed out about everything that is going on. Patient requested an early dose of lorazepam, called MD and got it ordered, patient feeling better. Will continue to monitor.

## 2017-02-17 NOTE — Progress Notes (Signed)
Call placed to radiology to inquire about time until CXR can be completed; xray states they will send for pt now.

## 2017-02-18 ENCOUNTER — Telehealth: Payer: Self-pay | Admitting: Internal Medicine

## 2017-02-18 NOTE — Telephone Encounter (Signed)
Late entry.  Katherine Cardenas spoke with patient to clarify appointments and transmissions as it relates to the BluSync study.

## 2017-02-18 NOTE — Telephone Encounter (Signed)
Patient calling, states that she received message in reagrds to a missed appointment for today at 12:30 for remote check. Patient is enrolled in blusync. Please call to help.

## 2017-02-25 ENCOUNTER — Ambulatory Visit: Payer: Medicare Other | Admitting: Physician Assistant

## 2017-02-26 ENCOUNTER — Ambulatory Visit (INDEPENDENT_AMBULATORY_CARE_PROVIDER_SITE_OTHER): Payer: Medicare Other | Admitting: *Deleted

## 2017-02-26 DIAGNOSIS — I495 Sick sinus syndrome: Secondary | ICD-10-CM

## 2017-02-27 ENCOUNTER — Telehealth: Payer: Self-pay | Admitting: *Deleted

## 2017-02-27 NOTE — Telephone Encounter (Signed)
Patient called and states that she just woke up from a nap and felt awful.  She states her symptoms were nondescript--weakness, shakiness.  She felt disoriented when she first woke up but is now oriented.  She states she felt that she could not "do" any of her normal activities.  Pt reports she has been eating and drinking normally, ate lunch before her nap.  Pt denies hx of hypoglycemia, has no way to check her CBG at home.  No chest pain.  Her HR was 65bpm but she has not checked her BP yet.  Patient sent a transmission with her app.  Advised to check her BP and that I will review transmission when it is received and call her back.  She is agreeable to this plan.  Transmission received.  Presenting rhythm is As/Vs at 72bpm.  Lead trends stable.  No episodes, alerts, or abnormalities noted.  Symptoms have improved some since initial conversation.  Pt reports her BP is 154/88, a "little high" for her, but she feels it is related to worrying about this episode.  She has been taking her medications as prescribed.  Pt denies any changes at the device site, including redness, swelling, pain, drainage, fever, or chills.  Encouraged patient to call us if any of these symptoms develop.  She verbalizes understanding.  Advised patient to contact PCP office for recommendations and to seek emergency care if disorientation, weakness, shakiness, or other symptoms  worsen.  Patient verbalizes understanding of instructions and reports that her husband is a retired Garment/textile technologist so she feels comfortable staying at home for now.  She is appreciative of recommendations and reports that she is feeling reassured that her pacemaker is functioning appropriately.

## 2017-02-28 LAB — CUP PACEART INCLINIC DEVICE CHECK
Battery Voltage: 3.22 V
Brady Statistic AP VP Percent: 0.21 %
Brady Statistic AP VS Percent: 25.36 %
Brady Statistic AS VP Percent: 0.07 %
Brady Statistic AS VS Percent: 74.36 %
Implantable Lead Model: 3830
Implantable Lead Model: 5076
Lead Channel Impedance Value: 285 Ohm
Lead Channel Impedance Value: 323 Ohm
Lead Channel Impedance Value: 399 Ohm
Lead Channel Pacing Threshold Amplitude: 0.5 V
Lead Channel Pacing Threshold Amplitude: 0.5 V
Lead Channel Pacing Threshold Pulse Width: 1 ms
Lead Channel Sensing Intrinsic Amplitude: 5.875 mV
Lead Channel Sensing Intrinsic Amplitude: 6.5 mV
Lead Channel Setting Pacing Amplitude: 3.5 V
Lead Channel Setting Pacing Amplitude: 3.5 V
MDC IDC LEAD IMPLANT DT: 20180709
MDC IDC LEAD IMPLANT DT: 20180709
MDC IDC LEAD LOCATION: 753859
MDC IDC LEAD LOCATION: 753860
MDC IDC MSMT BATTERY REMAINING LONGEVITY: 150 mo
MDC IDC MSMT LEADCHNL RA PACING THRESHOLD PULSEWIDTH: 0.4 ms
MDC IDC MSMT LEADCHNL RA SENSING INTR AMPL: 0.625 mV
MDC IDC MSMT LEADCHNL RA SENSING INTR AMPL: 1 mV
MDC IDC MSMT LEADCHNL RV IMPEDANCE VALUE: 418 Ohm
MDC IDC PG IMPLANT DT: 20180709
MDC IDC SESS DTM: 20180719183214
MDC IDC SET LEADCHNL RV PACING PULSEWIDTH: 1 ms
MDC IDC SET LEADCHNL RV SENSING SENSITIVITY: 2 mV
MDC IDC STAT BRADY RA PERCENT PACED: 26.34 %
MDC IDC STAT BRADY RV PERCENT PACED: 0.28 %

## 2017-02-28 NOTE — Progress Notes (Signed)
Wound check appointment. Dermabond removed. Wound without redness or edema. Incision edges approximated, wound well healed. Normal device function. Thresholds, sensing, and impedances consistent with implant measurements. HBP test performed with use of rhythm strip, septal pacing noted from 6V to LOC. Device programmed at 3.5V for extra safety margin until 3 month visit. Histogram distribution appropriate for patient and level of activity. No mode switches or high ventricular rates noted. Patient educated about wound care, arm mobility, lifting restrictions. ROV in 6 weeks with AS, and 3 months with SK.  Patient also asked about whether or not her driving restriction from April had been lifted since ppm was implanted. Dr.Klein discussed situation with patient and recommended that she resume driving 6 weeks post implant. Patient verbalized understanding.

## 2017-03-03 ENCOUNTER — Ambulatory Visit (INDEPENDENT_AMBULATORY_CARE_PROVIDER_SITE_OTHER): Payer: Medicare Other | Admitting: *Deleted

## 2017-03-03 DIAGNOSIS — I495 Sick sinus syndrome: Secondary | ICD-10-CM

## 2017-03-03 LAB — CUP PACEART REMOTE DEVICE CHECK
Date Time Interrogation Session: 20180824093008
Implantable Lead Implant Date: 20180709
Implantable Lead Implant Date: 20180709
Implantable Lead Location: 753859
Implantable Pulse Generator Implant Date: 20180709
Lead Channel Setting Pacing Amplitude: 3.5 V
Lead Channel Setting Pacing Pulse Width: 1 ms
Lead Channel Setting Sensing Sensitivity: 2 mV
MDC IDC LEAD LOCATION: 753860
MDC IDC MSMT BATTERY REMAINING LONGEVITY: 150 mo
MDC IDC SET LEADCHNL RV PACING AMPLITUDE: 3.5 V

## 2017-03-04 NOTE — Progress Notes (Signed)
Remote pacemaker transmission.   

## 2017-03-05 ENCOUNTER — Encounter: Payer: Self-pay | Admitting: Cardiology

## 2017-03-06 NOTE — Progress Notes (Unsigned)
Subject consented to enrolled on 02/16/2017 [employer] Eval. [employer] on subject phone. Teaching provided to subject along with Medtronic contact number. Subject verbalized understanding

## 2017-03-17 ENCOUNTER — Ambulatory Visit (INDEPENDENT_AMBULATORY_CARE_PROVIDER_SITE_OTHER): Payer: Medicare Other | Admitting: *Deleted

## 2017-03-17 DIAGNOSIS — I495 Sick sinus syndrome: Secondary | ICD-10-CM | POA: Diagnosis not present

## 2017-03-18 ENCOUNTER — Encounter: Payer: Self-pay | Admitting: Cardiology

## 2017-03-18 NOTE — Progress Notes (Signed)
Remote pacemaker transmission.   

## 2017-03-24 DIAGNOSIS — L720 Epidermal cyst: Secondary | ICD-10-CM | POA: Diagnosis not present

## 2017-03-24 DIAGNOSIS — L718 Other rosacea: Secondary | ICD-10-CM | POA: Diagnosis not present

## 2017-04-07 LAB — CUP PACEART REMOTE DEVICE CHECK
Date Time Interrogation Session: 20180828110347
Implantable Lead Implant Date: 20180709
Implantable Lead Location: 753859
Implantable Lead Model: 3830
Implantable Lead Model: 5076
Implantable Pulse Generator Implant Date: 20180709
MDC IDC LEAD IMPLANT DT: 20180709
MDC IDC LEAD LOCATION: 753860

## 2017-04-09 NOTE — Progress Notes (Signed)
Electrophysiology Office Note Date: 04/10/2017  ID:  Eden, Toohey [birth year], MRN [id number]  PCP: Lajean Manes, MD Electrophysiologist: Caryl Comes  CC: 6 week HBP pacemaker follow-up  Katherine Cardenas is a 76 y.o. female seen today for Dr Caryl Comes.  She presents today for routine electrophysiology followup.  Since last being seen in our clinic, the patient reports doing very well.  She denies chest pain, palpitations, dyspnea, PND, orthopnea, nausea, vomiting, dizziness, syncope, edema, weight gain, or early satiety.  Device History: MDT dual chamber PPM implanted 2018 for tachy/brady syndrome   Past Medical History:  Diagnosis Date  . Anxiety   . Atrial fibrillation ([place])   . Atrial tachycardia ([place])   . Depression   . Facial spasm   . Hypertension   . Tachy-brady syndrome [facility])    a. s/p MDT His Bundle pacemaker implant 2018 - Dr Caryl Comes   Past Surgical History:  Procedure Laterality Date  . APPENDECTOMY     age 55  . CARDIAC CATHETERIZATION N/A 12/15/2014   Procedure: Left Heart Cath and Coronary Angiography;  Surgeon: Burnell Blanks, MD;  Location: [place] [facility];  Service: Cardiovascular;  Laterality: N/A;  . CATARACT EXTRACTION     Right Eye  . DILATION AND CURETTAGE OF UTERUS  1990's   benign polyp  . PACEMAKER IMPLANT N/A 02/16/2017   Procedure: Pacemaker Implant;  Surgeon: Deboraha Sprang, MD;  Location: [place] [facility];  Service: Cardiovascular;  Laterality: N/A;  . TONSILLECTOMY AND ADENOIDECTOMY     --age 46    Current Outpatient Prescriptions  Medication Sig Dispense Refill  . amLODipine (NORVASC) 2.5 MG tablet Take 2.5 mg by mouth daily.    . Ascorbic Acid (VITAMIN C) 1000 MG tablet Take 1,000 mg by mouth daily before supper.     [name] aspirin EC 81 MG tablet Take 162 mg by mouth daily.    . cholecalciferol (VITAMIN D) 1000 UNITS tablet Take 1,000 Units by mouth at bedtime.     [name] diltiazem (CARDIZEM) 30 MG tablet Take 30 mg by mouth daily as  needed (for heart rate >100).     . Diphenhyd-Calamine-Benzyl Alc (IVAREST POISON IVY ITCH EX) Apply 1 application topically daily as needed (itching).    [name] FINACEA 15 % FOAM Apply 1 application topically daily.    [name] LORazepam (ATIVAN) 1 MG tablet Take 1 mg by mouth daily.     . Magnesium 400 MG CAPS Take 400 mg by mouth daily.    . nitroGLYCERIN (NITROSTAT) 0.4 MG SL tablet Place 0.4 mg under the tongue every 5 (five) minutes x 3 doses as needed for chest pain.    . Omega-3 1000 MG CAPS Take 1,000 mg by mouth daily.    Vladimir Faster Glycol-Propyl Glycol (SYSTANE OP) Place 1 drop into both eyes 2 (two) times daily.    . Probiotic Product (PROBIOTIC COMPLEX ACIDOPHILUS) CAPS Take 1 tablet by mouth daily. "Enzyme Probiotic Complex"    . propafenone (RYTHMOL) 150 MG tablet Take 1 tablet (150 mg total) by mouth every 12 (twelve) hours. OK to use every 8 hours as discussed as needed for palpitations. 60 tablet 3  . RESVERATROL-GRAPE PO Take 1 tablet by mouth at bedtime.    . Turmeric 500 MG CAPS Take 500 mg by mouth at bedtime.    . vitamin B-12 (CYANOCOBALAMIN) 1000 MCG tablet Take 1,000 mcg by mouth daily.     No current facility-administered medications for this visit.  Allergies:   Augmentin [amoxicillin-pot clavulanate]; Ciprofloxacin; Diltiazem; Epinephrine; Novocain [procaine]; Avapro [irbesartan]; Shrimp [shellfish allergy]; Iohexol; Penicillins; and Zithromax [azithromycin]   Social History: Social History   Social History  . Marital status: Married    Spouse name: N/A  . Number of children: N/A  . Years of education: N/A   Occupational History  . Not on file.   Social History Main Topics  . Smoking status: Never Smoker  . Smokeless tobacco: Never Used  . Alcohol use 8.4 oz/week    14 Glasses of wine per week  . Drug use: No  . Sexual activity: No   Other Topics Concern  . Not on file   Social History Narrative  . No narrative on file    Family History: Family  History  Problem Relation Age of Onset  . Stomach cancer Father 46  . Hypertension Mother   . Renal Disease Mother 43  . Kidney failure Mother   . Hypertension Maternal Grandmother   . Heart attack Maternal Grandmother      Review of Systems: All other systems reviewed and are otherwise negative except as noted above.   Physical Exam: VS:  BP 122/84   Pulse 66   Ht 5\' 5"  (1.651 m)   Wt 152 lb 3.2 oz (69 kg)   LMP 08/11/1988 (Approximate)   SpO2 97%   BMI 25.33 kg/m  , BMI Body mass index is 25.33 kg/m.  GEN- The patient is well appearing, alert and oriented x 3 today.   HEENT: normocephalic, atraumatic; sclera clear, conjunctiva pink; hearing intact; oropharynx clear; neck supple  Lungs- Clear to ausculation bilaterally, normal work of breathing.  No wheezes, rales, rhonchi Heart- Regular rate and rhythm  GI- soft, non-tender, non-distended, bowel sounds present  Extremities- no clubbing, cyanosis, or edema  MS- no significant deformity or atrophy Skin- warm and dry, no rash or lesion; PPM pocket well healed Psych- euthymic mood, full affect Neuro- strength and sensation are intact  PPM Interrogation- reviewed in detail today,  See PACEART report  EKG:  EKG is ordered today. The ekg ordered today shows sinus rhythm, intermittent A pacing, intrinsic ventricular conduction  Recent Labs: 12/06/2016: ALT 16 02/10/2017: BUN 10; Creatinine, Ser 0.84; Hemoglobin 14.8; Platelets 229; Potassium 4.2; Sodium 141   Wt Readings from Last 3 Encounters:  04/10/17 152 lb 3.2 oz (69 kg)  02/17/17 155 lb 9.6 oz (70.6 kg)  01/21/17 153 lb (69.4 kg)     Other studies Reviewed: Additional studies/ records that were reviewed today include: Dr Olin Pia office notes  Assessment and Plan:  1.  Tachy/brady syndrome Normal PPM function See Pace Art report No changes today His Bundle pacing capture demonstrates septal pacing   2.  Persistent atrial arrhythmias Rhythmol resumed post  pacing AF burden by device interrogation 0% CHADS2VASC is 2, currently on ASA only - pt to discuss further with Dr Caryl Comes at next office visit.  3.  HTN Stable No change required today   Current medicines are reviewed at length with the patient today.   The patient does not have concerns regarding her medicines.  The following changes were made today:  none  Labs/ tests ordered today include: none Orders Placed This Encounter  Procedures  . CUP PACEART INCLINIC DEVICE CHECK     Disposition:   Follow up with Dr Caryl Comes as scheduled     Signed, Chanetta Marshall, NP 04/10/2017 11:09 AM  [facility] [address] [place]  [address] [phone] (office) [phone] (fax)

## 2017-04-10 ENCOUNTER — Encounter: Payer: Self-pay | Admitting: Nurse Practitioner

## 2017-04-10 ENCOUNTER — Ambulatory Visit (INDEPENDENT_AMBULATORY_CARE_PROVIDER_SITE_OTHER): Payer: Medicare Other | Admitting: Nurse Practitioner

## 2017-04-10 VITALS — BP 122/84 | HR 66 | Ht 65.0 in | Wt 152.2 lb

## 2017-04-10 DIAGNOSIS — I480 Paroxysmal atrial fibrillation: Secondary | ICD-10-CM

## 2017-04-10 DIAGNOSIS — I2510 Atherosclerotic heart disease of native coronary artery without angina pectoris: Secondary | ICD-10-CM

## 2017-04-10 DIAGNOSIS — I495 Sick sinus syndrome: Secondary | ICD-10-CM | POA: Diagnosis not present

## 2017-04-10 DIAGNOSIS — I10 Essential (primary) hypertension: Secondary | ICD-10-CM | POA: Diagnosis not present

## 2017-04-10 LAB — CUP PACEART INCLINIC DEVICE CHECK
Date Time Interrogation Session: 20180831102013
Implantable Lead Implant Date: 20180709
Implantable Lead Location: 753860
Implantable Lead Model: 5076
MDC IDC LEAD IMPLANT DT: 20180709
MDC IDC LEAD LOCATION: 753859
MDC IDC PG IMPLANT DT: 20180709

## 2017-04-10 MED ORDER — DILTIAZEM HCL 30 MG PO TABS: 30.0000 mg | ORAL_TABLET | Freq: Every day | ORAL | Status: DC | PRN

## 2017-04-10 NOTE — Patient Instructions (Signed)
Medication Instructions:  Your physician recommends that you continue on your current medications as directed. Please refer to the Current Medication list given to you today.   Labwork: None Ordered   Testing/Procedures: None Ordered  Follow-Up: Your physician recommends that you schedule a follow-up appointment in: with Dr. Caryl Comes as scheduled.  Any Other Special Instructions Will Be Listed Below (If Applicable).     If you need a refill on your cardiac medications before your next appointment, please call your pharmacy.

## 2017-04-15 DIAGNOSIS — J340 Abscess, furuncle and carbuncle of nose: Secondary | ICD-10-CM | POA: Diagnosis not present

## 2017-04-15 DIAGNOSIS — I10 Essential (primary) hypertension: Secondary | ICD-10-CM | POA: Diagnosis not present

## 2017-04-21 DIAGNOSIS — L0889 Other specified local infections of the skin and subcutaneous tissue: Secondary | ICD-10-CM | POA: Diagnosis not present

## 2017-04-21 DIAGNOSIS — L0109 Other impetigo: Secondary | ICD-10-CM | POA: Diagnosis not present

## 2017-04-27 DIAGNOSIS — I10 Essential (primary) hypertension: Secondary | ICD-10-CM | POA: Diagnosis not present

## 2017-04-27 DIAGNOSIS — Z0000 Encounter for general adult medical examination without abnormal findings: Secondary | ICD-10-CM | POA: Diagnosis not present

## 2017-04-27 DIAGNOSIS — F325 Major depressive disorder, single episode, in full remission: Secondary | ICD-10-CM | POA: Diagnosis not present

## 2017-04-27 DIAGNOSIS — R269 Unspecified abnormalities of gait and mobility: Secondary | ICD-10-CM | POA: Diagnosis not present

## 2017-04-27 DIAGNOSIS — I480 Paroxysmal atrial fibrillation: Secondary | ICD-10-CM | POA: Diagnosis not present

## 2017-04-30 ENCOUNTER — Telehealth: Payer: Self-pay | Admitting: Cardiology

## 2017-04-30 DIAGNOSIS — H26491 Other secondary cataract, right eye: Secondary | ICD-10-CM | POA: Diagnosis not present

## 2017-04-30 DIAGNOSIS — Z961 Presence of intraocular lens: Secondary | ICD-10-CM | POA: Diagnosis not present

## 2017-04-30 DIAGNOSIS — H2512 Age-related nuclear cataract, left eye: Secondary | ICD-10-CM | POA: Diagnosis not present

## 2017-04-30 NOTE — Telephone Encounter (Signed)
Patient called and stated that she has not been feeling good and she has had some palpitations. Pt sent a remote transmission.

## 2017-05-01 NOTE — Telephone Encounter (Signed)
Transmission received. SVT noted in the 150s 04/21/17. No episodes from yesterday, low PVC count. Pt felt poorly yesterday after a prolonged opthalmic procedure and shopping afterwards. She feels better today and is appreciative of call.

## 2017-05-05 DIAGNOSIS — M810 Age-related osteoporosis without current pathological fracture: Secondary | ICD-10-CM | POA: Diagnosis not present

## 2017-05-05 DIAGNOSIS — I480 Paroxysmal atrial fibrillation: Secondary | ICD-10-CM | POA: Diagnosis not present

## 2017-05-05 DIAGNOSIS — F325 Major depressive disorder, single episode, in full remission: Secondary | ICD-10-CM | POA: Diagnosis not present

## 2017-05-05 DIAGNOSIS — I10 Essential (primary) hypertension: Secondary | ICD-10-CM | POA: Diagnosis not present

## 2017-05-19 ENCOUNTER — Ambulatory Visit (INDEPENDENT_AMBULATORY_CARE_PROVIDER_SITE_OTHER): Payer: Self-pay | Admitting: *Deleted

## 2017-05-19 DIAGNOSIS — I495 Sick sinus syndrome: Secondary | ICD-10-CM

## 2017-05-20 LAB — CUP PACEART REMOTE DEVICE CHECK
Battery Remaining Longevity: 140 mo
Brady Statistic AP VS Percent: 48.87 %
Brady Statistic AS VP Percent: 0 %
Brady Statistic AS VS Percent: 51.09 %
Brady Statistic RV Percent Paced: 0.04 %
Date Time Interrogation Session: 20181009131421
Implantable Lead Implant Date: 20180709
Implantable Lead Location: 753859
Implantable Lead Model: 3830
Implantable Lead Model: 5076
Lead Channel Impedance Value: 285 Ohm
Lead Channel Impedance Value: 304 Ohm
Lead Channel Pacing Threshold Amplitude: 0.375 V
Lead Channel Pacing Threshold Pulse Width: 0.4 ms
Lead Channel Setting Pacing Amplitude: 3.25 V
MDC IDC LEAD IMPLANT DT: 20180709
MDC IDC LEAD LOCATION: 753860
MDC IDC MSMT BATTERY VOLTAGE: 3.18 V
MDC IDC MSMT LEADCHNL RA IMPEDANCE VALUE: 437 Ohm
MDC IDC MSMT LEADCHNL RA SENSING INTR AMPL: 2 mV
MDC IDC MSMT LEADCHNL RA SENSING INTR AMPL: 2 mV
MDC IDC MSMT LEADCHNL RV IMPEDANCE VALUE: 437 Ohm
MDC IDC MSMT LEADCHNL RV SENSING INTR AMPL: 9.5 mV
MDC IDC MSMT LEADCHNL RV SENSING INTR AMPL: 9.5 mV
MDC IDC PG IMPLANT DT: 20180709
MDC IDC SET LEADCHNL RV PACING AMPLITUDE: 3.5 V
MDC IDC SET LEADCHNL RV PACING PULSEWIDTH: 1 ms
MDC IDC SET LEADCHNL RV SENSING SENSITIVITY: 2 mV
MDC IDC STAT BRADY AP VP PERCENT: 0.04 %
MDC IDC STAT BRADY RA PERCENT PACED: 48.98 %

## 2017-05-20 NOTE — Progress Notes (Signed)
Remote pacemaker transmission.   

## 2017-05-22 ENCOUNTER — Encounter: Payer: Self-pay | Admitting: Cardiology

## 2017-05-26 ENCOUNTER — Encounter: Payer: Self-pay | Admitting: Internal Medicine

## 2017-05-26 ENCOUNTER — Ambulatory Visit (INDEPENDENT_AMBULATORY_CARE_PROVIDER_SITE_OTHER): Payer: Medicare Other | Admitting: Internal Medicine

## 2017-05-26 VITALS — BP 132/70 | HR 76 | Ht 66.0 in | Wt 155.0 lb

## 2017-05-26 DIAGNOSIS — I495 Sick sinus syndrome: Secondary | ICD-10-CM

## 2017-05-26 DIAGNOSIS — I2510 Atherosclerotic heart disease of native coronary artery without angina pectoris: Secondary | ICD-10-CM

## 2017-05-26 DIAGNOSIS — I480 Paroxysmal atrial fibrillation: Secondary | ICD-10-CM | POA: Diagnosis not present

## 2017-05-26 NOTE — Progress Notes (Signed)
Patient Care Team: Lajean Manes, MD as PCP - General (Internal Medicine)   HPI  Katherine Cardenas is a 76 y.o. female Seen in follow-up for a history of atrial arrhythmia and sinus node dysfunction.s/p Pacer 7/18  The latter had prompted the discontinuation of diltiazem about 4 years ago. When she was in the hospital she was having bradycardia again and was elected to discontinue her propafenone. With that there was normalization of her heart rate.  She had been hospitalized 4/18 for syncope associated with bradycardia   Cardiac catheterization 5/16 demonstrated mild nonobstructive coronary disease  Echocardiogram was normal 4/18   Records and Results Reviewed   Event recorder  atrial fibrillation/flutter at rates of 150. Sinus rates in the 50s. Nocturnal pauses greater than 3 seconds. Durations have been short   She feels better w pacer in place; without recurrent tachypalps  On ASA   Some tenderness at pacer site     Thromboembolic risk factors ( age  -2, HTN-1, Gender-1) for a CHADSVASc Score of 4   Past Medical History:  Diagnosis Date  . Anxiety   . Atrial fibrillation ([place])   . Atrial tachycardia ([place])   . Depression   . Facial spasm   . Hypertension   . Tachy-brady syndrome [facility])    a. s/p MDT His Bundle pacemaker implant 2018 - Dr Caryl Comes    Past Surgical History:  Procedure Laterality Date  . APPENDECTOMY     age 21  . CARDIAC CATHETERIZATION N/A 12/15/2014   Procedure: Left Heart Cath and Coronary Angiography;  Surgeon: Burnell Blanks, MD;  Location: [place] [facility];  Service: Cardiovascular;  Laterality: N/A;  . CATARACT EXTRACTION     Right Eye  . DILATION AND CURETTAGE OF UTERUS  1990's   benign polyp  . PACEMAKER IMPLANT N/A 02/16/2017   Procedure: Pacemaker Implant;  Surgeon: Deboraha Sprang, MD;  Location: [place] [facility];  Service: Cardiovascular;  Laterality: N/A;  . TONSILLECTOMY AND ADENOIDECTOMY     --age 54    Current  Outpatient Prescriptions  Medication Sig Dispense Refill  . amLODipine (NORVASC) 2.5 MG tablet Take 2.5 mg by mouth daily.    . Ascorbic Acid (VITAMIN C) 1000 MG tablet Take 1,000 mg by mouth daily before supper.     [name] aspirin EC 81 MG tablet Take 162 mg by mouth daily.    . cholecalciferol (VITAMIN D) 1000 UNITS tablet Take 1,000 Units by mouth at bedtime.     [name] diltiazem (CARDIZEM) 30 MG tablet Take 1 tablet (30 mg total) by mouth daily as needed (for heart rate >100).    . Diphenhyd-Calamine-Benzyl Alc (IVAREST POISON IVY ITCH EX) Apply 1 application topically daily as needed (itching).    [name] FINACEA 15 % FOAM Apply 1 application topically daily.    [name] LORazepam (ATIVAN) 1 MG tablet Take 1 mg by mouth daily.     . Magnesium 400 MG CAPS Take 400 mg by mouth daily.    . nitroGLYCERIN (NITROSTAT) 0.4 MG SL tablet Place 0.4 mg under the tongue every 5 (five) minutes x 3 doses as needed for chest pain.    . Omega-3 1000 MG CAPS Take 1,000 mg by mouth daily.    Vladimir Faster Glycol-Propyl Glycol (SYSTANE OP) Place 1 drop into both eyes 2 (two) times daily.    . Probiotic Product (PROBIOTIC COMPLEX ACIDOPHILUS) CAPS Take 1 tablet by mouth daily. "Enzyme Probiotic Complex"    .  propafenone (RYTHMOL) 150 MG tablet Take 1 tablet (150 mg total) by mouth every 12 (twelve) hours. OK to use every 8 hours as discussed as needed for palpitations. 60 tablet 3  . RESVERATROL-GRAPE PO Take 1 tablet by mouth at bedtime.    . Turmeric 500 MG CAPS Take 500 mg by mouth at bedtime.    . vitamin B-12 (CYANOCOBALAMIN) 1000 MCG tablet Take 1,000 mcg by mouth daily.     No current facility-administered medications for this visit.     Allergies  Allergen Reactions  . Augmentin [Amoxicillin-Pot Clavulanate] Hives  . Ciprofloxacin Palpitations and Other (See Comments)    "irregular, rapid heart beat"  . Diltiazem Other (See Comments)    Pulse dropped too low  . Epinephrine Other (See Comments)    "NOVOCAIN"--could be  the epinephrine----patient didn't tolerate-SYNCOPE    . Novocain [Procaine] Other (See Comments)    could be the epinephrine----patient didn't tolerate-SYNCOPE  . Avapro [Irbesartan] Swelling  . Shrimp [Shellfish Allergy] Hives  . Iohexol Itching and Rash     Code: RASH, Desc: patient states hx of possible reaction to ivp contrast years ago   . Penicillins Hives    Has patient had a PCN reaction causing immediate rash, facial/tongue/throat swelling, SOB or lightheadedness with hypotension: Yes Has patient had a PCN reaction causing severe rash involving mucus membranes or skin necrosis: No Has patient had a PCN reaction that required hospitalization No Has patient had a PCN reaction occurring within the last 10 years: No If all of the above answers are "NO", then may proceed with Cephalosporin use.  . Zithromax [Azithromycin] Other (See Comments)    Cannot take with propafenone      Review of Systems negative except from HPI and PMH  Physical Exam BP 132/70   Pulse 76   Ht 5\' 6"  (1.676 m)   Wt 155 lb (70.3 kg)   LMP 08/11/1988 (Approximate)   SpO2 98%   BMI 25.02 kg/m  Well developed and nourished in no acute distress HENT normal Neck supple with JVP-flat Carotids brisk and full without bruits Clear Device pocket well healed; without hematoma or erythema.  There is no tethering  Regular rate and rhythm, no murmurs or gallops Abd-soft with active BS without hepatomegaly No Clubbing cyanosis edema Skin-warm and dry A & Oriented  Grossly normal sensory and motor function  ECG P-synchronous/ AV  pacing with pseudofusion   Assessment and  Plan  Syncope  Atrial fibrillation/tachycardia  Sinus bradycardia  Pacemaker   Medtronic  Hypertension  45% Apaced  With good exercise tolerance  Will follow afib burden for anticoagulation==SCAF not enough to justify anticoagulation yet  Will discontinue ASA       Current medicines are reviewed at length with the  patient today .  The patient does not  have concerns regarding medicines.

## 2017-05-26 NOTE — Patient Instructions (Addendum)
Medication Instructions:  Your physician has recommended you make the following change in your medication:   1. Stop Aspirin  -- If you need a refill on your cardiac medications before your next appointment, please call your pharmacy. --  Labwork: None ordered  Testing/Procedures: None ordered  Follow-Up: Your physician wants you to follow-up in: 5 months with Dr. Caryl Comes.  You will receive a reminder letter in the mail two months in advance. If you don't receive a letter, please call our office to schedule the follow-up appointment.  Remote monitoring is used to monitor your Pacemaker from home. This monitoring reduces the number of office visits required to check your device to one time per year. It allows [place] to keep an eye on the functioning of your device to ensure it is working properly. You are scheduled for a device check from home on 08/18/2017. You may send your transmission at any time that day. If you have a wireless device, the transmission will be sent automatically. After your physician reviews your transmission, you will receive a postcard with your next transmission date.    Thank you for choosing [facility]!!   Frederik Schmidt, RN [phone]  Any Other Special Instructions Will Be Listed Below (If Applicable).

## 2017-05-27 LAB — CUP PACEART INCLINIC DEVICE CHECK
Brady Statistic AP VP Percent: 0.04 %
Brady Statistic AP VS Percent: 47.71 %
Brady Statistic AS VS Percent: 52.25 %
Brady Statistic RV Percent Paced: 0.04 %
Implantable Lead Implant Date: 20180709
Implantable Lead Implant Date: 20180709
Implantable Lead Location: 753860
Implantable Lead Model: 3830
Implantable Lead Model: 5076
Implantable Pulse Generator Implant Date: 20180709
Lead Channel Impedance Value: 304 Ohm
Lead Channel Impedance Value: 437 Ohm
Lead Channel Impedance Value: 456 Ohm
Lead Channel Pacing Threshold Amplitude: 0.75 V
Lead Channel Sensing Intrinsic Amplitude: 1.25 mV
Lead Channel Sensing Intrinsic Amplitude: 11.125 mV
Lead Channel Sensing Intrinsic Amplitude: 9.5 mV
Lead Channel Setting Pacing Amplitude: 2.25 V
Lead Channel Setting Pacing Amplitude: 2.5 V
Lead Channel Setting Pacing Pulse Width: 1 ms
Lead Channel Setting Sensing Sensitivity: 2 mV
MDC IDC LEAD LOCATION: 753859
MDC IDC MSMT BATTERY REMAINING LONGEVITY: 159 mo
MDC IDC MSMT BATTERY VOLTAGE: 3.18 V
MDC IDC MSMT LEADCHNL RA IMPEDANCE VALUE: 304 Ohm
MDC IDC MSMT LEADCHNL RA PACING THRESHOLD AMPLITUDE: 0.5 V
MDC IDC MSMT LEADCHNL RA PACING THRESHOLD PULSEWIDTH: 0.4 ms
MDC IDC MSMT LEADCHNL RA SENSING INTR AMPL: 1.375 mV
MDC IDC MSMT LEADCHNL RV PACING THRESHOLD PULSEWIDTH: 1 ms
MDC IDC SESS DTM: 20181016180716
MDC IDC STAT BRADY AS VP PERCENT: 0 %
MDC IDC STAT BRADY RA PERCENT PACED: 47.82 %

## 2017-06-04 DIAGNOSIS — M810 Age-related osteoporosis without current pathological fracture: Secondary | ICD-10-CM | POA: Diagnosis not present

## 2017-06-04 DIAGNOSIS — F325 Major depressive disorder, single episode, in full remission: Secondary | ICD-10-CM | POA: Diagnosis not present

## 2017-06-04 DIAGNOSIS — I480 Paroxysmal atrial fibrillation: Secondary | ICD-10-CM | POA: Diagnosis not present

## 2017-06-04 DIAGNOSIS — I10 Essential (primary) hypertension: Secondary | ICD-10-CM | POA: Diagnosis not present

## 2017-06-05 ENCOUNTER — Encounter: Payer: Self-pay | Admitting: Cardiology

## 2017-06-08 ENCOUNTER — Telehealth: Payer: Self-pay | Admitting: Cardiology

## 2017-06-08 NOTE — Telephone Encounter (Signed)
Remote from 10/28 @ 1035 reviewed. Presenting rhythm: ApVs. No atrial or ventricular arrhythmias recorded. Stable lead measurements.   Spoke to patient about her episode from yesterday where she briefly "lost awareness". Patient denies syncope. Patient wanted to make sure that she didn't experience a pause that may have caused her sx's. I explained to her that her pacemaker prevents her from having pauses, and being that it's working optimally, a pause is not likely the culprit. Patient verbalized understanding and appreciation of information. Patient also asked if some itching around her site was anything to worry about at this point. I explained to her that some itching is not uncommon. I explained signs and symptoms of infection. Patient also asked if having chills without a fever was anything to worry about. I told her that having occasional chills without the presence of a fever or other sx's of infection does not sound very worrisome either. Patient stated that she had many worries since she discovered her nasal infection, but she was very appreciative of information. I encouraged patient to call back if she has any further questions. Again, patient verbalized understanding.

## 2017-06-08 NOTE — Telephone Encounter (Signed)
New Message:     Please call,if not at home please call [phone].

## 2017-06-10 DIAGNOSIS — Z23 Encounter for immunization: Secondary | ICD-10-CM | POA: Diagnosis not present

## 2017-06-24 ENCOUNTER — Other Ambulatory Visit: Payer: Self-pay | Admitting: Physician Assistant

## 2017-06-24 DIAGNOSIS — L720 Epidermal cyst: Secondary | ICD-10-CM | POA: Diagnosis not present

## 2017-06-24 DIAGNOSIS — L718 Other rosacea: Secondary | ICD-10-CM | POA: Diagnosis not present

## 2017-07-06 DIAGNOSIS — I10 Essential (primary) hypertension: Secondary | ICD-10-CM | POA: Diagnosis not present

## 2017-07-06 DIAGNOSIS — L299 Pruritus, unspecified: Secondary | ICD-10-CM | POA: Diagnosis not present

## 2017-07-10 ENCOUNTER — Telehealth: Payer: Self-pay

## 2017-07-10 DIAGNOSIS — M810 Age-related osteoporosis without current pathological fracture: Secondary | ICD-10-CM | POA: Diagnosis not present

## 2017-07-10 DIAGNOSIS — F325 Major depressive disorder, single episode, in full remission: Secondary | ICD-10-CM | POA: Diagnosis not present

## 2017-07-10 DIAGNOSIS — I10 Essential (primary) hypertension: Secondary | ICD-10-CM | POA: Diagnosis not present

## 2017-07-10 DIAGNOSIS — I480 Paroxysmal atrial fibrillation: Secondary | ICD-10-CM | POA: Diagnosis not present

## 2017-07-10 NOTE — Telephone Encounter (Signed)
**Note De-Identified Deronte Solis Obfuscation** Approval on Propafenone at a lower co pay received Harmony Sandell fax from [place]. Approval good from 08/10/17 until 08/10/18. Referral number [id number]

## 2017-07-18 ENCOUNTER — Telehealth: Payer: Self-pay | Admitting: Cardiology

## 2017-07-18 NOTE — Telephone Encounter (Signed)
Pt called- says she was contacted by [employer] who told her that her pacemaker was "not connected" and to go online and connect it. She was unable to do this and when she called the [employer] number she was told they were closed till Monday. I call the MDT rep and she will contact the patient.   Kerin Ransom PA-C 07/18/2017 9:58 AM

## 2017-07-21 DIAGNOSIS — F325 Major depressive disorder, single episode, in full remission: Secondary | ICD-10-CM | POA: Diagnosis not present

## 2017-07-21 DIAGNOSIS — M810 Age-related osteoporosis without current pathological fracture: Secondary | ICD-10-CM | POA: Diagnosis not present

## 2017-07-21 DIAGNOSIS — I480 Paroxysmal atrial fibrillation: Secondary | ICD-10-CM | POA: Diagnosis not present

## 2017-07-21 DIAGNOSIS — I10 Essential (primary) hypertension: Secondary | ICD-10-CM | POA: Diagnosis not present

## 2017-07-30 IMAGING — US US ABDOMEN COMPLETE
1 series · 13 of 25 positions shown · non-contrast
Comparison: 09/12/2014

CLINICAL DATA: Left upper quadrant abdominal pain.  Renal cyst.

EXAM:
ABDOMEN ULTRASOUND COMPLETE

[Series 1: us abdomen complete · 0.26mm/px · 13 of 216 slices shown]
[im 1/216]
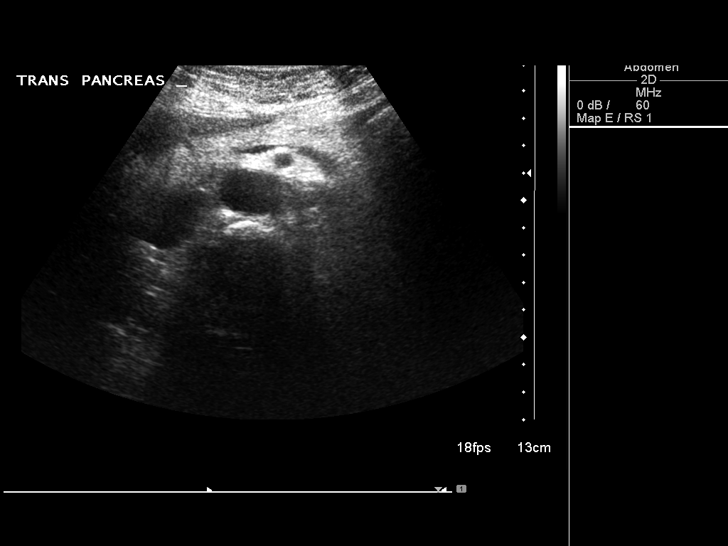
[im 18/216]
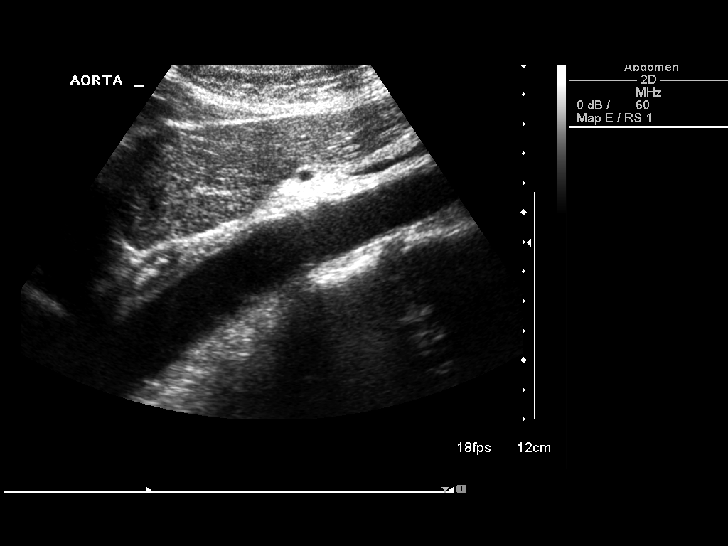
[im 36/216]
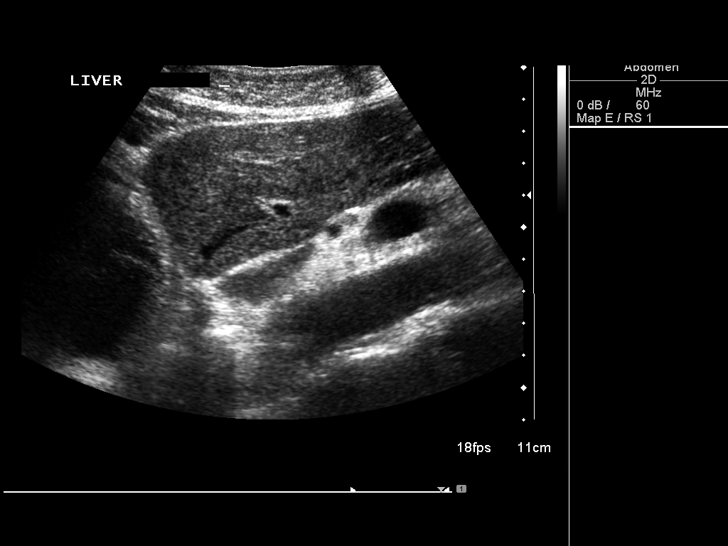
[im 54/216]
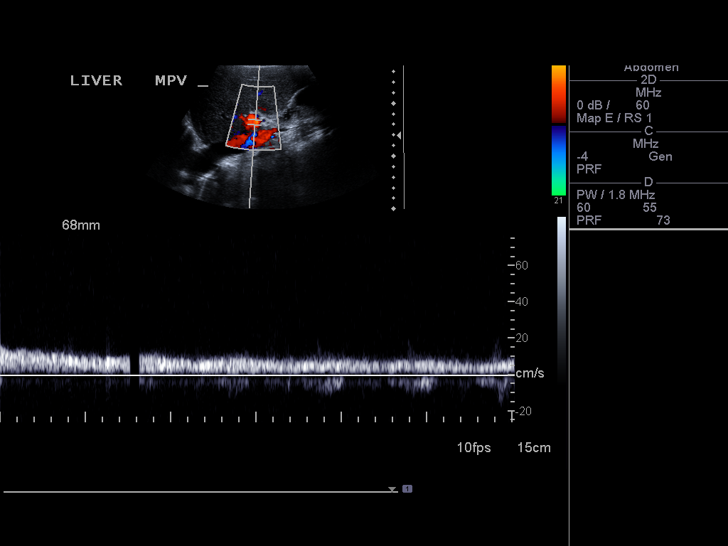
[im 72/216]
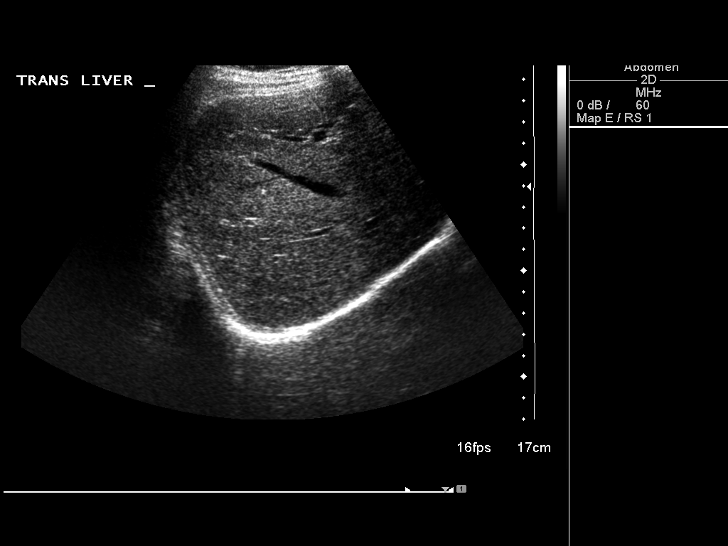
[im 90/216]
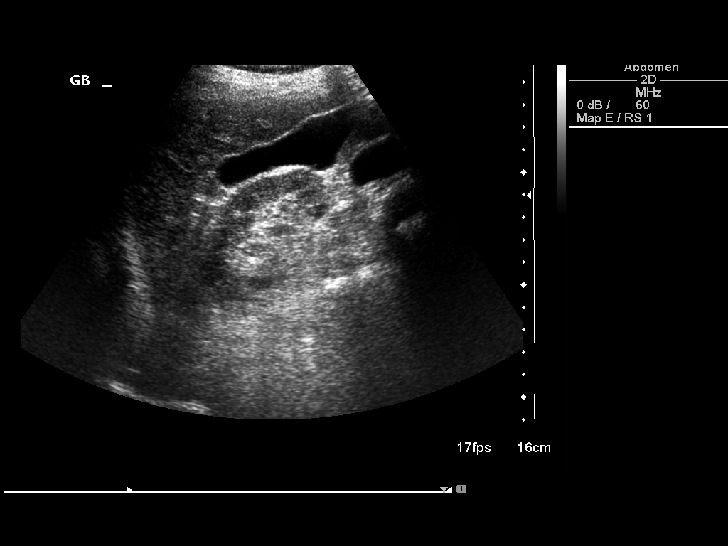
[im 108/216]
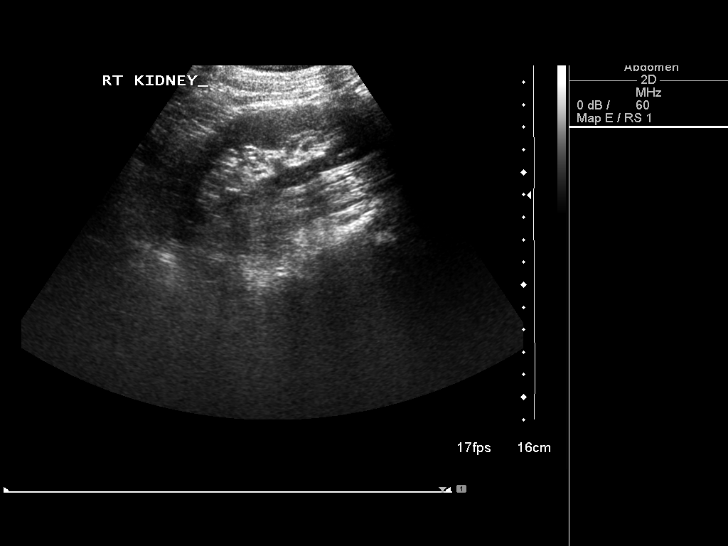
[im 126/216]
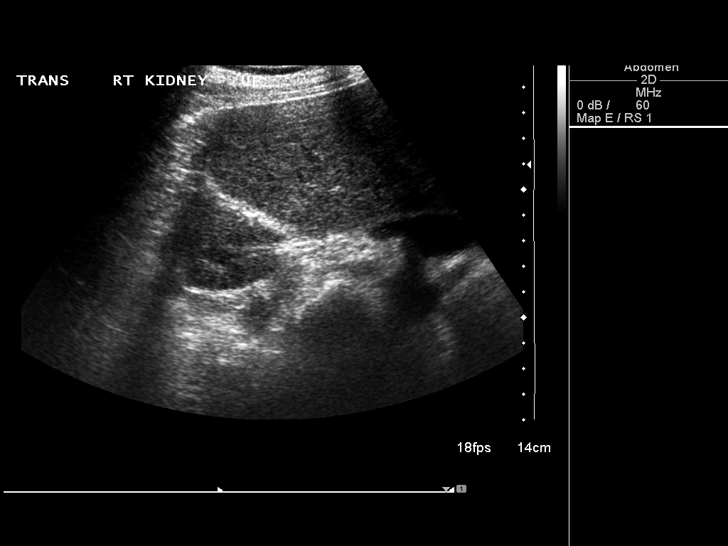
[im 144/216]
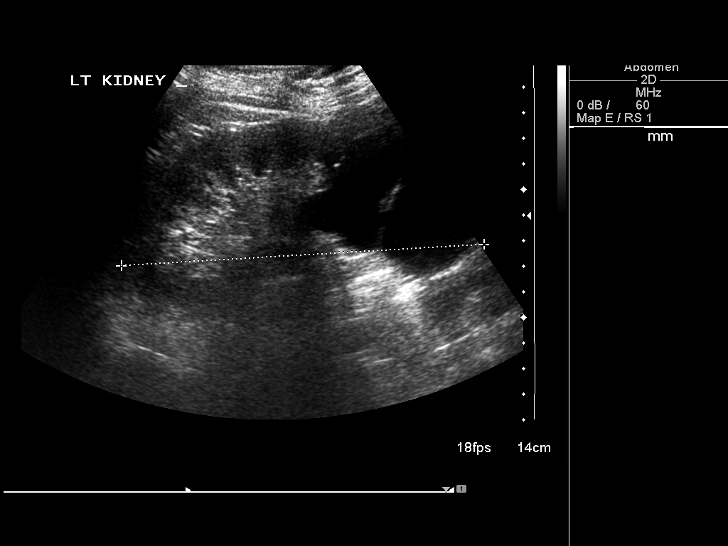
[im 162/216]
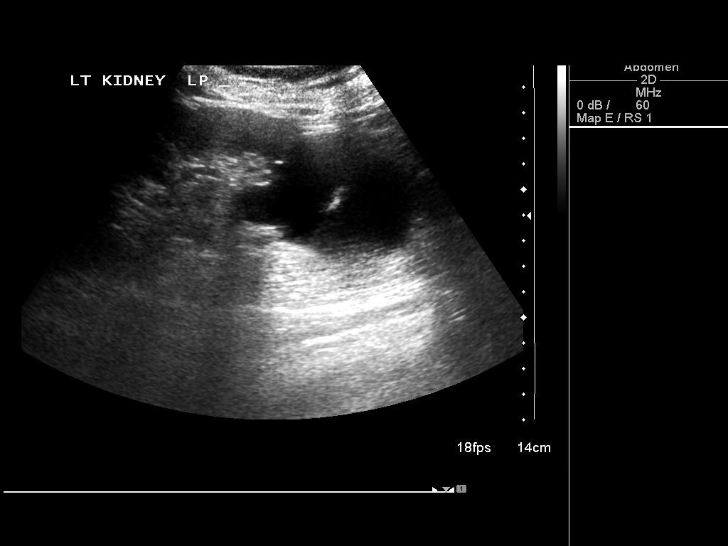
[im 180/216]
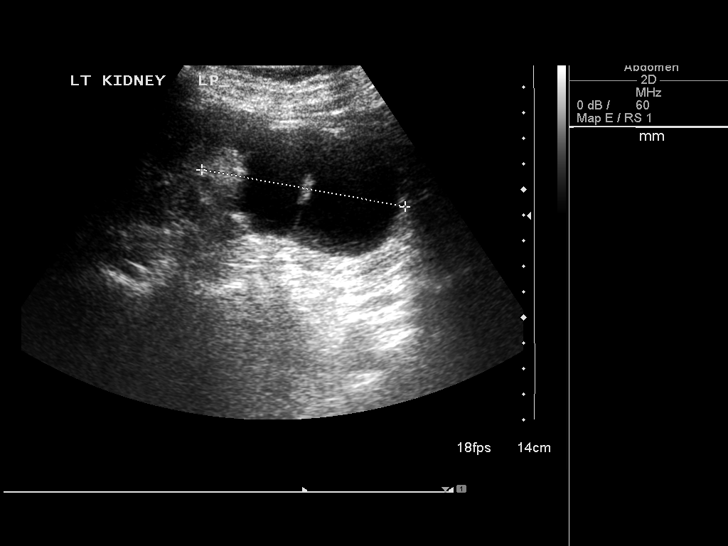
[im 198/216]
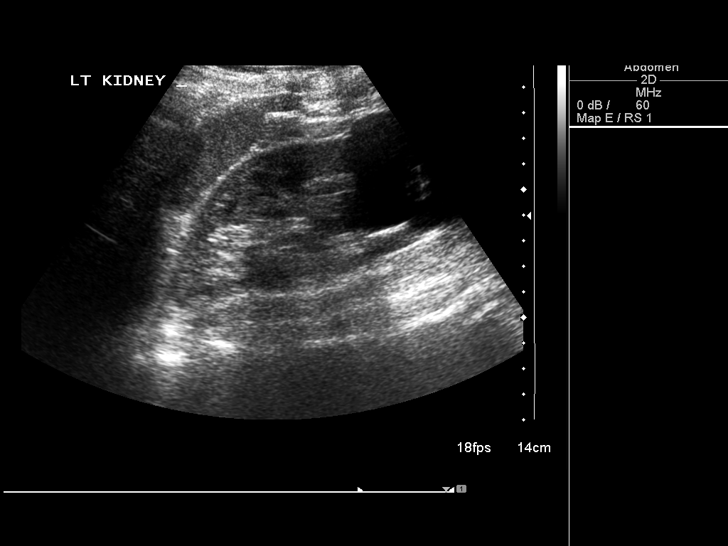
[im 216/216]
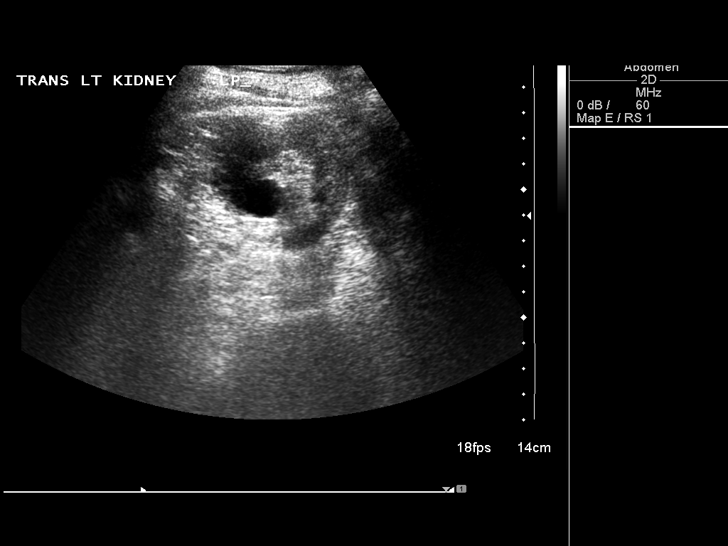

[13 of 25 positions shown; findings below may reference images not displayed]

FINDINGS: Gallbladder: No gallstones or wall thickening visualized. No
sonographic Murphy sign noted by sonographer.

Common bile duct: Diameter: 4 mm

Liver: No focal lesion identified. Within normal limits in
parenchymal echogenicity.

IVC: No abnormality visualized.

Pancreas: Visualized portion unremarkable.

Spleen: Size and appearance within normal limits.

Right Kidney: Length: 11.0 cm. Echogenicity within normal limits. No
mass or hydronephrosis visualized.

Left Kidney: Length: 12.0 cm. No hydronephrosis. Cystic lesion
measures 8.1 x 5.5 x 6.1 cm (previously 6.1 x 6.0 x 4.6 cm). An
internal septation is again seen, however there is increased focal
thickening of this septation, now with a 2 cm nodular soft tissue
focus with internal vascularity.

Abdominal aorta: No aneurysm visualized.

Other findings: None.
IMPRESSION: Enlarging left lower pole cystic renal mass, now with a 2 cm nodular
soft tissue focus associated with an internal septation. Further
characterization with contrast-enhanced abdominal MRI or renal mass
protocol CT is recommended to assess for neoplasm.

## 2017-08-05 ENCOUNTER — Telehealth: Payer: Self-pay | Admitting: Cardiology

## 2017-08-05 NOTE — Telephone Encounter (Signed)
Patient called and stated that she had some symptoms over the Christmas Holiday. She stated that she gets a sensation through her body that ends at her feet, and when it is over she feels really tired and drained. Pt sent remote transmission. Call routed to [place].

## 2017-08-05 NOTE — Telephone Encounter (Signed)
Patient called and described pre-syncopal symptoms that began on Monday with a total of 4 "episodes" as best she could recall. She reports them being extremely brief but is concerned that this is a pacemaker issue. She also discussed a large variety of heal concerns including itchy skin, and "goosebumps". She questioned possible programing changes to help with what she feels are "pauses" causing these pre-syncopal symptoms. She additional reports her and her husband both battling a respiratory virus leaving them both with more unusual fatigue. I reviewed patient's most recent remote transmission explaining that there were no abnormal episodes and that her pacemaker was pacing her ~ 32% of the time. Will send to Dr. Caryl Comes for any additional recommendations. I encouraged patient that pacemaker was working as intended at this time.

## 2017-08-06 NOTE — Telephone Encounter (Signed)
K not sure much to add to your work, but given her level of anxiety it may be oworth an offic visit thx

## 2017-08-07 NOTE — Telephone Encounter (Signed)
Follow up   Patient returning call, got disconnected JG

## 2017-08-10 NOTE — Telephone Encounter (Signed)
Spoke with patient who reported that she had been taking a dietary supplement that also helped treat depression. She reported that the episodes she had been describing felt very much like impending doom and mentioned that a side effect of the supplement she had been taking was increased anxiety attacks. She has since been off the supplement and felt much better. We will continue to monitor her device remotley - patient is aware that she can call to make an appointment earlier than her march OV should she have any additional issues.

## 2017-08-17 DIAGNOSIS — M810 Age-related osteoporosis without current pathological fracture: Secondary | ICD-10-CM | POA: Diagnosis not present

## 2017-08-17 DIAGNOSIS — I10 Essential (primary) hypertension: Secondary | ICD-10-CM | POA: Diagnosis not present

## 2017-08-17 DIAGNOSIS — F325 Major depressive disorder, single episode, in full remission: Secondary | ICD-10-CM | POA: Diagnosis not present

## 2017-08-17 DIAGNOSIS — I480 Paroxysmal atrial fibrillation: Secondary | ICD-10-CM | POA: Diagnosis not present

## 2017-08-18 ENCOUNTER — Encounter: Payer: Medicare Other | Admitting: *Deleted

## 2017-08-21 ENCOUNTER — Telehealth: Payer: Self-pay | Admitting: *Deleted

## 2017-08-21 ENCOUNTER — Ambulatory Visit (INDEPENDENT_AMBULATORY_CARE_PROVIDER_SITE_OTHER): Payer: Medicare Other | Admitting: *Deleted

## 2017-08-21 DIAGNOSIS — I495 Sick sinus syndrome: Secondary | ICD-10-CM

## 2017-08-21 NOTE — Telephone Encounter (Signed)
Spoke with pt, pt stated that she has tried for a couple of days to send a transmission and keeps getting locked out of her app, she has spoken with [employer] has sent several temporary passwords all of which has not worked, pt has gotten several different stories from [employer] each time she has called about what is wrong. Pt has been in contact with Dannial Monarch and has spoken with her about these issues, pt just wanted the office to be aware of these issues.

## 2017-08-21 NOTE — Telephone Encounter (Signed)
Patient calling, states that she is having trouble with heartcare app.Patient states that she spoke with [employer] and was supposed to hear back but she not received any updates as of yet.

## 2017-08-25 NOTE — Progress Notes (Signed)
Remote pacemaker transmission.   

## 2017-08-28 ENCOUNTER — Encounter: Payer: Self-pay | Admitting: Cardiology

## 2017-09-22 LAB — CUP PACEART REMOTE DEVICE CHECK
Brady Statistic AP VS Percent: 48.43 %
Brady Statistic AS VP Percent: 0 %
Brady Statistic AS VS Percent: 51.46 %
Brady Statistic RV Percent Paced: 0.11 %
Implantable Lead Implant Date: 20180709
Implantable Lead Implant Date: 20180709
Implantable Lead Model: 3830
Implantable Lead Model: 5076
Lead Channel Impedance Value: 304 Ohm
Lead Channel Impedance Value: 380 Ohm
Lead Channel Impedance Value: 456 Ohm
Lead Channel Pacing Threshold Amplitude: 0.5 V
Lead Channel Sensing Intrinsic Amplitude: 9.25 mV
Lead Channel Setting Pacing Amplitude: 1.5 V
Lead Channel Setting Pacing Pulse Width: 1 ms
Lead Channel Setting Sensing Sensitivity: 2 mV
MDC IDC LEAD LOCATION: 753859
MDC IDC LEAD LOCATION: 753860
MDC IDC MSMT BATTERY REMAINING LONGEVITY: 166 mo
MDC IDC MSMT BATTERY VOLTAGE: 3.16 V
MDC IDC MSMT LEADCHNL RA IMPEDANCE VALUE: 304 Ohm
MDC IDC MSMT LEADCHNL RA PACING THRESHOLD PULSEWIDTH: 0.4 ms
MDC IDC MSMT LEADCHNL RA SENSING INTR AMPL: 2.375 mV
MDC IDC MSMT LEADCHNL RA SENSING INTR AMPL: 2.375 mV
MDC IDC MSMT LEADCHNL RV SENSING INTR AMPL: 9.25 mV
MDC IDC PG IMPLANT DT: 20180709
MDC IDC SESS DTM: 20190111213530
MDC IDC SET LEADCHNL RV PACING AMPLITUDE: 2.5 V
MDC IDC STAT BRADY AP VP PERCENT: 0.11 %
MDC IDC STAT BRADY RA PERCENT PACED: 48.62 %

## 2017-10-05 ENCOUNTER — Other Ambulatory Visit: Payer: Self-pay | Admitting: Geriatric Medicine

## 2017-10-05 DIAGNOSIS — F411 Generalized anxiety disorder: Secondary | ICD-10-CM | POA: Diagnosis not present

## 2017-10-05 DIAGNOSIS — I10 Essential (primary) hypertension: Secondary | ICD-10-CM | POA: Diagnosis not present

## 2017-10-05 DIAGNOSIS — N281 Cyst of kidney, acquired: Secondary | ICD-10-CM | POA: Diagnosis not present

## 2017-10-05 DIAGNOSIS — I480 Paroxysmal atrial fibrillation: Secondary | ICD-10-CM | POA: Diagnosis not present

## 2017-10-05 DIAGNOSIS — M810 Age-related osteoporosis without current pathological fracture: Secondary | ICD-10-CM | POA: Diagnosis not present

## 2017-10-05 DIAGNOSIS — F325 Major depressive disorder, single episode, in full remission: Secondary | ICD-10-CM | POA: Diagnosis not present

## 2017-10-12 DIAGNOSIS — Z961 Presence of intraocular lens: Secondary | ICD-10-CM | POA: Diagnosis not present

## 2017-10-12 DIAGNOSIS — H35373 Puckering of macula, bilateral: Secondary | ICD-10-CM | POA: Diagnosis not present

## 2017-10-12 DIAGNOSIS — H2512 Age-related nuclear cataract, left eye: Secondary | ICD-10-CM | POA: Diagnosis not present

## 2017-10-15 ENCOUNTER — Ambulatory Visit
Admission: RE | Admit: 2017-10-15 | Discharge: 2017-10-15 | Disposition: A | Discharge status: Home / Self Care | Payer: Medicare Other | Source: Ambulatory Visit | Attending: Geriatric Medicine | Admitting: Geriatric Medicine

## 2017-10-15 DIAGNOSIS — N281 Cyst of kidney, acquired: Secondary | ICD-10-CM

## 2017-10-19 ENCOUNTER — Telehealth: Payer: Self-pay | Admitting: Internal Medicine

## 2017-10-19 NOTE — Telephone Encounter (Signed)
New Message   1. Has your device fired? no  2. Is you device beeping? no  3. Are you experiencing draining or swelling at device site? no  4. Are you calling to see if we received your device transmission? yes  5. Have you passed out? no  Pt states her hr was 44 and would like to speak with a nurse about her transmission  Please route to [place]

## 2017-10-19 NOTE — Telephone Encounter (Signed)
Transmission reviewed- histograms show no HRs < 60bpm. 1 episode of 1:1 SVT 10/06/17. No ectopy noted on transmission. Patient took HR and pulse ox on home pulse oximeter- HR in 40s and O2 in 80s- I advised her that those number seem unlikely and may be related to a bit if irregularity in HR at the time. She has an office visit next week with Dr. Caryl Comes. She is relieved knowing that the transmission shows a history of pacemaker activity.

## 2017-10-26 ENCOUNTER — Other Ambulatory Visit: Payer: Self-pay | Admitting: Physician Assistant

## 2017-10-27 ENCOUNTER — Ambulatory Visit (INDEPENDENT_AMBULATORY_CARE_PROVIDER_SITE_OTHER): Payer: Medicare Other | Admitting: Internal Medicine

## 2017-10-27 ENCOUNTER — Telehealth: Payer: Self-pay | Admitting: Internal Medicine

## 2017-10-27 ENCOUNTER — Encounter: Payer: Self-pay | Admitting: Internal Medicine

## 2017-10-27 VITALS — BP 150/74 | HR 69 | Ht 65.0 in | Wt 157.0 lb

## 2017-10-27 DIAGNOSIS — I495 Sick sinus syndrome: Secondary | ICD-10-CM | POA: Diagnosis not present

## 2017-10-27 DIAGNOSIS — E782 Mixed hyperlipidemia: Secondary | ICD-10-CM | POA: Diagnosis not present

## 2017-10-27 DIAGNOSIS — Z950 Presence of cardiac pacemaker: Secondary | ICD-10-CM | POA: Diagnosis not present

## 2017-10-27 DIAGNOSIS — I480 Paroxysmal atrial fibrillation: Secondary | ICD-10-CM

## 2017-10-27 LAB — CUP PACEART INCLINIC DEVICE CHECK
Battery Remaining Longevity: 164 mo
Brady Statistic AP VP Percent: 0.09 %
Brady Statistic AS VP Percent: 0.01 %
Brady Statistic RA Percent Paced: 41.34 %
Implantable Lead Implant Date: 20180709
Implantable Lead Location: 753859
Implantable Lead Location: 753860
Implantable Lead Model: 5076
Lead Channel Impedance Value: 304 Ohm
Lead Channel Impedance Value: 361 Ohm
Lead Channel Impedance Value: 437 Ohm
Lead Channel Pacing Threshold Amplitude: 0.5 V
Lead Channel Sensing Intrinsic Amplitude: 0.875 mV
Lead Channel Sensing Intrinsic Amplitude: 1.75 mV
Lead Channel Sensing Intrinsic Amplitude: 9.5 mV
Lead Channel Setting Pacing Amplitude: 2.5 V
MDC IDC LEAD IMPLANT DT: 20180709
MDC IDC MSMT BATTERY VOLTAGE: 3.13 V
MDC IDC MSMT LEADCHNL RA PACING THRESHOLD PULSEWIDTH: 0.4 ms
MDC IDC MSMT LEADCHNL RV IMPEDANCE VALUE: 323 Ohm
MDC IDC MSMT LEADCHNL RV SENSING INTR AMPL: 11.5 mV
MDC IDC PG IMPLANT DT: 20180709
MDC IDC SESS DTM: 20190319162647
MDC IDC SET LEADCHNL RA PACING AMPLITUDE: 1.5 V
MDC IDC SET LEADCHNL RV PACING PULSEWIDTH: 1 ms
MDC IDC SET LEADCHNL RV SENSING SENSITIVITY: 2 mV
MDC IDC STAT BRADY AP VS PERCENT: 41.17 %
MDC IDC STAT BRADY AS VS PERCENT: 58.73 %
MDC IDC STAT BRADY RV PERCENT PACED: 0.1 %

## 2017-10-27 NOTE — Telephone Encounter (Signed)
[name] is calling because she didn't have a EKG done today .  She had a pacemaker check today . Please call   Thanks

## 2017-10-27 NOTE — Patient Instructions (Addendum)
Medication Instructions:  Your physician recommends that you continue on your current medications as directed. Please refer to the Current Medication list given to you today.  Labwork: Direct LDL and Lipid Panel  Testing/Procedures: None ordered.  Follow-Up: Your physician recommends that you schedule a follow-up appointment in:  9 months with Dr Caryl Comes  Remote monitoring is used to monitor your Pacemaker from home. This monitoring reduces the number of office visits required to check your device to one time per year. It allows [place] to keep an eye on the functioning of your device to ensure it is working properly. You are scheduled for a device check from home on 11/23/2017. You may send your transmission at any time that day. If you have a wireless device, the transmission will be sent automatically. After your physician reviews your transmission, you will receive a postcard with your next transmission date.    Any Other Special Instructions Will Be Listed Below (If Applicable).     If you need a refill on your cardiac medications before your next appointment, please call your pharmacy.

## 2017-10-27 NOTE — Progress Notes (Signed)
Patient Care Team: Lajean Manes, MD as PCP - General (Internal Medicine)   HPI  Katherine Cardenas is a 77 y.o. female Seen in follow-up for a history of atrial arrhythmia and sinus node dysfunction.s/p Pacer 7/18  The latter had prompted the discontinuation of diltiazem about 4 years ago. When she was in the hospital she was having bradycardia again and was elected to discontinue her propafenone. With that there was normalization of her heart rate.  She had been hospitalized 4/18 for syncope associated with bradycardia   Cardiac catheterization 5/16 demonstrated mild nonobstructive coronary disease  Echocardiogram was normal 4/18   Records and Results Reviewed   Event recorder  atrial fibrillation/flutter at rates of 150. Sinus rates in the 50s. Nocturnal pauses greater than 3 seconds. Durations have been short   She is better with pacing  No SOB or edema  Lots of stress with husband and x daughter in law  On ASA   DATE PR interval QRSduration Propafenone  5/18  140 92 0  3/19 160 96 150   Date TC LDL  7/18 194 105           Thromboembolic risk factors ( age  -2, HTN-1, Gender-1) for a CHADSVASc Score of 4   Past Medical History:  Diagnosis Date  . Anxiety   . Atrial fibrillation ([place])   . Atrial tachycardia ([place])   . Depression   . Facial spasm   . Hypertension   . Tachy-brady syndrome [facility])    a. s/p MDT His Bundle pacemaker implant 2018 - Dr Caryl Comes    Past Surgical History:  Procedure Laterality Date  . APPENDECTOMY     age 67  . CARDIAC CATHETERIZATION N/A 12/15/2014   Procedure: Left Heart Cath and Coronary Angiography;  Surgeon: Burnell Blanks, MD;  Location: [place] [facility];  Service: Cardiovascular;  Laterality: N/A;  . CATARACT EXTRACTION     Right Eye  . DILATION AND CURETTAGE OF UTERUS  1990's   benign polyp  . PACEMAKER IMPLANT N/A 02/16/2017   Procedure: Pacemaker Implant;  Surgeon: Deboraha Sprang, MD;  Location: [place] [facility];   Service: Cardiovascular;  Laterality: N/A;  . TONSILLECTOMY AND ADENOIDECTOMY     --age 66    Current Outpatient Medications  Medication Sig Dispense Refill  . amLODipine (NORVASC) 2.5 MG tablet Take 2.5 mg by mouth daily.    . Ascorbic Acid (VITAMIN C) 1000 MG tablet Take 1,000 mg by mouth daily before supper.     [name] aspirin EC 81 MG tablet Take 2 tablets by mouth daily    . cholecalciferol (VITAMIN D) 1000 UNITS tablet Take 1,000 Units by mouth at bedtime.     . Coenzyme Q10 200 MG capsule Take 200 mg by mouth daily.    [name] diltiazem (CARDIZEM) 30 MG tablet Take 1 tablet (30 mg total) by mouth daily as needed (for heart rate >100).    . Diphenhyd-Calamine-Benzyl Alc (IVAREST POISON IVY ITCH EX) Apply 1 application topically daily as needed (itching).    [name] FINACEA 15 % FOAM Apply 1 application topically daily.    [name] LORazepam (ATIVAN) 1 MG tablet Take 1 mg by mouth daily.     . Magnesium 400 MG CAPS Take 400 mg by mouth daily.    . nitroGLYCERIN (NITROSTAT) 0.4 MG SL tablet Place 0.4 mg under the tongue every 5 (five) minutes x 3 doses as needed for chest pain.    [name]  Omega-3 1000 MG CAPS Take 1,000 mg by mouth daily.    Vladimir Faster Glycol-Propyl Glycol (SYSTANE OP) Place 1 drop into both eyes 2 (two) times daily.    . Probiotic Product (PROBIOTIC COMPLEX ACIDOPHILUS) CAPS Take 1 tablet by mouth daily. "Enzyme Probiotic Complex"    . propafenone (RYTHMOL) 150 MG tablet TAKE 1 TABLET BY MOUTH EVERY 12 HOURS OK TO USE EVERY 8 HOURS AS NEEDED FOR PALPITATIONS 60 tablet 3  . RESVERATROL-GRAPE PO Take 1 tablet by mouth at bedtime.    . Turmeric 500 MG CAPS Take 500 mg by mouth at bedtime.    . vitamin B-12 (CYANOCOBALAMIN) 1000 MCG tablet Take 1,000 mcg by mouth daily.     No current facility-administered medications for this visit.     Allergies  Allergen Reactions  . Augmentin [Amoxicillin-Pot Clavulanate] Hives  . Ciprofloxacin Palpitations and Other (See Comments)    "irregular, rapid  heart beat"  . Diltiazem Other (See Comments)    Pulse dropped too low  . Epinephrine Other (See Comments)    "NOVOCAIN"--could be the epinephrine----patient didn't tolerate-SYNCOPE    . Novocain [Procaine] Other (See Comments)    could be the epinephrine----patient didn't tolerate-SYNCOPE  . Avapro [Irbesartan] Swelling  . Shrimp [Shellfish Allergy] Hives  . Iohexol Itching and Rash     Code: RASH, Desc: patient states hx of possible reaction to ivp contrast years ago   . Penicillins Hives    Has patient had a PCN reaction causing immediate rash, facial/tongue/throat swelling, SOB or lightheadedness with hypotension: Yes Has patient had a PCN reaction causing severe rash involving mucus membranes or skin necrosis: No Has patient had a PCN reaction that required hospitalization No Has patient had a PCN reaction occurring within the last 10 years: No If all of the above answers are "NO", then may proceed with Cephalosporin use.  . Zithromax [Azithromycin] Other (See Comments)    Cannot take with propafenone      Review of Systems negative except from HPI and PMH  Physical Exam BP (!) 150/74   Pulse 69   Ht 5\' 5"  (1.651 m)   Wt 157 lb (71.2 kg)   LMP 08/11/1988 (Approximate)   SpO2 96%   BMI 26.13 kg/m  Well developed and nourished in no acute distress HENT normal Neck supple with JVP-flat Clear Device pocket well healed; without hematoma or erythema.  There is no tethering  Regular rate and rhythm, no murmurs or gallops Abd-soft with active BS No Clubbing cyanosis edema Skin-warm and dry A & Oriented  Grossly normal sensory and motor function    ECG A pacing 66 16/09/39  Assessment and  Plan  Syncope  Atrial fibrillation/tachycardia  Sinus bradycardia  Pacemaker   Medtronic  Hypertension  Hyperlipidemia  Atrial pacing  BP reasonably controlled at home  Device function normal  She would like to have [place] follow her lipids.  We will check it today and  then discuss risk-benefit.  There may be a role for calcium scoring for further risk stratification  We spent more than 50% of our >25 min visit in face to face counseling regarding the above    Current medicines are reviewed at length with the patient today .  The patient does not  have concerns regarding medicines.

## 2017-10-28 LAB — LIPID PANEL
Chol/HDL Ratio: 2.4 ratio (ref 0.0–4.4)
Cholesterol, Total: 205 mg/dL — ABNORMAL HIGH (ref 100–199)
HDL: 84 mg/dL (ref >39)
LDL Calculated: 107 mg/dL — ABNORMAL HIGH (ref 0–99)
Triglycerides: 70 mg/dL (ref 0–149)
VLDL Cholesterol Cal: 14 mg/dL (ref 5–40)

## 2017-10-28 LAB — LDL CHOLESTEROL, DIRECT: LDL DIRECT: 118 mg/dL — AB (ref 0–99)

## 2017-10-28 NOTE — Telephone Encounter (Signed)
Spoke with patient and confirmed she had a 12 lead EKG done during her office visit.

## 2017-10-29 ENCOUNTER — Other Ambulatory Visit: Payer: Self-pay | Admitting: Physician Assistant

## 2017-10-29 DIAGNOSIS — I10 Essential (primary) hypertension: Secondary | ICD-10-CM | POA: Diagnosis not present

## 2017-10-29 DIAGNOSIS — F325 Major depressive disorder, single episode, in full remission: Secondary | ICD-10-CM | POA: Diagnosis not present

## 2017-10-29 DIAGNOSIS — M810 Age-related osteoporosis without current pathological fracture: Secondary | ICD-10-CM | POA: Diagnosis not present

## 2017-10-29 DIAGNOSIS — I480 Paroxysmal atrial fibrillation: Secondary | ICD-10-CM | POA: Diagnosis not present

## 2017-10-29 NOTE — Telephone Encounter (Signed)
Outpatient Medication Detail    Disp Refills Start End   propafenone (RYTHMOL) 150 MG tablet 60 tablet 3 10/28/2017    Sig: TAKE 1 TABLET BY MOUTH EVERY 12 HOURS OK TO USE EVERY 8 HOURS AS NEEDED FOR PALPITATIONS   Sent to pharmacy as: propafenone (RYTHMOL) 150 MG tablet   E-Prescribing Status: Receipt confirmed by pharmacy (10/28/2017 4:31 PM EDT)   Pharmacy   [id number] - [place], [address] AT [address]

## 2017-11-02 ENCOUNTER — Telehealth: Payer: Self-pay | Admitting: Internal Medicine

## 2017-11-02 NOTE — Telephone Encounter (Signed)
New Message:   Pt is stating she needs a written report on her 12 lead EKG she states she doesn't remember getting this done but like a report of this to give to her PCP. Pt states she looked in my chart but it was not available yet.

## 2017-11-03 DIAGNOSIS — N390 Urinary tract infection, site not specified: Secondary | ICD-10-CM | POA: Diagnosis not present

## 2017-11-04 NOTE — Telephone Encounter (Signed)
Pt called back. I let her know that I sent a copy of her EKG to her PCP and read off Dr Olin Pia comment on the EKG as being A Paced at 66bmp. She stated she did not remember having an EKG, but trusted it was done if we have records of it done. She had no additional questions.

## 2017-11-04 NOTE — Telephone Encounter (Signed)
Called pt and was not able to leave message.

## 2017-11-23 ENCOUNTER — Ambulatory Visit (INDEPENDENT_AMBULATORY_CARE_PROVIDER_SITE_OTHER): Payer: Medicare Other | Admitting: *Deleted

## 2017-11-23 DIAGNOSIS — I495 Sick sinus syndrome: Secondary | ICD-10-CM

## 2017-11-24 NOTE — Progress Notes (Signed)
Remote pacemaker transmission.   

## 2017-11-25 ENCOUNTER — Encounter: Payer: Self-pay | Admitting: Cardiology

## 2017-11-25 LAB — CUP PACEART REMOTE DEVICE CHECK
Battery Voltage: 3.12 V
Brady Statistic AP VP Percent: 0.21 %
Brady Statistic AS VP Percent: 0.02 %
Brady Statistic RA Percent Paced: 39.63 %
Brady Statistic RV Percent Paced: 0.23 %
Implantable Lead Implant Date: 20180709
Implantable Lead Implant Date: 20180709
Implantable Lead Location: 753859
Implantable Lead Location: 753860
Implantable Lead Model: 3830
Implantable Pulse Generator Implant Date: 20180709
Lead Channel Impedance Value: 361 Ohm
Lead Channel Impedance Value: 437 Ohm
Lead Channel Pacing Threshold Amplitude: 0.5 V
Lead Channel Pacing Threshold Pulse Width: 0.4 ms
Lead Channel Sensing Intrinsic Amplitude: 1.375 mV
Lead Channel Sensing Intrinsic Amplitude: 9.375 mV
Lead Channel Setting Pacing Amplitude: 2.5 V
Lead Channel Setting Sensing Sensitivity: 2 mV
MDC IDC MSMT BATTERY REMAINING LONGEVITY: 163 mo
MDC IDC MSMT LEADCHNL RA IMPEDANCE VALUE: 304 Ohm
MDC IDC MSMT LEADCHNL RA SENSING INTR AMPL: 1.375 mV
MDC IDC MSMT LEADCHNL RV IMPEDANCE VALUE: 304 Ohm
MDC IDC MSMT LEADCHNL RV SENSING INTR AMPL: 9.375 mV
MDC IDC SESS DTM: 20190415082729
MDC IDC SET LEADCHNL RA PACING AMPLITUDE: 1.5 V
MDC IDC SET LEADCHNL RV PACING PULSEWIDTH: 1 ms
MDC IDC STAT BRADY AP VS PERCENT: 39.31 %
MDC IDC STAT BRADY AS VS PERCENT: 60.46 %

## 2017-12-04 DIAGNOSIS — F411 Generalized anxiety disorder: Secondary | ICD-10-CM | POA: Diagnosis not present

## 2017-12-04 DIAGNOSIS — I480 Paroxysmal atrial fibrillation: Secondary | ICD-10-CM | POA: Diagnosis not present

## 2017-12-04 DIAGNOSIS — I10 Essential (primary) hypertension: Secondary | ICD-10-CM | POA: Diagnosis not present

## 2017-12-04 DIAGNOSIS — E7800 Pure hypercholesterolemia, unspecified: Secondary | ICD-10-CM | POA: Diagnosis not present

## 2017-12-04 DIAGNOSIS — R293 Abnormal posture: Secondary | ICD-10-CM | POA: Diagnosis not present

## 2017-12-04 DIAGNOSIS — M25562 Pain in left knee: Secondary | ICD-10-CM | POA: Diagnosis not present

## 2017-12-07 DIAGNOSIS — E7800 Pure hypercholesterolemia, unspecified: Secondary | ICD-10-CM | POA: Diagnosis not present

## 2018-02-04 DIAGNOSIS — E538 Deficiency of other specified B group vitamins: Secondary | ICD-10-CM | POA: Diagnosis not present

## 2018-02-04 DIAGNOSIS — I480 Paroxysmal atrial fibrillation: Secondary | ICD-10-CM | POA: Diagnosis not present

## 2018-02-04 DIAGNOSIS — F439 Reaction to severe stress, unspecified: Secondary | ICD-10-CM | POA: Diagnosis not present

## 2018-02-04 DIAGNOSIS — I10 Essential (primary) hypertension: Secondary | ICD-10-CM | POA: Diagnosis not present

## 2018-02-22 ENCOUNTER — Ambulatory Visit (INDEPENDENT_AMBULATORY_CARE_PROVIDER_SITE_OTHER): Payer: Medicare Other | Admitting: *Deleted

## 2018-02-22 DIAGNOSIS — I495 Sick sinus syndrome: Secondary | ICD-10-CM | POA: Diagnosis not present

## 2018-02-23 LAB — CUP PACEART REMOTE DEVICE CHECK
Battery Remaining Longevity: 161 mo
Brady Statistic AP VS Percent: 40 %
Brady Statistic AS VP Percent: 0.01 %
Brady Statistic AS VS Percent: 59.88 %
Implantable Lead Location: 753860
Implantable Lead Model: 3830
Implantable Lead Model: 5076
Lead Channel Impedance Value: 285 Ohm
Lead Channel Impedance Value: 304 Ohm
Lead Channel Impedance Value: 399 Ohm
Lead Channel Impedance Value: 418 Ohm
Lead Channel Pacing Threshold Amplitude: 0.5 V
Lead Channel Sensing Intrinsic Amplitude: 1.875 mV
Lead Channel Sensing Intrinsic Amplitude: 9.125 mV
Lead Channel Sensing Intrinsic Amplitude: 9.125 mV
Lead Channel Setting Pacing Amplitude: 1.5 V
Lead Channel Setting Pacing Amplitude: 2.5 V
Lead Channel Setting Pacing Pulse Width: 1 ms
Lead Channel Setting Sensing Sensitivity: 2 mV
MDC IDC LEAD IMPLANT DT: 20180709
MDC IDC LEAD IMPLANT DT: 20180709
MDC IDC LEAD LOCATION: 753859
MDC IDC MSMT BATTERY VOLTAGE: 3.08 V
MDC IDC MSMT LEADCHNL RA PACING THRESHOLD PULSEWIDTH: 0.4 ms
MDC IDC MSMT LEADCHNL RA SENSING INTR AMPL: 1.875 mV
MDC IDC PG IMPLANT DT: 20180709
MDC IDC SESS DTM: 20190715020917
MDC IDC STAT BRADY AP VP PERCENT: 0.12 %
MDC IDC STAT BRADY RA PERCENT PACED: 40.19 %
MDC IDC STAT BRADY RV PERCENT PACED: 0.12 %

## 2018-02-23 NOTE — Progress Notes (Signed)
Remote pacemaker transmission.   

## 2018-02-24 ENCOUNTER — Encounter: Payer: Self-pay | Admitting: Cardiology

## 2018-02-26 ENCOUNTER — Other Ambulatory Visit: Payer: Self-pay | Admitting: Internal Medicine

## 2018-02-26 ENCOUNTER — Telehealth: Payer: Self-pay

## 2018-02-26 NOTE — Telephone Encounter (Signed)
Spoke with pt informed her that I had reviewed the transmission and there were no episodes recorded, lead function was normal and that she was sinus rhythm at time of transmission. Pt stated that she just felt like she had a change of awareness, she felt sick and that the air smelled around her smelled bad. Pt stated that she had an episode in Jan like this and that she followed up with her PCP. Of note pt was not slurring her speech and reported no weakness in either side of her body or drooping of one side of her mouth. Pt also state that she kept getting a disconnected message from her [employer] app but she could re-open the app and it would go away advised pt to call [employer] regarding this issue. Pt voiced appreciation of call and stated that she would contact her PCP regarding these episodes.

## 2018-02-26 NOTE — Telephone Encounter (Signed)
Pt states she feel like she has had some episodes of some sort. She felt like she was going to pass out. Overall not feeling well. I let her talk to device tech nurse.

## 2018-03-14 ENCOUNTER — Encounter (HOSPITAL_COMMUNITY): Payer: Self-pay | Admitting: Emergency Medicine

## 2018-03-14 ENCOUNTER — Other Ambulatory Visit: Payer: Self-pay

## 2018-03-14 ENCOUNTER — Emergency Department (HOSPITAL_COMMUNITY)
Admission: EM | Admit: 2018-03-14 | Discharge: 2018-03-14 | Disposition: A | Discharge status: Home / Self Care | Payer: Medicare Other | Attending: Emergency Medicine | Admitting: Emergency Medicine

## 2018-03-14 DIAGNOSIS — I2510 Atherosclerotic heart disease of native coronary artery without angina pectoris: Secondary | ICD-10-CM | POA: Diagnosis not present

## 2018-03-14 DIAGNOSIS — I10 Essential (primary) hypertension: Secondary | ICD-10-CM | POA: Insufficient documentation

## 2018-03-14 DIAGNOSIS — Z7982 Long term (current) use of aspirin: Secondary | ICD-10-CM | POA: Insufficient documentation

## 2018-03-14 DIAGNOSIS — I83892 Varicose veins of left lower extremities with other complications: Secondary | ICD-10-CM | POA: Diagnosis not present

## 2018-03-14 DIAGNOSIS — E785 Hyperlipidemia, unspecified: Secondary | ICD-10-CM | POA: Insufficient documentation

## 2018-03-14 DIAGNOSIS — Z79899 Other long term (current) drug therapy: Secondary | ICD-10-CM | POA: Insufficient documentation

## 2018-03-14 DIAGNOSIS — R58 Hemorrhage, not elsewhere classified: Secondary | ICD-10-CM | POA: Diagnosis not present

## 2018-03-14 DIAGNOSIS — S81812A Laceration without foreign body, left lower leg, initial encounter: Secondary | ICD-10-CM | POA: Diagnosis not present

## 2018-03-14 DIAGNOSIS — I480 Paroxysmal atrial fibrillation: Secondary | ICD-10-CM | POA: Diagnosis not present

## 2018-03-14 MED ORDER — LIDOCAINE HCL (PF) 1 % IJ SOLN
INTRAMUSCULAR | Status: AC
  Filled 2018-03-14: qty 5

## 2018-03-14 NOTE — ED Triage Notes (Signed)
To ED via [facility] from home, bleeding from varicose vein- left inner ankle- bleeding not stopped by pressure.

## 2018-03-14 NOTE — ED Provider Notes (Signed)
[facility] EMERGENCY DEPARTMENT Provider Note   CSN: [id number] Arrival date & time: 03/14/18  4098     History   Chief Complaint Chief Complaint  Patient presents with  . bleeding from varicose vein    HPI Katherine Cardenas is a 77 y.o. female.  HPI   77 year old female with bleeding varicose vein.  Left lower extremity.  Patient was putting on her footwearwhen she rubbed this area began having a large amount of pulsatile bleeding.  Controllable with pressure but significant rebleeding any time it was let off.  She is not anticoagulated.  Past Medical History:  Diagnosis Date  . Anxiety   . Atrial fibrillation ([place])   . Atrial tachycardia ([place])   . Depression   . Facial spasm   . Hypertension   . Tachy-brady syndrome [place])    a. s/p MDT His Bundle pacemaker implant 2018 - Dr Caryl Comes    Patient Active Problem List   Diagnosis Date Noted  . Syncope 12/07/2016  . Sinus node dysfunction ([place]) 12/07/2016  . Paroxysmal atrial fibrillation ([place]) 12/07/2016  . Hyperlipidemia 01/18/2016  . Ectopic atrial tachycardia ([place]) 02/13/2015  . Essential hypertension 02/13/2015  . Chronic coronary artery disease 12/15/2014    Past Surgical History:  Procedure Laterality Date  . APPENDECTOMY     age 37  . CARDIAC CATHETERIZATION N/A 12/15/2014   Procedure: Left Heart Cath and Coronary Angiography;  Surgeon: Burnell Blanks, MD;  Location: [place] [facility];  Service: Cardiovascular;  Laterality: N/A;  . CATARACT EXTRACTION     Right Eye  . DILATION AND CURETTAGE OF UTERUS  1990's   benign polyp  . PACEMAKER IMPLANT N/A 02/16/2017   Procedure: Pacemaker Implant;  Surgeon: Deboraha Sprang, MD;  Location: [place] [facility];  Service: Cardiovascular;  Laterality: N/A;  . TONSILLECTOMY AND ADENOIDECTOMY     --age 40     OB History    Gravida  3   Para  3   Term  3   Preterm      AB      Living  3     SAB      TAB      Ectopic      Multiple        Live Births               Home Medications    Prior to Admission medications   Medication Sig Start Date End Date Taking? Authorizing Provider  amLODipine (NORVASC) 2.5 MG tablet Take 2.5 mg by mouth daily. 11/11/16   [provider]  Ascorbic Acid (VITAMIN C) 1000 MG tablet Take 1,000 mg by mouth daily before supper.     [provider]  aspirin EC 81 MG tablet Take 2 tablets by mouth daily    [provider]  cholecalciferol (VITAMIN D) 1000 UNITS tablet Take 1,000 Units by mouth at bedtime.     [provider]  Coenzyme Q10 200 MG capsule Take 200 mg by mouth daily.    [provider]  diltiazem (CARDIZEM) 30 MG tablet Take 1 tablet (30 mg total) by mouth daily as needed (for heart rate >100). 04/10/17   Chanetta Marshall K, NP  Diphenhyd-Calamine-Benzyl Alc (IVAREST POISON IVY ITCH EX) Apply 1 application topically daily as needed (itching).    [provider]  FINACEA 15 % FOAM Apply 1 application topically daily. 03/24/17   [provider]  LORazepam (ATIVAN) 1 MG  tablet Take 1 mg by mouth daily.     [provider]  Magnesium 400 MG CAPS Take 400 mg by mouth daily.    [provider]  nitroGLYCERIN (NITROSTAT) 0.4 MG SL tablet Place 0.4 mg under the tongue every 5 (five) minutes x 3 doses as needed for chest pain.    [provider]  Omega-3 1000 MG CAPS Take 1,000 mg by mouth daily.    [provider]  Polyethyl Glycol-Propyl Glycol (SYSTANE OP) Place 1 drop into both eyes 2 (two) times daily.    [provider]  Probiotic Product (PROBIOTIC COMPLEX ACIDOPHILUS) CAPS Take 1 tablet by mouth daily. "Enzyme Probiotic Complex"    [provider]  propafenone (RYTHMOL) 150 MG tablet TAKE 1 TABLET BY MOUTH EVERY 12 HOURS OK TO USE EVERY 8 HOURS AS NEEDED FOR PALPITATIONS 03/01/18   Deboraha Sprang, MD  RESVERATROL-GRAPE PO Take 1 tablet by mouth at bedtime.    [provider]  Turmeric 500 MG CAPS Take 500 mg by mouth at bedtime.    [provider]  vitamin B-12 (CYANOCOBALAMIN) 1000 MCG tablet Take 1,000 mcg by mouth daily.    [provider]    Family History Family History  Problem Relation Age of Onset  . Stomach cancer Father 38  . Hypertension Mother   . Renal Disease Mother 63  . Kidney failure Mother   . Hypertension Maternal Grandmother   . Heart attack Maternal Grandmother     Social History Social History   Tobacco Use  . Smoking status: Never Smoker  . Smokeless tobacco: Never Used  Substance Use Topics  . Alcohol use: Yes    Alcohol/week: 8.4 oz    Types: 14 Glasses of wine per week  . Drug use: No     Allergies   Augmentin [amoxicillin-pot clavulanate]; Ciprofloxacin; Diltiazem; Epinephrine; Novocain [procaine]; Avapro [irbesartan]; Shrimp [shellfish allergy]; Iohexol; Penicillins; and Zithromax [azithromycin]   Review of Systems Review of Systems  All systems reviewed and negative, other than as noted in HPI.  Physical Exam Updated Vital Signs BP (!) 175/87   Pulse 70   Temp 98.5 F (36.9 C) (Oral)   Resp 16   Wt 71.2 kg (157 lb)   LMP 08/11/1988 (Approximate)   SpO2 100%   BMI 26.13 kg/m   Physical Exam  Constitutional: She appears well-developed and well-nourished. No distress.  HENT:  Head: Normocephalic and atraumatic.  Eyes: Conjunctivae are normal. Right eye exhibits no discharge. Left eye exhibits no discharge.  Neck: Neck supple.  Cardiovascular: Normal rate, regular rhythm and normal heart sounds. Exam reveals no gallop and no friction rub.  No murmur heard. Pulmonary/Chest: Effort normal and breath sounds normal. No respiratory distress.  Abdominal: Soft. She exhibits no distension. There is no tenderness.  Musculoskeletal: She exhibits no edema or tenderness.  Neurological: She is alert.  Skin: Skin is warm and dry.  Dark pulsatile bleeding from punctate area just  proximal to L medial ankle.   Psychiatric: She has a normal mood and affect. Her behavior is normal. Thought content normal.  Nursing note and vitals reviewed.    ED Treatments / Results  Labs (all labs ordered are listed, but only abnormal results are displayed) Labs Reviewed - No data to display  EKG None  Radiology No results found.  Procedures Procedures (including critical care time)  LACERATION REPAIR Performed by: Virgel Manifold Authorized by: Virgel Manifold Consent: Verbal consent obtained. Risks and benefits:  risks, benefits and alternatives were discussed Consent given by: patient Patient identity confirmed: provided demographic data Prepped and Draped in normal sterile fashion Wound explored  Laceration Location:  LLE  Laceration Length: < 1cm  No Foreign Bodies seen or palpated  Anesthesia: local infiltration  Local anesthetic: lidocaine 1% Anesthetic total: 1 ml  Irrigation method: syringe Amount of cleaning: standard  Skin closure: 4-0 vicryl  Number of sutures: 1  Technique: figure-of-eight  Patient tolerance: Patient tolerated the procedure well with no immediate complications.   Medications Ordered in ED Medications  lidocaine (PF) (XYLOCAINE) 1 % injection (has no administration in time range)     Initial Impression / Assessment and Plan / ED Course  I have reviewed the triage vital signs and the nursing notes.  Pertinent labs & imaging results that were available during my care of the patient were reviewed by me and considered in my medical decision making (see chart for details).     77yF with bleeding superficial varicose vein. Hemostasis with figure-of-eight suture. Continued wound care and return precautions discussed.   Final Clinical Impressions(s) / ED Diagnoses   Final diagnoses:  Bleeding from varicose veins of left lower extremity    ED Discharge Orders    None       Virgel Manifold, MD 03/17/18 1600

## 2018-03-14 NOTE — Discharge Instructions (Signed)
You can clean the area with mild soap/warm water. Do not scrub. The suture is absorbable. It should "fall" off in 1-2 weeks. If it is still present beyond that you can have it removed. You can continue your normal activities.

## 2018-03-23 DIAGNOSIS — I8391 Asymptomatic varicose veins of right lower extremity: Secondary | ICD-10-CM | POA: Diagnosis not present

## 2018-04-14 DIAGNOSIS — H35373 Puckering of macula, bilateral: Secondary | ICD-10-CM | POA: Diagnosis not present

## 2018-04-14 DIAGNOSIS — H2512 Age-related nuclear cataract, left eye: Secondary | ICD-10-CM | POA: Diagnosis not present

## 2018-04-14 DIAGNOSIS — Z961 Presence of intraocular lens: Secondary | ICD-10-CM | POA: Diagnosis not present

## 2018-04-14 DIAGNOSIS — H40013 Open angle with borderline findings, low risk, bilateral: Secondary | ICD-10-CM | POA: Diagnosis not present

## 2018-04-19 DIAGNOSIS — I83812 Varicose veins of left lower extremities with pain: Secondary | ICD-10-CM | POA: Diagnosis not present

## 2018-04-30 DIAGNOSIS — I83812 Varicose veins of left lower extremities with pain: Secondary | ICD-10-CM | POA: Diagnosis not present

## 2018-04-30 DIAGNOSIS — F325 Major depressive disorder, single episode, in full remission: Secondary | ICD-10-CM | POA: Diagnosis not present

## 2018-04-30 DIAGNOSIS — R269 Unspecified abnormalities of gait and mobility: Secondary | ICD-10-CM | POA: Diagnosis not present

## 2018-04-30 DIAGNOSIS — I10 Essential (primary) hypertension: Secondary | ICD-10-CM | POA: Diagnosis not present

## 2018-04-30 DIAGNOSIS — Z1389 Encounter for screening for other disorder: Secondary | ICD-10-CM | POA: Diagnosis not present

## 2018-04-30 DIAGNOSIS — Z0000 Encounter for general adult medical examination without abnormal findings: Secondary | ICD-10-CM | POA: Diagnosis not present

## 2018-05-06 ENCOUNTER — Encounter: Payer: Self-pay | Admitting: Internal Medicine

## 2018-05-07 ENCOUNTER — Encounter: Payer: Medicare Other | Admitting: Rehabilitative and Restorative Service Providers"

## 2018-05-24 ENCOUNTER — Ambulatory Visit (INDEPENDENT_AMBULATORY_CARE_PROVIDER_SITE_OTHER): Payer: Medicare Other | Admitting: *Deleted

## 2018-05-24 DIAGNOSIS — I495 Sick sinus syndrome: Secondary | ICD-10-CM

## 2018-05-25 ENCOUNTER — Encounter: Payer: Self-pay | Admitting: Internal Medicine

## 2018-05-25 ENCOUNTER — Ambulatory Visit (INDEPENDENT_AMBULATORY_CARE_PROVIDER_SITE_OTHER): Payer: Medicare Other | Admitting: Internal Medicine

## 2018-05-25 VITALS — BP 132/88 | HR 67 | Ht 65.0 in | Wt 149.8 lb

## 2018-05-25 DIAGNOSIS — R001 Bradycardia, unspecified: Secondary | ICD-10-CM | POA: Diagnosis not present

## 2018-05-25 DIAGNOSIS — R55 Syncope and collapse: Secondary | ICD-10-CM

## 2018-05-25 DIAGNOSIS — I495 Sick sinus syndrome: Secondary | ICD-10-CM

## 2018-05-25 DIAGNOSIS — Z950 Presence of cardiac pacemaker: Secondary | ICD-10-CM

## 2018-05-25 DIAGNOSIS — I480 Paroxysmal atrial fibrillation: Secondary | ICD-10-CM | POA: Diagnosis not present

## 2018-05-25 MED ORDER — DILTIAZEM HCL 30 MG PO TABS: 30.0000 mg | ORAL_TABLET | Freq: Every day | ORAL | 2 refills | Status: DC | PRN

## 2018-05-25 MED ORDER — NITROGLYCERIN 0.4 MG SL SUBL: 0.4000 mg | SUBLINGUAL_TABLET | SUBLINGUAL | 3 refills | Status: AC | PRN

## 2018-05-25 NOTE — Progress Notes (Signed)
Patient Care Team: Lajean Manes, MD as PCP - General (Internal Medicine)   HPI  Katherine Cardenas is a 77 y.o. female Seen in follow-up for a history of atrial arrhythmia and sinus node dysfunction.s/p Pacer 7/18  The latter had prompted the discontinuation of diltiazem about 4 years ago. When she was in the hospital she was having bradycardia again and was elected to discontinue her propafenone. With that there was normalization of her heart rate.  She had been hospitalized 4/18 for syncope associated with bradycardia   Cardiac catheterization 5/16 demonstrated mild nonobstructive coronary disease  Echocardiogram was normal 4/18   Records and Results Reviewed   Event recorder  atrial fibrillation/flutter at rates of 150. Sinus rates in the 50s. Nocturnal pauses greater than 3 seconds. Durations have been short   Lots of stress with husband's status is stable  No interval atrial arrhythmias which she is aware.  Exercise tolerance is quite good and perhaps improved.  Takes low-dose aspirin     DATE PR interval QRSduration Propafenone  5/18  140 92 0  3/19 160 96 150  10/19 Ap 148 96 150 (2x)   Date TC LDL  7/18 194 105           Thromboembolic risk factors ( age  -2, HTN-1, Gender-1) for a CHADSVASc Score of 4   Past Medical History:  Diagnosis Date  . Anxiety   . Atrial fibrillation ([place])   . Atrial tachycardia ([place])   . Depression   . Facial spasm   . Hypertension   . Tachy-brady syndrome [facility])    a. s/p MDT His Bundle pacemaker implant 2018 - Dr Caryl Comes    Past Surgical History:  Procedure Laterality Date  . APPENDECTOMY     age 24  . CARDIAC CATHETERIZATION N/A 12/15/2014   Procedure: Left Heart Cath and Coronary Angiography;  Surgeon: Burnell Blanks, MD;  Location: [place] [facility];  Service: Cardiovascular;  Laterality: N/A;  . CATARACT EXTRACTION     Right Eye  . DILATION AND CURETTAGE OF UTERUS  1990's   benign polyp  . PACEMAKER IMPLANT  N/A 02/16/2017   Procedure: Pacemaker Implant;  Surgeon: Deboraha Sprang, MD;  Location: [place] [facility];  Service: Cardiovascular;  Laterality: N/A;  . TONSILLECTOMY AND ADENOIDECTOMY     --age 67    Current Outpatient Medications  Medication Sig Dispense Refill  . amLODipine (NORVASC) 2.5 MG tablet Take 2.5 mg by mouth daily.    . Ascorbic Acid (VITAMIN C) 1000 MG tablet Take 1,000 mg by mouth daily before supper.     [name] aspirin EC 81 MG tablet Take 81 mg by mouth daily.    . cholecalciferol (VITAMIN D) 1000 UNITS tablet Take 1,000 Units by mouth at bedtime.     . Coenzyme Q10 200 MG capsule Take 200 mg by mouth daily.    [name] diltiazem (CARDIZEM) 30 MG tablet Take 1 tablet (30 mg total) by mouth daily as needed (for heart rate >100). 30 tablet 2  . Diphenhyd-Calamine-Benzyl Alc (IVAREST POISON IVY ITCH EX) Apply 1 application topically daily as needed (itching).    [name] FINACEA 15 % FOAM Apply 1 application topically daily.    [name] LORazepam (ATIVAN) 1 MG tablet Take 1 mg by mouth daily.     . Magnesium 400 MG CAPS Take 400 mg by mouth daily.    . nitroGLYCERIN (NITROSTAT) 0.4 MG SL tablet Place 1 tablet (0.4 mg  total) under the tongue every 5 (five) minutes x 3 doses as needed for chest pain. 25 tablet 3  . Omega-3 1000 MG CAPS Take 1,000 mg by mouth daily.    Vladimir Faster Glycol-Propyl Glycol (SYSTANE OP) Place 1 drop into both eyes 2 (two) times daily.    . Probiotic Product (PROBIOTIC COMPLEX ACIDOPHILUS) CAPS Take 1 tablet by mouth daily. "Enzyme Probiotic Complex"    . propafenone (RYTHMOL) 150 MG tablet TAKE 1 TABLET BY MOUTH EVERY 12 HOURS OK TO USE EVERY 8 HOURS AS NEEDED FOR PALPITATIONS 60 tablet 9  . Turmeric 500 MG CAPS Take 500 mg by mouth at bedtime.    . vitamin B-12 (CYANOCOBALAMIN) 1000 MCG tablet Take 1,000 mcg by mouth every other day.     No current facility-administered medications for this visit.     Allergies  Allergen Reactions  . Augmentin [Amoxicillin-Pot  Clavulanate] Hives  . Ciprofloxacin Palpitations and Other (See Comments)    "irregular, rapid heart beat"  . Diltiazem Other (See Comments)    Pulse dropped too low  . Epinephrine Other (See Comments)    "NOVOCAIN"--could be the epinephrine----patient didn't tolerate-SYNCOPE    . Novocain [Procaine] Other (See Comments)    could be the epinephrine----patient didn't tolerate-SYNCOPE  . Avapro [Irbesartan] Swelling  . Shrimp [Shellfish Allergy] Hives  . Iohexol Itching and Rash     Code: RASH, Desc: patient states hx of possible reaction to ivp contrast years ago   . Penicillins Hives    Has patient had a PCN reaction causing immediate rash, facial/tongue/throat swelling, SOB or lightheadedness with hypotension: Yes Has patient had a PCN reaction causing severe rash involving mucus membranes or skin necrosis: No Has patient had a PCN reaction that required hospitalization No Has patient had a PCN reaction occurring within the last 10 years: No If all of the above answers are "NO", then may proceed with Cephalosporin use.  . Zithromax [Azithromycin] Other (See Comments)    Cannot take with propafenone      Review of Systems negative except from HPI and PMH  Physical Exam BP 132/88   Pulse 67   Ht 5\' 5"  (1.651 m)   Wt 149 lb 12.8 oz (67.9 kg)   LMP 08/11/1988 (Approximate)   SpO2 98%   BMI 24.93 kg/m  Well developed and nourished in no acute distress HENT normal Neck supple with JVP-flat Clear Regular rate and rhythm, no murmurs or gallops Abd-soft with active BS No Clubbing cyanosis edema Skin-warm and dry A & Oriented  Grossly normal sensory and motor function   ECG  apacing 67 15/10/40  Assessment and  Plan  Syncope  Atrial fibrillation/tachycardia  SCAF  Sinus bradycardia  Pacemaker   [employer]tronic  Hypertension  Hyperlipidemia    No interval syncope  No interval atrial fibrillation  Blood pressure reasonably controlled  She continues to take  low-dose aspirin for her arthritis   Current medicines are reviewed at length with the patient today .  The patient does not  have concerns regarding medicines.

## 2018-05-25 NOTE — Progress Notes (Signed)
Remote pacemaker transmission.   

## 2018-05-25 NOTE — Patient Instructions (Signed)
Medication Instructions:  Your physician recommends that you continue on your current medications as directed. Please refer to the Current Medication list given to you today. If you need a refill on your cardiac medications before your next appointment, please call your pharmacy.   Lab work: None ordered. If you have labs (blood work) drawn today and your tests are completely normal, you will receive your results only by: [name] MyChart Message (if you have MyChart) OR . A paper copy in the mail If you have any lab test that is abnormal or we need to change your treatment, we will call you to review the results.  Testing/Procedures: None ordered.  Follow-Up: At [facility], you and your health needs are our priority.  As part of our continuing mission to provide you with exceptional heart care, we have created designated Provider Care Teams.  These Care Teams include your primary Cardiologist (physician) and Advanced Practice Providers (APPs -  Physician Assistants and Nurse Practitioners) who all work together to provide you with the care you need, when you need it. You will need a follow up appointment in 12 months.  Please call our office 2 months in advance to schedule this appointment.  You may see Dr Caryl Comes or one of the following Advanced Practice Providers on your designated Care Team:   Chanetta Marshall, NP . Tommye Standard, PA-C  Any Other Special Instructions Will Be Listed Below (If Applicable).

## 2018-05-27 ENCOUNTER — Encounter: Payer: Self-pay | Admitting: Cardiology

## 2018-05-27 DIAGNOSIS — Z23 Encounter for immunization: Secondary | ICD-10-CM | POA: Diagnosis not present

## 2018-05-31 LAB — CUP PACEART INCLINIC DEVICE CHECK
Brady Statistic AP VP Percent: 0.17 %
Brady Statistic AP VS Percent: 43.73 %
Brady Statistic AS VP Percent: 0.01 %
Date Time Interrogation Session: 20191015181808
Implantable Lead Location: 753860
Implantable Lead Model: 3830
Implantable Lead Model: 5076
Lead Channel Impedance Value: 285 Ohm
Lead Channel Impedance Value: 304 Ohm
Lead Channel Impedance Value: 361 Ohm
Lead Channel Pacing Threshold Pulse Width: 0.4 ms
Lead Channel Pacing Threshold Pulse Width: 1 ms
Lead Channel Sensing Intrinsic Amplitude: 0.875 mV
Lead Channel Sensing Intrinsic Amplitude: 11.25 mV
Lead Channel Sensing Intrinsic Amplitude: 8.625 mV
Lead Channel Setting Pacing Amplitude: 1.5 V
Lead Channel Setting Pacing Amplitude: 2.5 V
Lead Channel Setting Pacing Pulse Width: 1 ms
MDC IDC LEAD IMPLANT DT: 20180709
MDC IDC LEAD IMPLANT DT: 20180709
MDC IDC LEAD LOCATION: 753859
MDC IDC MSMT BATTERY REMAINING LONGEVITY: 157 mo
MDC IDC MSMT BATTERY VOLTAGE: 3.05 V
MDC IDC MSMT LEADCHNL RA PACING THRESHOLD AMPLITUDE: 0.75 V
MDC IDC MSMT LEADCHNL RA SENSING INTR AMPL: 1.625 mV
MDC IDC MSMT LEADCHNL RV IMPEDANCE VALUE: 418 Ohm
MDC IDC MSMT LEADCHNL RV PACING THRESHOLD AMPLITUDE: 0.75 V
MDC IDC PG IMPLANT DT: 20180709
MDC IDC SET LEADCHNL RV SENSING SENSITIVITY: 2 mV
MDC IDC STAT BRADY AS VS PERCENT: 56.1 %
MDC IDC STAT BRADY RA PERCENT PACED: 43.98 %
MDC IDC STAT BRADY RV PERCENT PACED: 0.18 %

## 2018-06-04 ENCOUNTER — Telehealth: Payer: Self-pay | Admitting: Internal Medicine

## 2018-06-04 NOTE — Telephone Encounter (Signed)
Subject completed annual survey for [employer] study per via phone. Subject answered various of question that was related to [place] and experience .Subject met inclusion and exclusion criteria.

## 2018-06-30 DIAGNOSIS — I83812 Varicose veins of left lower extremities with pain: Secondary | ICD-10-CM | POA: Diagnosis not present

## 2018-06-30 DIAGNOSIS — R413 Other amnesia: Secondary | ICD-10-CM | POA: Diagnosis not present

## 2018-06-30 DIAGNOSIS — I10 Essential (primary) hypertension: Secondary | ICD-10-CM | POA: Diagnosis not present

## 2018-06-30 DIAGNOSIS — I480 Paroxysmal atrial fibrillation: Secondary | ICD-10-CM | POA: Diagnosis not present

## 2018-06-30 IMAGING — DX DG CHEST 2V
2 series · 2 of 2 positions shown · non-contrast
Comparison: 11/14/2015.

CLINICAL DATA: Back pain.

EXAM:
CHEST  2 VIEW

[chest pa]
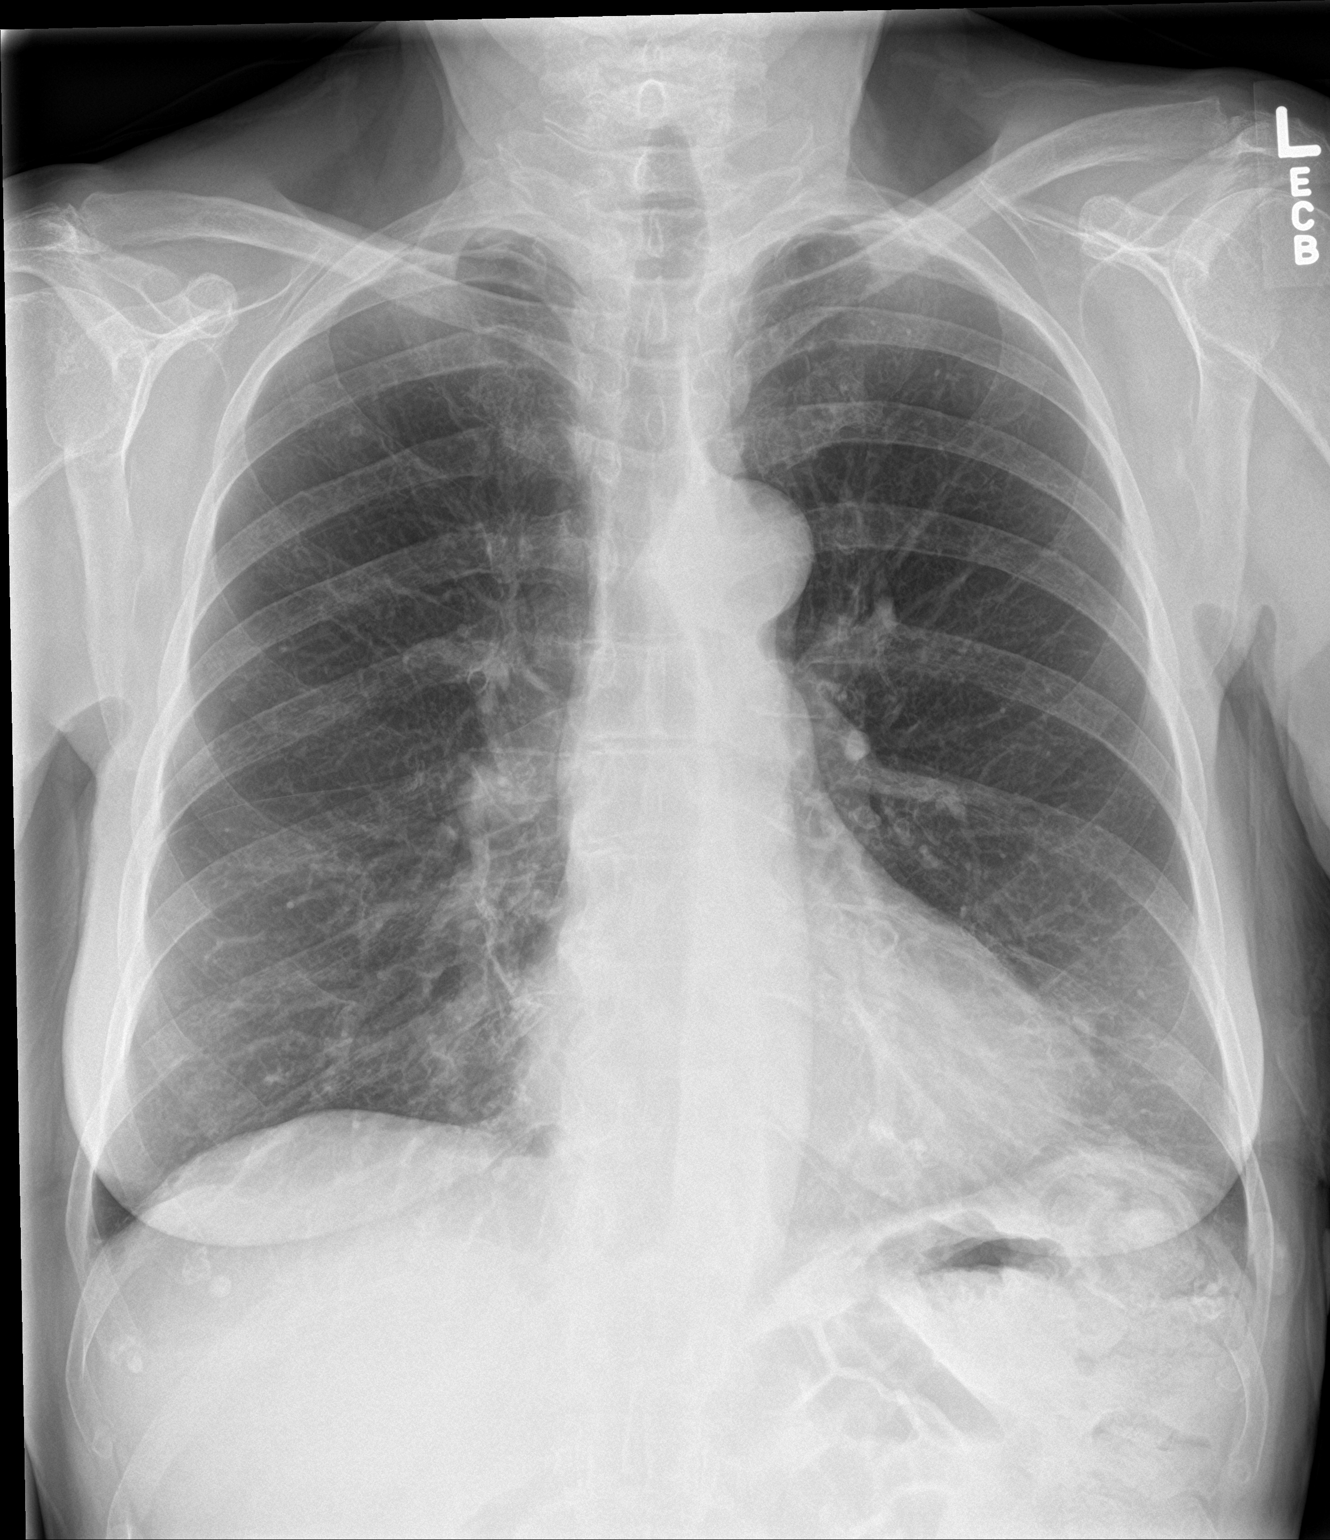

[chest lat]
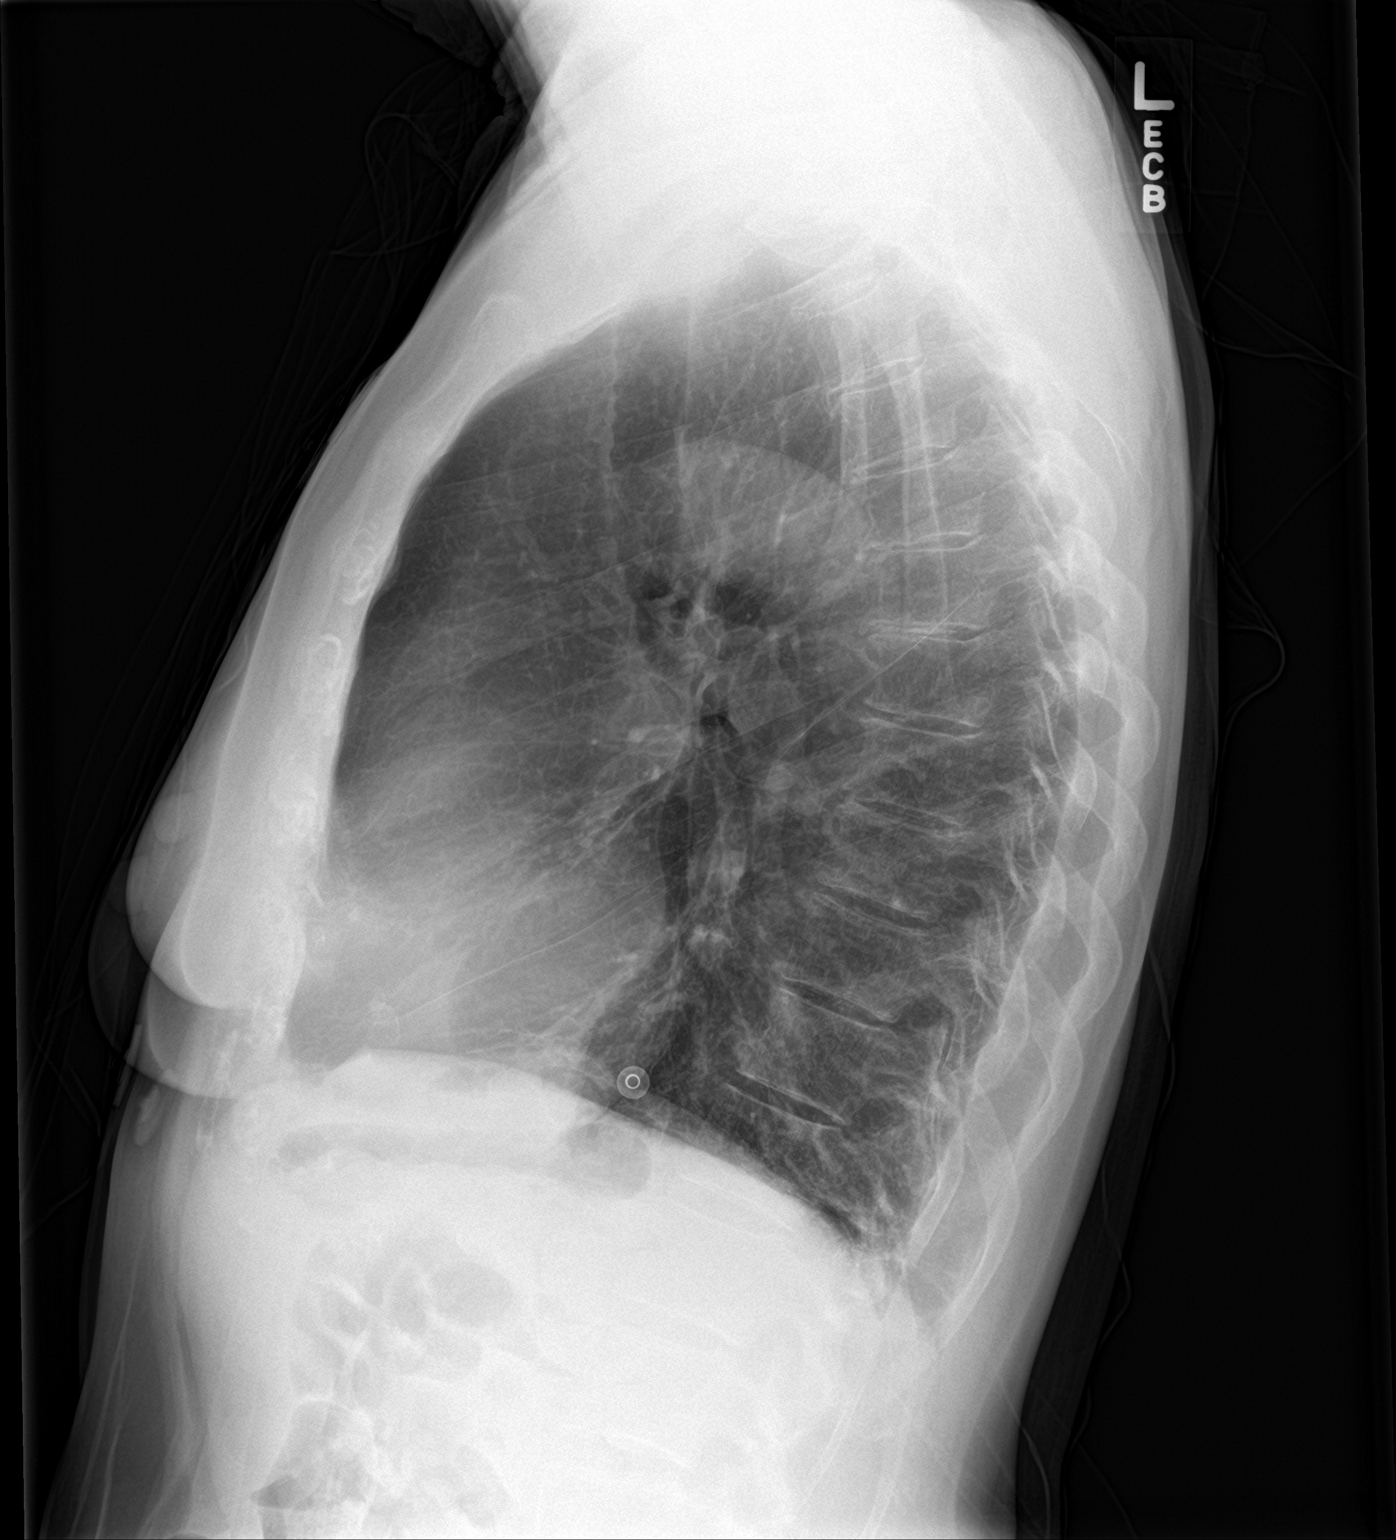

[2 of 2 positions shown; findings below may reference images not displayed]

FINDINGS: Mediastinum and hilar structures normal. Cardiomegaly with normal
pulmonary vascularity. Mild basilar subsegmental atelectasis No
pleural effusion or pneumothorax. No acute bony abnormality.
IMPRESSION: 1.  Mild basilar subsegmental atelectasis.

2.  Stable mild cardiomegaly.  No pulmonary venous congestion.

## 2018-07-12 DIAGNOSIS — N390 Urinary tract infection, site not specified: Secondary | ICD-10-CM | POA: Diagnosis not present

## 2018-07-28 LAB — CUP PACEART REMOTE DEVICE CHECK
Battery Remaining Longevity: 157 mo
Battery Voltage: 3.05 V
Brady Statistic AP VP Percent: 0.2 %
Brady Statistic RA Percent Paced: 48.86 %
Brady Statistic RV Percent Paced: 0.21 %
Implantable Lead Implant Date: 20180709
Implantable Lead Implant Date: 20180709
Implantable Lead Location: 753860
Implantable Lead Model: 3830
Implantable Pulse Generator Implant Date: 20180709
Lead Channel Impedance Value: 285 Ohm
Lead Channel Impedance Value: 361 Ohm
Lead Channel Impedance Value: 418 Ohm
Lead Channel Pacing Threshold Amplitude: 0.625 V
Lead Channel Sensing Intrinsic Amplitude: 2.125 mV
Lead Channel Sensing Intrinsic Amplitude: 2.125 mV
Lead Channel Sensing Intrinsic Amplitude: 9.25 mV
Lead Channel Setting Sensing Sensitivity: 2 mV
MDC IDC LEAD LOCATION: 753859
MDC IDC MSMT LEADCHNL RA PACING THRESHOLD PULSEWIDTH: 0.4 ms
MDC IDC MSMT LEADCHNL RV IMPEDANCE VALUE: 304 Ohm
MDC IDC MSMT LEADCHNL RV SENSING INTR AMPL: 9.25 mV
MDC IDC SESS DTM: 20191014003716
MDC IDC SET LEADCHNL RA PACING AMPLITUDE: 1.5 V
MDC IDC SET LEADCHNL RV PACING AMPLITUDE: 2.5 V
MDC IDC SET LEADCHNL RV PACING PULSEWIDTH: 1 ms
MDC IDC STAT BRADY AP VS PERCENT: 48.58 %
MDC IDC STAT BRADY AS VP PERCENT: 0.01 %
MDC IDC STAT BRADY AS VS PERCENT: 51.22 %

## 2018-08-13 ENCOUNTER — Encounter (HOSPITAL_COMMUNITY): Payer: Self-pay | Admitting: Emergency Medicine

## 2018-08-13 ENCOUNTER — Emergency Department (HOSPITAL_COMMUNITY): Payer: Medicare Other

## 2018-08-13 ENCOUNTER — Other Ambulatory Visit: Payer: Self-pay

## 2018-08-13 ENCOUNTER — Emergency Department (HOSPITAL_COMMUNITY)
Admission: EM | Admit: 2018-08-13 | Discharge: 2018-08-13 | Disposition: A | Discharge status: Home / Self Care | Payer: Medicare Other | Attending: Emergency Medicine | Admitting: Emergency Medicine

## 2018-08-13 DIAGNOSIS — R2241 Localized swelling, mass and lump, right lower limb: Secondary | ICD-10-CM | POA: Diagnosis not present

## 2018-08-13 DIAGNOSIS — I10 Essential (primary) hypertension: Secondary | ICD-10-CM | POA: Insufficient documentation

## 2018-08-13 DIAGNOSIS — R1011 Right upper quadrant pain: Secondary | ICD-10-CM | POA: Insufficient documentation

## 2018-08-13 DIAGNOSIS — R109 Unspecified abdominal pain: Secondary | ICD-10-CM

## 2018-08-13 DIAGNOSIS — R918 Other nonspecific abnormal finding of lung field: Secondary | ICD-10-CM | POA: Diagnosis not present

## 2018-08-13 DIAGNOSIS — Z950 Presence of cardiac pacemaker: Secondary | ICD-10-CM | POA: Diagnosis not present

## 2018-08-13 DIAGNOSIS — Z7982 Long term (current) use of aspirin: Secondary | ICD-10-CM | POA: Insufficient documentation

## 2018-08-13 DIAGNOSIS — I2510 Atherosclerotic heart disease of native coronary artery without angina pectoris: Secondary | ICD-10-CM | POA: Insufficient documentation

## 2018-08-13 DIAGNOSIS — Z79899 Other long term (current) drug therapy: Secondary | ICD-10-CM | POA: Diagnosis not present

## 2018-08-13 DIAGNOSIS — R072 Precordial pain: Secondary | ICD-10-CM | POA: Diagnosis not present

## 2018-08-13 DIAGNOSIS — R0789 Other chest pain: Secondary | ICD-10-CM | POA: Diagnosis not present

## 2018-08-13 DIAGNOSIS — R079 Chest pain, unspecified: Secondary | ICD-10-CM | POA: Diagnosis not present

## 2018-08-13 LAB — CBC WITH DIFFERENTIAL/PLATELET
Abs Immature Granulocytes: 0.01 10*3/uL (ref 0.00–0.07)
Basophils Absolute: 0 10*3/uL (ref 0.0–0.1)
Basophils Relative: 0 %
Eosinophils Absolute: 0.1 10*3/uL (ref 0.0–0.5)
Eosinophils Relative: 1 %
HEMATOCRIT: 48.4 % — AB (ref 36.0–46.0)
Hemoglobin: 15.6 g/dL — ABNORMAL HIGH (ref 12.0–15.0)
Immature Granulocytes: 0 %
LYMPHS ABS: 2.2 10*3/uL (ref 0.7–4.0)
Lymphocytes Relative: 33 %
MCH: 30.6 pg (ref 26.0–34.0)
MCHC: 32.2 g/dL (ref 30.0–36.0)
MCV: 95.1 fL (ref 80.0–100.0)
MONO ABS: 0.7 10*3/uL (ref 0.1–1.0)
MONOS PCT: 10 %
Neutro Abs: 3.7 10*3/uL (ref 1.7–7.7)
Neutrophils Relative %: 56 %
Platelets: 229 10*3/uL (ref 150–400)
RBC: 5.09 MIL/uL (ref 3.87–5.11)
RDW: 13.2 % (ref 11.5–15.5)
WBC: 6.7 10*3/uL (ref 4.0–10.5)
nRBC: 0 % (ref 0.0–0.2)

## 2018-08-13 LAB — COMPREHENSIVE METABOLIC PANEL
ALT: 16 U/L (ref 0–44)
AST: 33 U/L (ref 15–41)
Albumin: 4 g/dL (ref 3.5–5.0)
Alkaline Phosphatase: 49 U/L (ref 38–126)
Anion gap: 9 (ref 5–15)
BUN: 14 mg/dL (ref 8–23)
CO2: 24 mmol/L (ref 22–32)
Calcium: 9.4 mg/dL (ref 8.9–10.3)
Chloride: 107 mmol/L (ref 98–111)
Creatinine, Ser: 0.86 mg/dL (ref 0.44–1.00)
GFR calc Af Amer: >60 mL/min (ref >60)
GFR calc non Af Amer: >60 mL/min (ref >60)
Glucose, Bld: 124 mg/dL — ABNORMAL HIGH (ref 70–99)
Potassium: 5.3 mmol/L — ABNORMAL HIGH (ref 3.5–5.1)
Sodium: 140 mmol/L (ref 135–145)
Total Bilirubin: 2 mg/dL — ABNORMAL HIGH (ref 0.3–1.2)
Total Protein: 7.3 g/dL (ref 6.5–8.1)

## 2018-08-13 LAB — TROPONIN I
Troponin I: <0.03 ng/mL (ref <0.03)
Troponin I: <0.03 ng/mL (ref <0.03)

## 2018-08-13 LAB — URINALYSIS, ROUTINE W REFLEX MICROSCOPIC
Bilirubin Urine: NEGATIVE
Glucose, UA: NEGATIVE mg/dL
Hgb urine dipstick: NEGATIVE
Ketones, ur: NEGATIVE mg/dL
Leukocytes, UA: NEGATIVE
Nitrite: NEGATIVE
Protein, ur: NEGATIVE mg/dL
Specific Gravity, Urine: 1.01 (ref 1.005–1.030)
pH: 7 (ref 5.0–8.0)

## 2018-08-13 LAB — D-DIMER, QUANTITATIVE: D-Dimer, Quant: 0.38 ug/mL-FEU (ref 0.00–0.50)

## 2018-08-13 NOTE — ED Triage Notes (Signed)
Per [facility] pt coming from home reports this morning when getting up to use restroom she had severe upper back pain radiating into bilateral arms and jaw about 430am. 324 aspirin PTA. Patient has pacemaker. Patient alert and orientated x4.

## 2018-08-13 NOTE — ED Provider Notes (Signed)
[facility] EMERGENCY DEPARTMENT Provider Note   CSN: [id number] Arrival date & time: 08/13/18  0930     History   Chief Complaint Chief Complaint  Patient presents with  . Chest Pain    HPI Katherine Cardenas is a 78 y.o. female.  The history is provided by the patient and medical records. No language interpreter was used.  Chest Pain     Katherine Cardenas is a 78 y.o. female who presents to the Emergency Department complaining of chest pain.  She presents to the ED by EMS for evaluation of shoulder/chest pain.  She woke from sleep with pain to bilateral shoulders at 4am.  Pain is described as a hot burning cloak on her shoulders.  Pain then spread to her jaw and chest.  Currently pain is 1/10. Her shoulder pain is completely resolved at this time. She does report feeling poorly today with decreased appetite. She denies any nausea, vomiting, diarrhea, shortness of breath. Past Medical History:  Diagnosis Date  . Anxiety   . Atrial fibrillation ([place])   . Atrial tachycardia ([place])   . Depression   . Facial spasm   . Hypertension   . Tachy-brady syndrome [facility])    a. s/p MDT His Bundle pacemaker implant 2018 - Dr Caryl Comes    Patient Active Problem List   Diagnosis Date Noted  . Syncope 12/07/2016  . Sinus node dysfunction ([place]) 12/07/2016  . Paroxysmal atrial fibrillation ([place]) 12/07/2016  . Hyperlipidemia 01/18/2016  . Ectopic atrial tachycardia ([place]) 02/13/2015  . Essential hypertension 02/13/2015  . Chronic coronary artery disease 12/15/2014    Past Surgical History:  Procedure Laterality Date  . APPENDECTOMY     age 74  . CARDIAC CATHETERIZATION N/A 12/15/2014   Procedure: Left Heart Cath and Coronary Angiography;  Surgeon: Burnell Blanks, MD;  Location: [facility] CV LAB;  Service: Cardiovascular;  Laterality: N/A;  . CATARACT EXTRACTION     Right Eye  . DILATION AND CURETTAGE OF UTERUS  1990's   benign polyp  . PACEMAKER IMPLANT N/A 02/16/2017     Procedure: Pacemaker Implant;  Surgeon: Deboraha Sprang, MD;  Location: [facility];  Service: Cardiovascular;  Laterality: N/A;  . TONSILLECTOMY AND ADENOIDECTOMY     --age 29     OB History    Gravida  3   Para  3   Term  3   Preterm      AB      Living  3     SAB      TAB      Ectopic      Multiple      Live Births               Home Medications    Prior to Admission medications   Medication Sig Start Date End Date Taking? Authorizing Provider  amLODipine (NORVASC) 2.5 MG tablet Take 2.5 mg by mouth daily. 11/11/16  Yes [provider]  Ascorbic Acid (VITAMIN C) 1000 MG tablet Take 1,000 mg by mouth daily before supper.    Yes [provider]  aspirin EC 81 MG tablet Take 81 mg by mouth daily.   Yes [provider]  cholecalciferol (VITAMIN D) 1000 UNITS tablet Take 1,000 Units by mouth at bedtime.    Yes [provider]  Coenzyme Q10 200 MG capsule Take 200 mg by mouth daily.   Yes [provider]  Cranberry 500 MG TABS Take  500 mg by mouth daily.   Yes [provider]  diltiazem (CARDIZEM) 30 MG tablet Take 1 tablet (30 mg total) by mouth daily as needed (for heart rate >100). 05/25/18  Yes Deboraha Sprang, MD  Diphenhyd-Calamine-Benzyl Alc (IVAREST POISON IVY ITCH EX) Apply 1 application topically daily as needed (itching).   Yes [provider]  FINACEA 15 % FOAM Apply 1 application topically daily. 03/24/17  Yes [provider]  LORazepam (ATIVAN) 1 MG tablet Take 1 mg by mouth daily.    Yes [provider]  Magnesium 400 MG CAPS Take 400 mg by mouth daily.   Yes [provider]  nitroGLYCERIN (NITROSTAT) 0.4 MG SL tablet Place 1 tablet (0.4 mg total) under the tongue every 5 (five) minutes x 3 doses as needed for chest pain. 05/25/18  Yes Deboraha Sprang, MD  Omega-3 1000 MG CAPS Take 1,000 mg by mouth daily.   Yes [provider]  Polyethyl  Glycol-Propyl Glycol (SYSTANE OP) Place 1 drop into both eyes 2 (two) times daily.   Yes [provider]  Probiotic Product (PROBIOTIC COMPLEX ACIDOPHILUS) CAPS Take 1 tablet by mouth daily. "Enzyme Probiotic Complex"   Yes [provider]  propafenone (RYTHMOL) 150 MG tablet TAKE 1 TABLET BY MOUTH EVERY 12 HOURS OK TO USE EVERY 8 HOURS AS NEEDED FOR PALPITATIONS Patient taking differently: Take 150 mg by mouth See admin instructions. Taking 1 tablet twice daily, may take 1 tablet three times daily if needed for palpitations. 03/01/18  Yes Deboraha Sprang, MD  Turmeric 500 MG CAPS Take 500 mg by mouth at bedtime.   Yes [provider]  vitamin B-12 (CYANOCOBALAMIN) 1000 MCG tablet Take 1,000 mcg by mouth every other day.   Yes [provider]    Family History Family History  Problem Relation Age of Onset  . Stomach cancer Father 64  . Hypertension Mother   . Renal Disease Mother 88  . Kidney failure Mother   . Hypertension Maternal Grandmother   . Heart attack Maternal Grandmother     Social History Social History   Tobacco Use  . Smoking status: Never Smoker  . Smokeless tobacco: Never Used  Substance Use Topics  . Alcohol use: Yes    Alcohol/week: 14.0 standard drinks    Types: 14 Glasses of wine per week  . Drug use: No     Allergies   Augmentin [amoxicillin-pot clavulanate]; Ciprofloxacin; Diltiazem; Epinephrine; Novocain [procaine]; Avapro [irbesartan]; Shrimp [shellfish allergy]; Iohexol; Penicillins; and Zithromax [azithromycin]   Review of Systems Review of Systems  Cardiovascular: Positive for chest pain.  All other systems reviewed and are negative.    Physical Exam Updated Vital Signs BP (!) 153/81   Pulse 79   Temp 97.8 F (36.6 C) (Oral)   Resp (!) 24   Ht 5\' 6"  (1.676 m)   Wt 66.2 kg   LMP 08/11/1988 (Approximate)   SpO2 98%   BMI 23.57 kg/m   Physical Exam Vitals signs and nursing note reviewed.    Constitutional:      Appearance: She is well-developed.  HENT:     Head: Normocephalic and atraumatic.  Cardiovascular:     Rate and Rhythm: Normal rate and regular rhythm.     Heart sounds: No murmur.  Pulmonary:     Effort: Pulmonary effort is normal. No respiratory distress.     Breath sounds: Normal breath sounds.  Abdominal:     Palpations: Abdomen is soft.  Tenderness: There is no abdominal tenderness. There is no guarding or rebound.  Musculoskeletal:        General: No tenderness.     Comments: Trace non-pitting edema to the right lower extremity  Skin:    General: Skin is warm and dry.     Capillary Refill: Capillary refill takes less than 2 seconds.  Neurological:     Mental Status: She is alert and oriented to person, place, and time.  Psychiatric:        Mood and Affect: Mood normal.        Behavior: Behavior normal.      ED Treatments / Results  Labs (all labs ordered are listed, but only abnormal results are displayed) Labs Reviewed  COMPREHENSIVE METABOLIC PANEL - Abnormal; Notable for the following components:      Result Value   Potassium 5.3 (*)    Glucose, Bld 124 (*)    Total Bilirubin 2.0 (*)    All other components within normal limits  CBC WITH DIFFERENTIAL/PLATELET - Abnormal; Notable for the following components:   Hemoglobin 15.6 (*)    HCT 48.4 (*)    All other components within normal limits  TROPONIN I  URINALYSIS, ROUTINE W REFLEX MICROSCOPIC  TROPONIN I  D-DIMER, QUANTITATIVE (NOT AT [facility])    EKG EKG Interpretation  Date/Time:  Friday August 13 2018 09:34:28 EST Ventricular Rate:  68 PR Interval:    QRS Duration: 109 QT Interval:  433 QTC Calculation: 461 R Axis:   -32 Text Interpretation:  Sinus rhythm Incomplete RBBB and LAFB Probable anteroseptal infarct, old Nonspecific T abnormalities, lateral leads Baseline wander in lead(s) I III aVR aVL Confirmed by Quintella Reichert [phone]) on 08/13/2018 9:42:58 AM   Radiology Dg  Chest 2 View  Result Date: 08/13/2018 CLINICAL DATA:  pt coming from home reports this morning when getting up to use restroom she had severe upper back pain radiating into bilateral arms and jaw about 430am. EXAM: CHEST - 2 VIEW COMPARISON:  02/17/2017 FINDINGS: Stable linear opacities at the left lung base presumably scarring or subsegmental atelectasis. Lungs are otherwise clear. Heart size and mediastinal contours are within normal limits. Stable left subclavian dual transvenous pacemaker. No effusion. No pneumothorax Visualized bones unremarkable. IMPRESSION: No acute cardiopulmonary disease. Electronically Signed   By: Lucrezia Europe M.D.   On: 08/13/2018 10:10   US Abdomen Limited Ruq  Result Date: 08/13/2018 CLINICAL DATA:  Right upper quadrant pain for 2 days EXAM: ULTRASOUND ABDOMEN LIMITED RIGHT UPPER QUADRANT COMPARISON:  None. FINDINGS: Gallbladder: No gallstones or wall thickening visualized. No sonographic Murphy sign noted by sonographer. Common bile duct: Diameter: Maximal diameter is 6 mm. Liver: No focal lesion identified. Within normal limits in parenchymal echogenicity. Portal vein is patent on color Doppler imaging with normal direction of blood flow towards the liver. IMPRESSION: Within normal limits. Electronically Signed   By: Marybelle Killings M.D.   On: 08/13/2018 12:07    Procedures Procedures (including critical care time)  Medications Ordered in ED Medications - No data to display   Initial Impression / Assessment and Plan / ED Course  I have reviewed the triage vital signs and the nursing notes.  Pertinent labs & imaging results that were available during my care of the patient were reviewed by me and considered in my medical decision making (see chart for details).     Patient here for evaluation of pain to bilateral shoulders as well as some chest pain. She is non-toxic  appearing on examination with no acute distress. EKG without acute ischemic changes. Records reviewed  in epic. She did have a prior cardiac cath in 2016, with multivessel non-obstructive coronary artery disease, 20%. Presentation is not consistent with PE, she is low risk by Wells criteria D dimer is negative. Presentation is not consistent with dissection. There is mild elevation in her bilirubin, right upper quadrant ultrasound obtained to evaluate for possible referred pain. Ultrasound is negative for cholecystitis. Troponin is negative times two. Current presentation is not consistent with ACS. Discussed with patient unclear source of her symptoms. Discussed importance of outpatient follow-up as well as return precautions. Potassium is minimally elevated, she is not on any medications that should raise her potassium. Discussed need for outpatient recheck.  Final Clinical Impressions(s) / ED Diagnoses   Final diagnoses:  Abdominal pain  Precordial pain    ED Discharge Orders    None       Quintella Reichert, MD 08/13/18 1521

## 2018-08-13 NOTE — ED Notes (Signed)
Pt verbalized understanding of discharge paperwork and follow-up care.  °

## 2018-08-13 NOTE — Discharge Instructions (Addendum)
The cause of your pain was not identified today.  Your potassium was mildly elevated - please follow up with your doctor for recheck in the next week.  You can take tylenol if your pain returns.  Get rechecked immediately if you have any new or concerning symptoms.

## 2018-08-13 NOTE — ED Notes (Signed)
Patient transported to X-ray 

## 2018-08-13 NOTE — ED Notes (Signed)
Patient transported to Ultrasound 

## 2018-08-17 DIAGNOSIS — E875 Hyperkalemia: Secondary | ICD-10-CM | POA: Diagnosis not present

## 2018-08-23 ENCOUNTER — Ambulatory Visit (INDEPENDENT_AMBULATORY_CARE_PROVIDER_SITE_OTHER): Payer: Medicare Other

## 2018-08-23 DIAGNOSIS — I495 Sick sinus syndrome: Secondary | ICD-10-CM

## 2018-08-23 DIAGNOSIS — R55 Syncope and collapse: Secondary | ICD-10-CM

## 2018-08-24 NOTE — Progress Notes (Signed)
Remote pacemaker transmission.   

## 2018-08-26 LAB — CUP PACEART REMOTE DEVICE CHECK
Battery Remaining Longevity: 155 mo
Battery Voltage: 3.04 V
Brady Statistic AP VP Percent: 0.22 %
Brady Statistic AS VP Percent: 0.02 %
Brady Statistic AS VS Percent: 56.75 %
Brady Statistic RA Percent Paced: 43.31 %
Brady Statistic RV Percent Paced: 0.23 %
Date Time Interrogation Session: 20200113042539
Implantable Lead Implant Date: 20180709
Implantable Lead Implant Date: 20180709
Implantable Lead Location: 753859
Implantable Lead Location: 753860
Implantable Lead Model: 3830
Implantable Lead Model: 5076
Implantable Pulse Generator Implant Date: 20180709
Lead Channel Impedance Value: 266 Ohm
Lead Channel Impedance Value: 304 Ohm
Lead Channel Impedance Value: 380 Ohm
Lead Channel Impedance Value: 437 Ohm
Lead Channel Pacing Threshold Amplitude: 0.75 V
Lead Channel Pacing Threshold Pulse Width: 0.4 ms
Lead Channel Sensing Intrinsic Amplitude: 2.125 mV
Lead Channel Sensing Intrinsic Amplitude: 2.125 mV
Lead Channel Setting Pacing Amplitude: 2.5 V
Lead Channel Setting Sensing Sensitivity: 2 mV
MDC IDC MSMT LEADCHNL RV SENSING INTR AMPL: 9.25 mV
MDC IDC MSMT LEADCHNL RV SENSING INTR AMPL: 9.25 mV
MDC IDC SET LEADCHNL RA PACING AMPLITUDE: 1.5 V
MDC IDC SET LEADCHNL RV PACING PULSEWIDTH: 1 ms
MDC IDC STAT BRADY AP VS PERCENT: 43.02 %

## 2018-08-31 DIAGNOSIS — H00021 Hordeolum internum right upper eyelid: Secondary | ICD-10-CM | POA: Diagnosis not present

## 2018-09-01 DIAGNOSIS — M65311 Trigger thumb, right thumb: Secondary | ICD-10-CM | POA: Diagnosis not present

## 2018-09-01 DIAGNOSIS — R202 Paresthesia of skin: Secondary | ICD-10-CM | POA: Diagnosis not present

## 2018-09-01 DIAGNOSIS — I10 Essential (primary) hypertension: Secondary | ICD-10-CM | POA: Diagnosis not present

## 2018-09-01 DIAGNOSIS — R269 Unspecified abnormalities of gait and mobility: Secondary | ICD-10-CM | POA: Diagnosis not present

## 2018-09-01 DIAGNOSIS — M25572 Pain in left ankle and joints of left foot: Secondary | ICD-10-CM | POA: Diagnosis not present

## 2018-09-01 DIAGNOSIS — I83813 Varicose veins of bilateral lower extremities with pain: Secondary | ICD-10-CM | POA: Diagnosis not present

## 2018-09-01 DIAGNOSIS — I480 Paroxysmal atrial fibrillation: Secondary | ICD-10-CM | POA: Diagnosis not present

## 2018-09-09 DIAGNOSIS — H00021 Hordeolum internum right upper eyelid: Secondary | ICD-10-CM | POA: Diagnosis not present

## 2018-09-10 ENCOUNTER — Telehealth: Payer: Self-pay | Admitting: Internal Medicine

## 2018-09-10 NOTE — Telephone Encounter (Signed)
Dr. Klein aware 

## 2018-09-10 NOTE — Telephone Encounter (Signed)
New message   Patient states that she had a stye lanced under eyelid and is taking tobramycin. Patient is wanting Dr. Caryl Comes to know that she had this procedure done.

## 2018-09-14 IMAGING — CR DG CHEST 2V
2 series · 2 of 2 positions shown · non-contrast
Comparison: PA and lateral chest x-ray December 06, 2016

CLINICAL DATA: Follow-up recent abnormal chest x-ray. The patient
ports upper back pain centered between the shoulder blades. History
of atrial fibrillation, coronary artery disease.

EXAM:
CHEST  2 VIEW

[w chest pa]
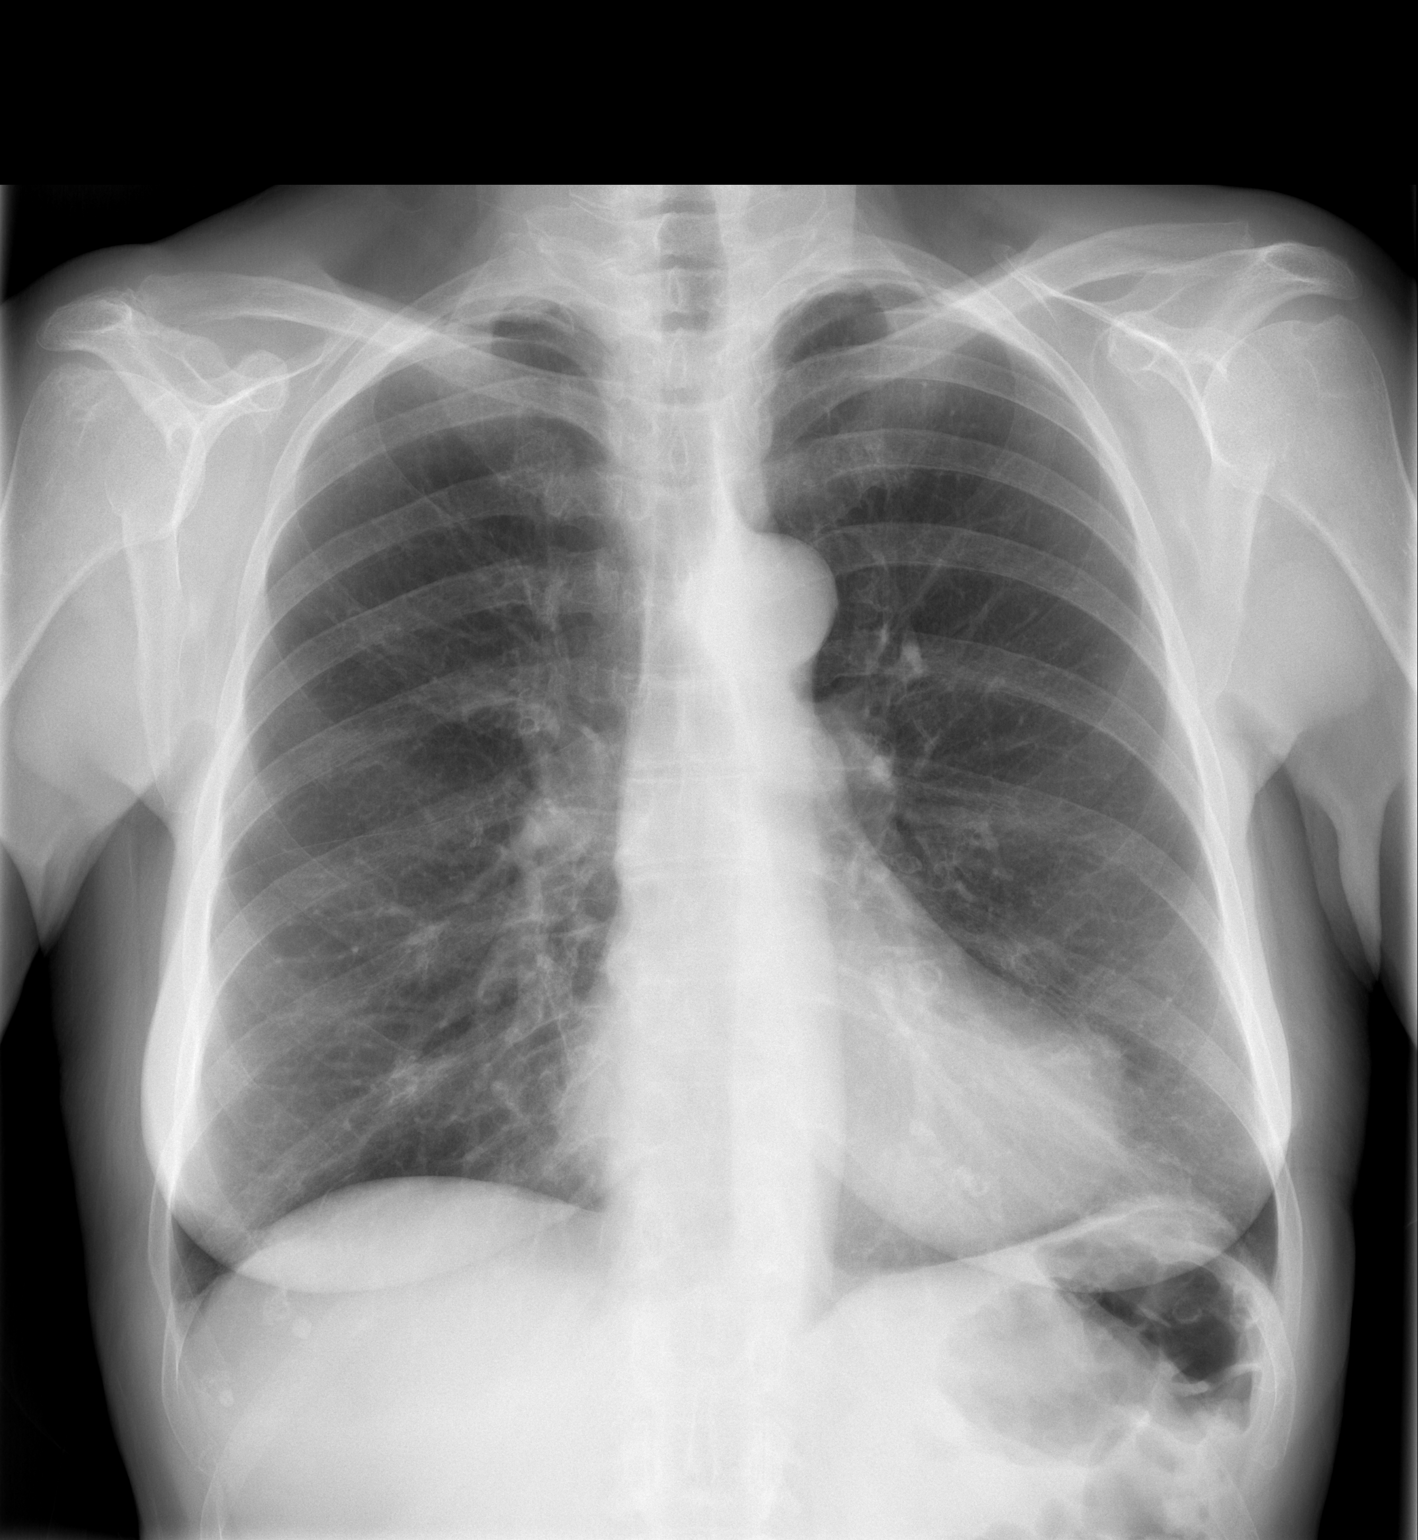

[w chest lat]
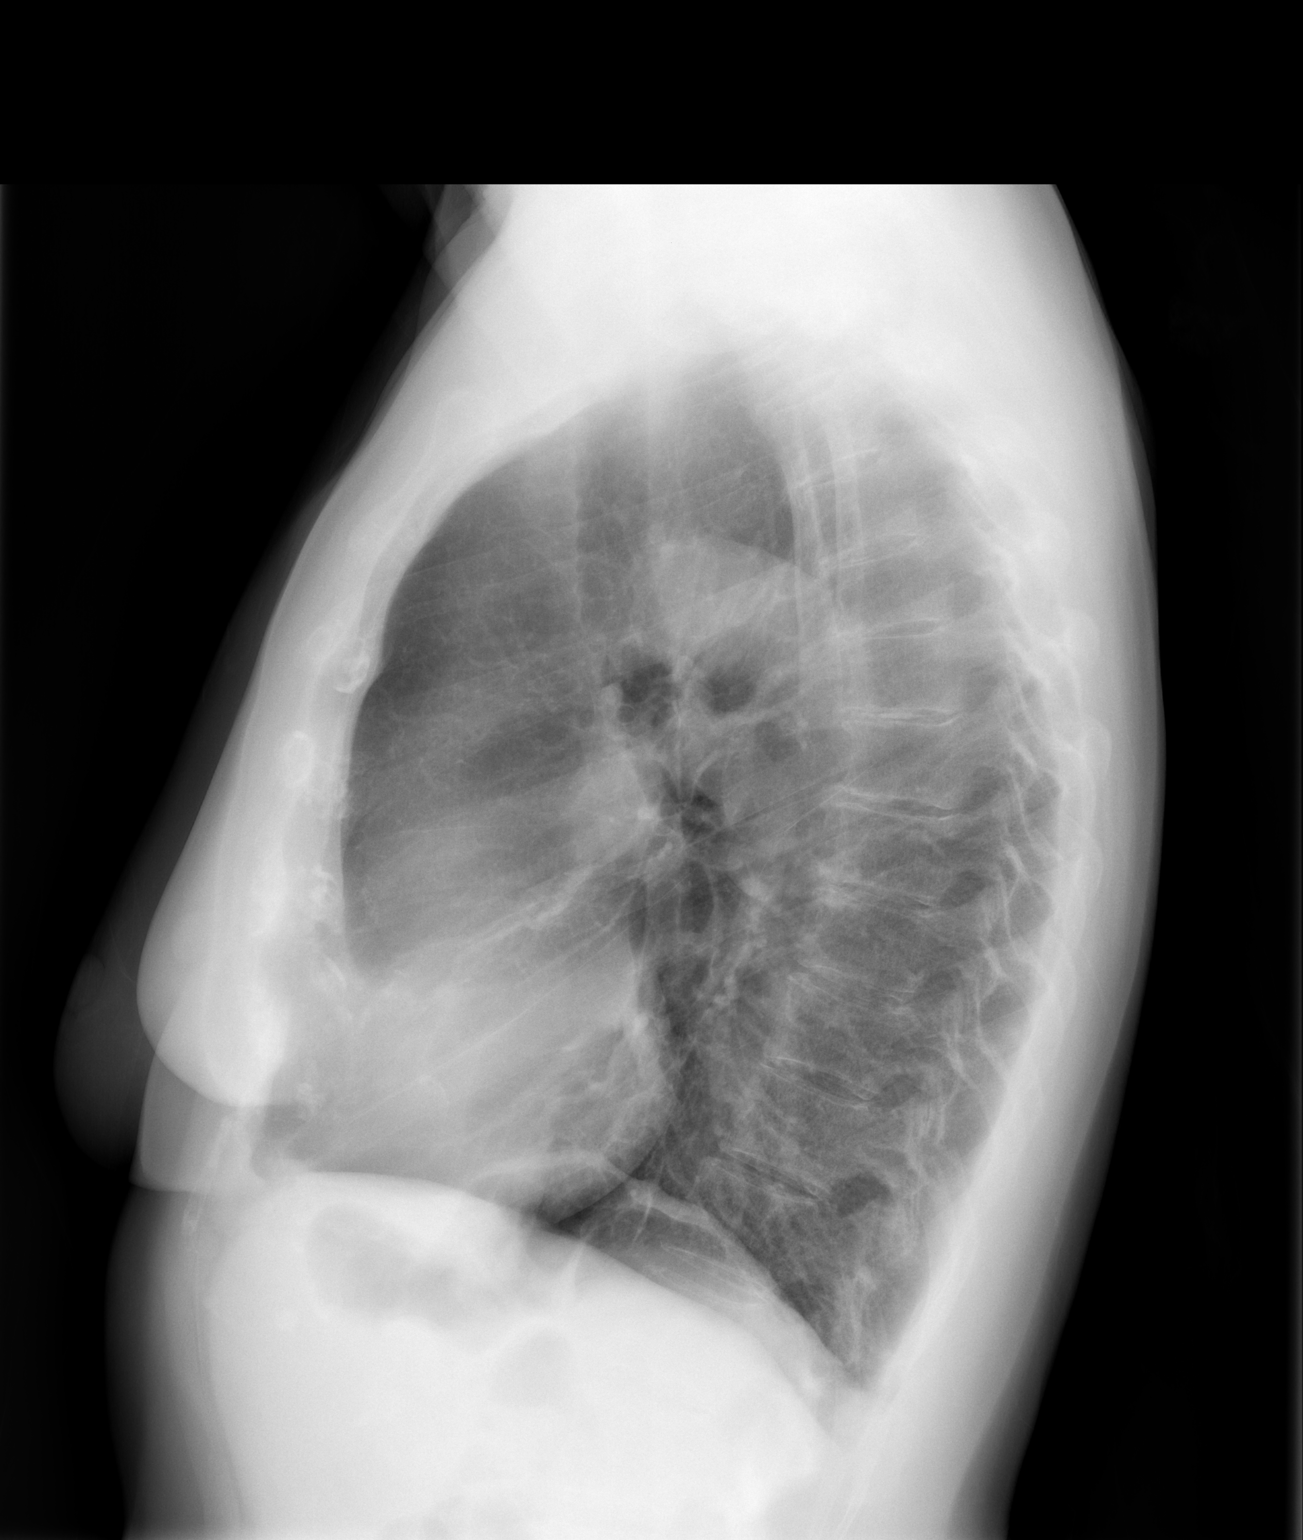

[2 of 2 positions shown; findings below may reference images not displayed]

FINDINGS: The lungs are well-expanded. There is no focal infiltrate. There is
no pleural effusion. No significant atelectasis is observed. The
heart and pulmonary vascularity are normal. The mediastinum is
normal in width. The trachea is midline. There is mild multilevel
degenerative disc disease of the thoracic spine.
IMPRESSION: There is no pneumonia nor CHF. There are mild chronic bronchitic
changes. If the patient's symptoms persist and remain unexplained,
chest CT scanning would be a useful next imaging step.

## 2018-09-28 DIAGNOSIS — R2689 Other abnormalities of gait and mobility: Secondary | ICD-10-CM | POA: Diagnosis not present

## 2018-09-28 DIAGNOSIS — M6281 Muscle weakness (generalized): Secondary | ICD-10-CM | POA: Diagnosis not present

## 2018-10-05 DIAGNOSIS — R2689 Other abnormalities of gait and mobility: Secondary | ICD-10-CM | POA: Diagnosis not present

## 2018-10-05 DIAGNOSIS — M6281 Muscle weakness (generalized): Secondary | ICD-10-CM | POA: Diagnosis not present

## 2018-10-12 DIAGNOSIS — M6281 Muscle weakness (generalized): Secondary | ICD-10-CM | POA: Diagnosis not present

## 2018-10-12 DIAGNOSIS — R2689 Other abnormalities of gait and mobility: Secondary | ICD-10-CM | POA: Diagnosis not present

## 2018-10-19 DIAGNOSIS — M6281 Muscle weakness (generalized): Secondary | ICD-10-CM | POA: Diagnosis not present

## 2018-10-19 DIAGNOSIS — R2689 Other abnormalities of gait and mobility: Secondary | ICD-10-CM | POA: Diagnosis not present

## 2018-10-20 DIAGNOSIS — H2512 Age-related nuclear cataract, left eye: Secondary | ICD-10-CM | POA: Diagnosis not present

## 2018-10-20 DIAGNOSIS — H40013 Open angle with borderline findings, low risk, bilateral: Secondary | ICD-10-CM | POA: Diagnosis not present

## 2018-10-20 DIAGNOSIS — Z961 Presence of intraocular lens: Secondary | ICD-10-CM | POA: Diagnosis not present

## 2018-10-27 ENCOUNTER — Telehealth: Payer: Self-pay | Admitting: Cardiology

## 2018-10-27 NOTE — Telephone Encounter (Signed)
Pt has questions as to how often she can take her PRN Diltiazem. I advised her q8h. She also has questions regarding propafenone adjustments. I've advised her to wait until her remote transmission is read. If it shows increased episodes of AF, Dr Caryl Comes may advise some med changes.

## 2018-10-27 NOTE — Telephone Encounter (Signed)
Patient called and stated that she woke up last night w/ a rapid pulse of 130 bpm. Patient wants clarification on her Diltiazem medicine. She sent a transmission. Transmission received. She is also has questions about her propafenone.

## 2018-10-27 NOTE — Telephone Encounter (Signed)
Pt has been in recurrent persistent AF since 10/25/18.

## 2018-10-28 NOTE — Telephone Encounter (Signed)
Will send a transmission in the am to confirm presumed sinus rhythm

## 2018-10-28 NOTE — Telephone Encounter (Signed)
Called and spoke to pt regarding atrial fibrillation   Currently symptoms are improved with reversion sinus rhythm  Continue dilt 30 q6 h as needed      Pt is agreeable and advised to call if interval problems

## 2018-10-29 NOTE — Telephone Encounter (Signed)
Manual transmission received on 10/29/18 at 11:05. Presenting rhythm shows NSR at 65bpm. Advised patient of findings, explained current AT/AF burden alert programming (alerts for episode >6hr duration), and encouraged patient to call our office for any symptomatic episodes in the future. She verbalizes understanding and thanked me for my call.

## 2018-11-22 ENCOUNTER — Other Ambulatory Visit: Payer: Self-pay

## 2018-11-22 ENCOUNTER — Ambulatory Visit (INDEPENDENT_AMBULATORY_CARE_PROVIDER_SITE_OTHER): Payer: Medicare Other | Admitting: *Deleted

## 2018-11-22 DIAGNOSIS — I495 Sick sinus syndrome: Secondary | ICD-10-CM | POA: Diagnosis not present

## 2018-11-23 LAB — CUP PACEART REMOTE DEVICE CHECK
Battery Remaining Longevity: 151 mo
Battery Voltage: 3.03 V
Brady Statistic AP VP Percent: 0.08 %
Brady Statistic AP VS Percent: 57.72 %
Brady Statistic AS VP Percent: 0 %
Brady Statistic AS VS Percent: 42.2 %
Brady Statistic RA Percent Paced: 57.9 %
Brady Statistic RV Percent Paced: 0.08 %
Date Time Interrogation Session: 20200413144042
Implantable Lead Implant Date: 20180709
Implantable Lead Implant Date: 20180709
Implantable Lead Location: 753859
Implantable Lead Location: 753860
Implantable Lead Model: 3830
Implantable Lead Model: 5076
Implantable Pulse Generator Implant Date: 20180709
Lead Channel Impedance Value: 266 Ohm
Lead Channel Impedance Value: 304 Ohm
Lead Channel Impedance Value: 361 Ohm
Lead Channel Impedance Value: 418 Ohm
Lead Channel Pacing Threshold Amplitude: 0.625 V
Lead Channel Pacing Threshold Pulse Width: 0.4 ms
Lead Channel Sensing Intrinsic Amplitude: 2.25 mV
Lead Channel Sensing Intrinsic Amplitude: 2.25 mV
Lead Channel Sensing Intrinsic Amplitude: 8.875 mV
Lead Channel Sensing Intrinsic Amplitude: 8.875 mV
Lead Channel Setting Pacing Amplitude: 1.5 V
Lead Channel Setting Pacing Amplitude: 2.5 V
Lead Channel Setting Pacing Pulse Width: 1 ms
Lead Channel Setting Sensing Sensitivity: 2 mV

## 2018-12-03 ENCOUNTER — Encounter: Payer: Self-pay | Admitting: Cardiology

## 2018-12-03 NOTE — Progress Notes (Signed)
Remote pacemaker transmission.   

## 2019-01-21 ENCOUNTER — Other Ambulatory Visit: Payer: Self-pay | Admitting: Geriatric Medicine

## 2019-01-21 ENCOUNTER — Ambulatory Visit
Admission: RE | Admit: 2019-01-21 | Discharge: 2019-01-21 | Disposition: A | Discharge status: Home / Self Care | Payer: Medicare Other | Source: Ambulatory Visit | Attending: Geriatric Medicine | Admitting: Geriatric Medicine

## 2019-01-21 DIAGNOSIS — R410 Disorientation, unspecified: Secondary | ICD-10-CM

## 2019-01-21 DIAGNOSIS — S0990XA Unspecified injury of head, initial encounter: Secondary | ICD-10-CM | POA: Diagnosis not present

## 2019-01-24 ENCOUNTER — Telehealth: Payer: Self-pay | Admitting: Neurology

## 2019-01-24 NOTE — Telephone Encounter (Signed)
I called the patient.  The patient has had a CT scan of the brain done on 12 June, this is relatively unremarkable, no evidence of subdural hematoma.  The patient wants to hold off on a new patient visit for now, she will work on eliminating stress from her wife and see if her cognitive issues improved.  CT head 01/21/19:  IMPRESSION: 1. No acute intracranial abnormalities. 2. Age-appropriate volume loss. Mild chronic microvascular ischemic change.

## 2019-01-24 NOTE — Telephone Encounter (Signed)
Patient called in and said that she had spoken to Dr. Eugenie Birks last week conserning a fall she had and wanted to let you know that she has gotten her CT done through her PCP and wanted to know if you can take a look at it and see if she needs to come in sooner that July/

## 2019-02-17 DIAGNOSIS — H00021 Hordeolum internum right upper eyelid: Secondary | ICD-10-CM | POA: Diagnosis not present

## 2019-02-21 ENCOUNTER — Ambulatory Visit (INDEPENDENT_AMBULATORY_CARE_PROVIDER_SITE_OTHER): Payer: Medicare Other | Admitting: *Deleted

## 2019-02-21 DIAGNOSIS — I495 Sick sinus syndrome: Secondary | ICD-10-CM

## 2019-02-21 DIAGNOSIS — I480 Paroxysmal atrial fibrillation: Secondary | ICD-10-CM

## 2019-02-22 LAB — CUP PACEART REMOTE DEVICE CHECK
Battery Remaining Longevity: 148 mo
Battery Voltage: 3.02 V
Brady Statistic AP VP Percent: 0.04 %
Brady Statistic AP VS Percent: 56.07 %
Brady Statistic AS VP Percent: 0 %
Brady Statistic AS VS Percent: 43.89 %
Brady Statistic RA Percent Paced: 56.23 %
Brady Statistic RV Percent Paced: 0.05 %
Date Time Interrogation Session: 20200714092855
Implantable Lead Implant Date: 20180709
Implantable Lead Implant Date: 20180709
Implantable Lead Location: 753859
Implantable Lead Location: 753860
Implantable Lead Model: 3830
Implantable Lead Model: 5076
Implantable Pulse Generator Implant Date: 20180709
Lead Channel Impedance Value: 285 Ohm
Lead Channel Impedance Value: 304 Ohm
Lead Channel Impedance Value: 380 Ohm
Lead Channel Impedance Value: 418 Ohm
Lead Channel Pacing Threshold Amplitude: 0.625 V
Lead Channel Pacing Threshold Pulse Width: 0.4 ms
Lead Channel Sensing Intrinsic Amplitude: 1.375 mV
Lead Channel Sensing Intrinsic Amplitude: 1.375 mV
Lead Channel Sensing Intrinsic Amplitude: 9.375 mV
Lead Channel Sensing Intrinsic Amplitude: 9.375 mV
Lead Channel Setting Pacing Amplitude: 1.5 V
Lead Channel Setting Pacing Amplitude: 2.5 V
Lead Channel Setting Pacing Pulse Width: 1 ms
Lead Channel Setting Sensing Sensitivity: 2 mV

## 2019-03-07 NOTE — Progress Notes (Signed)
Remote pacemaker transmission.   

## 2019-04-05 ENCOUNTER — Other Ambulatory Visit: Payer: Self-pay | Admitting: Internal Medicine

## 2019-04-05 MED ORDER — PROPAFENONE HCL 150 MG PO TABS: ORAL_TABLET | ORAL | 1 refills | Status: DC

## 2019-04-06 DIAGNOSIS — E7800 Pure hypercholesterolemia, unspecified: Secondary | ICD-10-CM | POA: Diagnosis not present

## 2019-04-06 DIAGNOSIS — M810 Age-related osteoporosis without current pathological fracture: Secondary | ICD-10-CM | POA: Diagnosis not present

## 2019-04-06 DIAGNOSIS — I480 Paroxysmal atrial fibrillation: Secondary | ICD-10-CM | POA: Diagnosis not present

## 2019-04-06 DIAGNOSIS — I10 Essential (primary) hypertension: Secondary | ICD-10-CM | POA: Diagnosis not present

## 2019-04-06 DIAGNOSIS — F325 Major depressive disorder, single episode, in full remission: Secondary | ICD-10-CM | POA: Diagnosis not present

## 2019-05-19 ENCOUNTER — Other Ambulatory Visit: Payer: Self-pay | Admitting: Internal Medicine

## 2019-05-19 MED ORDER — PROPAFENONE HCL 150 MG PO TABS: ORAL_TABLET | ORAL | 0 refills | Status: DC

## 2019-05-24 ENCOUNTER — Ambulatory Visit (INDEPENDENT_AMBULATORY_CARE_PROVIDER_SITE_OTHER): Payer: Medicare Other | Admitting: *Deleted

## 2019-05-24 DIAGNOSIS — I495 Sick sinus syndrome: Secondary | ICD-10-CM

## 2019-05-24 DIAGNOSIS — F488 Other specified nonpsychotic mental disorders: Secondary | ICD-10-CM

## 2019-05-24 LAB — CUP PACEART REMOTE DEVICE CHECK
Battery Remaining Longevity: 143 mo
Battery Voltage: 3.02 V
Brady Statistic AP VP Percent: 0.09 %
Brady Statistic AP VS Percent: 62.75 %
Brady Statistic AS VP Percent: 0.01 %
Brady Statistic AS VS Percent: 37.16 %
Brady Statistic RA Percent Paced: 63.11 %
Brady Statistic RV Percent Paced: 0.09 %
Date Time Interrogation Session: 20201013040534
Implantable Lead Implant Date: 20180709
Implantable Lead Implant Date: 20180709
Implantable Lead Location: 753859
Implantable Lead Location: 753860
Implantable Lead Model: 3830
Implantable Lead Model: 5076
Implantable Pulse Generator Implant Date: 20180709
Lead Channel Impedance Value: 285 Ohm
Lead Channel Impedance Value: 323 Ohm
Lead Channel Impedance Value: 342 Ohm
Lead Channel Impedance Value: 437 Ohm
Lead Channel Pacing Threshold Amplitude: 0.375 V
Lead Channel Pacing Threshold Pulse Width: 0.4 ms
Lead Channel Sensing Intrinsic Amplitude: 2 mV
Lead Channel Sensing Intrinsic Amplitude: 2 mV
Lead Channel Sensing Intrinsic Amplitude: 9.875 mV
Lead Channel Sensing Intrinsic Amplitude: 9.875 mV
Lead Channel Setting Pacing Amplitude: 1.5 V
Lead Channel Setting Pacing Amplitude: 2.5 V
Lead Channel Setting Pacing Pulse Width: 1 ms
Lead Channel Setting Sensing Sensitivity: 2 mV

## 2019-05-26 ENCOUNTER — Telehealth: Payer: Self-pay | Admitting: Internal Medicine

## 2019-05-26 DIAGNOSIS — F325 Major depressive disorder, single episode, in full remission: Secondary | ICD-10-CM | POA: Diagnosis not present

## 2019-05-26 DIAGNOSIS — M810 Age-related osteoporosis without current pathological fracture: Secondary | ICD-10-CM | POA: Diagnosis not present

## 2019-05-26 DIAGNOSIS — E7800 Pure hypercholesterolemia, unspecified: Secondary | ICD-10-CM | POA: Diagnosis not present

## 2019-05-26 DIAGNOSIS — I10 Essential (primary) hypertension: Secondary | ICD-10-CM | POA: Diagnosis not present

## 2019-05-26 DIAGNOSIS — I480 Paroxysmal atrial fibrillation: Secondary | ICD-10-CM | POA: Diagnosis not present

## 2019-05-26 NOTE — Telephone Encounter (Signed)
° ° °  Patient called to request appointment be virtual on 10/20. Patient states "Dr Caryl Comes would not want her in the office" due to pandemic getting worse.   Please call

## 2019-05-31 ENCOUNTER — Other Ambulatory Visit: Payer: Self-pay | Admitting: Internal Medicine

## 2019-05-31 ENCOUNTER — Other Ambulatory Visit: Payer: Self-pay

## 2019-05-31 ENCOUNTER — Telehealth (INDEPENDENT_AMBULATORY_CARE_PROVIDER_SITE_OTHER): Payer: Medicare Other | Admitting: Internal Medicine

## 2019-05-31 VITALS — BP 137/83 | HR 73 | Ht 66.0 in | Wt 148.0 lb

## 2019-05-31 DIAGNOSIS — Z950 Presence of cardiac pacemaker: Secondary | ICD-10-CM | POA: Diagnosis not present

## 2019-05-31 DIAGNOSIS — I495 Sick sinus syndrome: Secondary | ICD-10-CM

## 2019-05-31 DIAGNOSIS — R001 Bradycardia, unspecified: Secondary | ICD-10-CM | POA: Diagnosis not present

## 2019-05-31 DIAGNOSIS — I480 Paroxysmal atrial fibrillation: Secondary | ICD-10-CM

## 2019-05-31 NOTE — Progress Notes (Signed)
Electrophysiology TeleHealth Note   Due to national recommendations of social distancing due to COVID 19, an audio/video telehealth visit is felt to be most appropriate for this patient at this time.  See MyChart message from today for the patient's consent to telehealth for [facility].   Date:  05/31/2019   ID:  Katherine Cardenas, DOB 01/05/1941, MRN [id number]  Location: patient's home  Provider location: [address], [place]  Evaluation Performed: Follow-up visit  PCP:  Katherine Manes, MD  Cardiologist:    Electrophysiologist:  SK   Chief Complaint:  Atrial tachycardia   History of Present Illness:    Katherine Cardenas is a 78 y.o. female who presents via audio/video conferencing for a telehealth visit today.  Since last being seen in our clinic for atrial arrhythmia on propafenone and sinus node dysfunction  the patient reports doing pretty well   Struggling with COVID but doing pretty well --some imbalance issues but no intervurrent falls  DATE PR interval QRSduration Propafenone  5/18  140 92 0  3/19 160 96 150  10/19 Ap 148 96 150 (2x)   Date TC LDL  7/18 194 105        Interval atrial fibrillation 3/20  anticoagulation not initiated-- it had occurred just after a traumatic fall  Thought we would wait to look for spontaneous recurrence and she is aware of risk    Thromboembolic risk factors ( age  -2, HTN-1, Gender-1) for a CHADSVASc Score of 4  The patient denies symptoms of fevers, chills, cough, or new SOB worrisome for COVID 19.    Past Medical History:  Diagnosis Date  . Anxiety   . Atrial fibrillation ([place])   . Atrial tachycardia ([place])   . Depression   . Facial spasm   . Hypertension   . Tachy-brady syndrome [facility])    a. s/p MDT His Bundle pacemaker implant 2018 - Dr Katherine Cardenas    Past Surgical History:  Procedure Laterality Date  . APPENDECTOMY     age 46  . CARDIAC CATHETERIZATION N/A 12/15/2014   Procedure: Left Heart Cath and  Coronary Angiography;  Surgeon: Burnell Blanks, MD;  Location: [facility];  Service: Cardiovascular;  Laterality: N/A;  . CATARACT EXTRACTION     Right Eye  . DILATION AND CURETTAGE OF UTERUS  1990's   benign polyp  . PACEMAKER IMPLANT N/A 02/16/2017   Procedure: Pacemaker Implant;  Surgeon: Katherine Sprang, MD;  Location: [facility];  Service: Cardiovascular;  Laterality: N/A;  . TONSILLECTOMY AND ADENOIDECTOMY     --age 78    Current Outpatient Medications  Medication Sig Dispense Refill  . amLODipine (NORVASC) 2.5 MG tablet Take 2.5 mg by mouth daily.    . Ascorbic Acid (VITAMIN C) 1000 MG tablet Take 500 mg by mouth daily before supper.     [name] aspirin EC 81 MG tablet Take 325 mg by mouth daily.     . cholecalciferol (VITAMIN D) 1000 UNITS tablet Take 1,000 Units by mouth at bedtime.     . Coenzyme Q10 200 MG capsule Take 200 mg by mouth daily.    . Cranberry 500 MG TABS Take 500 mg by mouth daily.    [name] diltiazem (CARDIZEM) 30 MG tablet Take 1 tablet (30 mg total) by mouth daily as needed (for heart rate >100). 30 tablet 2  . Diphenhyd-Calamine-Benzyl Alc (IVAREST POISON IVY ITCH EX) Apply 1 application topically daily as  needed (itching).    . LORazepam (ATIVAN) 1 MG tablet Take 1 mg by mouth daily.     . Magnesium 400 MG CAPS Take 400 mg by mouth daily.    . nitroGLYCERIN (NITROSTAT) 0.4 MG SL tablet Place 1 tablet (0.4 mg total) under the tongue every 5 (five) minutes x 3 doses as needed for chest pain. 25 tablet 3  . Omega-3 1000 MG CAPS Take 1,000 mg by mouth daily.    Vladimir Faster Glycol-Propyl Glycol (SYSTANE OP) Place 1 drop into both eyes 2 (two) times daily.    . Probiotic Product (PROBIOTIC COMPLEX ACIDOPHILUS) CAPS Take 1 tablet by mouth daily. "Enzyme Probiotic Complex"    . propafenone (RYTHMOL) 150 MG tablet TAKE 1 TABLET BY MOUTH EVERY 12 HOURS OK TO USE EVERY 8 HOURS AS NEEDED FOR PALPITATIONS. Please keep upcoming appt in October. Thank you 60 tablet  0  . Turmeric 500 MG CAPS Take 500 mg by mouth at bedtime.    . vitamin B-12 (CYANOCOBALAMIN) 1000 MCG tablet Take 1,000 mcg by mouth every other day.     No current facility-administered medications for this visit.     Allergies:   Augmentin [amoxicillin-pot clavulanate], Ciprofloxacin, Diltiazem, Epinephrine, Novocain [procaine], Avapro [irbesartan], Shrimp [shellfish allergy], Iohexol, Penicillins, and Zithromax [azithromycin]   Social History:  The patient  reports that she has never smoked. She has never used smokeless tobacco. She reports current alcohol use of about 14.0 standard drinks of alcohol per week. She reports that she does not use drugs.   Family History:  The patient's   family history includes Heart attack in her maternal grandmother; Hypertension in her maternal grandmother and mother; Kidney failure in her mother; Renal Disease (age of onset: 40) in her mother; Stomach cancer (age of onset: 67) in her father.   ROS:  Please see the history of present illness.   All other systems are personally reviewed and negative.    Exam:    Vital Signs:  BP 137/83   Pulse 73   Ht 5\' 6"  (1.676 m)   Wt 148 lb (67.1 kg)   LMP 08/11/1988 (Approximate)   BMI 23.89 kg/m      Labs/Other Tests and Data Reviewed:    Recent Labs: 08/13/2018: ALT 16; BUN 14; Creatinine, Ser 0.86; Hemoglobin 15.6; Platelets 229; Potassium 5.3; Sodium 140   Wt Readings from Last 3 Encounters:  05/31/19 148 lb (67.1 kg)  08/13/18 146 lb (66.2 kg)  05/25/18 149 lb 12.8 oz (67.9 kg)     Other studies personally reviewed:   Prior device4 interrogation in paceart 1020 Normal device function  AFib in 3/20   ASSESSMENT & PLAN:    Syncope  Atrial fibrillation/tachycardia  SCAF  Sinus bradycardia  Pacemaker   Medtronic  Hypertension  Hyperlipidemia    COVID 19 screen The patient denies symptoms of COVID 19 at this time.  The importance of social distancing was discussed today.   Follow-up: 6 m    Current medicines are reviewed at length with the patient today.   The patient does not have concerns regarding her medicines.  The following changes were made today:  none  Labs/ tests ordered today include:   No orders of the defined types were placed in this encounter.   Future tests ( post COVID )     Patient Risk:  after full review of this patients clinical status, I feel that they are at moderate  risk at this time.  Today,  I have spent 10* minutes with the patient with telehealth technology discussing the above.  Signed, Virl Axe, MD  05/31/2019 9:37 PM     [place] [address] [place] [address] [phone] (office) [phone] (fax)

## 2019-05-31 NOTE — Patient Instructions (Signed)

## 2019-06-06 NOTE — Progress Notes (Signed)
Remote pacemaker transmission.   

## 2019-06-27 ENCOUNTER — Other Ambulatory Visit: Payer: Self-pay | Admitting: Internal Medicine

## 2019-07-01 ENCOUNTER — Other Ambulatory Visit: Payer: Self-pay | Admitting: Internal Medicine

## 2019-07-01 MED ORDER — DILTIAZEM HCL 30 MG PO TABS: 30.0000 mg | ORAL_TABLET | Freq: Every day | ORAL | 6 refills | Status: DC | PRN

## 2019-07-01 NOTE — Telephone Encounter (Signed)
Pt's medication was sent to pt's pharmacy as requested. Confirmation received.  °

## 2019-08-10 DIAGNOSIS — I480 Paroxysmal atrial fibrillation: Secondary | ICD-10-CM | POA: Diagnosis not present

## 2019-08-10 DIAGNOSIS — I10 Essential (primary) hypertension: Secondary | ICD-10-CM | POA: Diagnosis not present

## 2019-08-10 DIAGNOSIS — M810 Age-related osteoporosis without current pathological fracture: Secondary | ICD-10-CM | POA: Diagnosis not present

## 2019-08-10 DIAGNOSIS — F325 Major depressive disorder, single episode, in full remission: Secondary | ICD-10-CM | POA: Diagnosis not present

## 2019-08-10 DIAGNOSIS — E7800 Pure hypercholesterolemia, unspecified: Secondary | ICD-10-CM | POA: Diagnosis not present

## 2019-08-19 DIAGNOSIS — M810 Age-related osteoporosis without current pathological fracture: Secondary | ICD-10-CM | POA: Diagnosis not present

## 2019-08-19 DIAGNOSIS — F325 Major depressive disorder, single episode, in full remission: Secondary | ICD-10-CM | POA: Diagnosis not present

## 2019-08-19 DIAGNOSIS — I10 Essential (primary) hypertension: Secondary | ICD-10-CM | POA: Diagnosis not present

## 2019-08-19 DIAGNOSIS — I480 Paroxysmal atrial fibrillation: Secondary | ICD-10-CM | POA: Diagnosis not present

## 2019-08-19 DIAGNOSIS — E7800 Pure hypercholesterolemia, unspecified: Secondary | ICD-10-CM | POA: Diagnosis not present

## 2019-08-23 ENCOUNTER — Ambulatory Visit (INDEPENDENT_AMBULATORY_CARE_PROVIDER_SITE_OTHER): Payer: Medicare Other | Admitting: *Deleted

## 2019-08-23 DIAGNOSIS — F488 Other specified nonpsychotic mental disorders: Secondary | ICD-10-CM | POA: Diagnosis not present

## 2019-08-23 LAB — CUP PACEART REMOTE DEVICE CHECK
Battery Remaining Longevity: 141 mo
Battery Voltage: 3.01 V
Brady Statistic AP VP Percent: 0.04 %
Brady Statistic AP VS Percent: 53.37 %
Brady Statistic AS VP Percent: 0 %
Brady Statistic AS VS Percent: 46.58 %
Brady Statistic RA Percent Paced: 53.6 %
Brady Statistic RV Percent Paced: 0.05 %
Date Time Interrogation Session: 20210111215553
Implantable Lead Implant Date: 20180709
Implantable Lead Implant Date: 20180709
Implantable Lead Location: 753859
Implantable Lead Location: 753860
Implantable Lead Model: 3830
Implantable Lead Model: 5076
Implantable Pulse Generator Implant Date: 20180709
Lead Channel Impedance Value: 285 Ohm
Lead Channel Impedance Value: 323 Ohm
Lead Channel Impedance Value: 361 Ohm
Lead Channel Impedance Value: 437 Ohm
Lead Channel Pacing Threshold Amplitude: 0.375 V
Lead Channel Pacing Threshold Pulse Width: 0.4 ms
Lead Channel Sensing Intrinsic Amplitude: 1.75 mV
Lead Channel Sensing Intrinsic Amplitude: 1.75 mV
Lead Channel Sensing Intrinsic Amplitude: 10 mV
Lead Channel Sensing Intrinsic Amplitude: 10 mV
Lead Channel Setting Pacing Amplitude: 1.5 V
Lead Channel Setting Pacing Amplitude: 2.5 V
Lead Channel Setting Pacing Pulse Width: 1 ms
Lead Channel Setting Sensing Sensitivity: 2 mV

## 2019-09-09 ENCOUNTER — Ambulatory Visit: Payer: Medicare Other

## 2019-09-16 ENCOUNTER — Ambulatory Visit: Payer: Medicare Other | Attending: Internal Medicine

## 2019-09-16 DIAGNOSIS — Z23 Encounter for immunization: Secondary | ICD-10-CM | POA: Insufficient documentation

## 2019-09-16 NOTE — Progress Notes (Signed)
   Covid-19 Vaccination Clinic  Name:  Katherine Cardenas    MRN: [id number] DOB: 12-06-1940  09/16/2019  [name] was observed post Covid-19 immunization for 15 minutes without incidence. She was provided with Vaccine Information Sheet and instruction to access the V-Safe system.   [name] was instructed to call 911 with any severe reactions post vaccine: [name] Difficulty breathing  . Swelling of your face and throat  . A fast heartbeat  . A bad rash all over your body  . Dizziness and weakness    Immunizations Administered    Name Date Dose VIS Date Route   Pfizer COVID-19 Vaccine 09/16/2019 10:53 AM 0.3 mL 07/22/2019 Intramuscular   Manufacturer: [place]   Lot: [id number]   [place]: [id number]

## 2019-09-20 ENCOUNTER — Ambulatory Visit: Payer: Medicare Other

## 2019-10-07 ENCOUNTER — Telehealth: Payer: Self-pay

## 2019-10-07 NOTE — Telephone Encounter (Signed)
Katherine Cardenas called in regards to her propafenone medication, she is being charged $225 at [facility]. Pt stated [facility] would be faxing over a form to our office to fill out and [facility] to [facility],  changing medication to tier 2, to lower cost. Fax was expected on Monday 2/22 and as of today have not received any fax from [facility]. Please advise and call back pt. Thank you.

## 2019-10-07 NOTE — Telephone Encounter (Signed)
Attempted phone call and left voicemail message for to return call to RN at [phone].

## 2019-10-10 NOTE — Telephone Encounter (Addendum)
After speaking with patient it sounds like she needs a tier exception done for her medication.  This has been done in the past.  Propafenone is currently tier 3 and would cost $147 for a 3 month supply if we can get an exception to tier 2 it will be $12 for a 59month supply.  I called [employer] at [phone] and spoke with representative who faxed over form for Dr. Caryl Comes to complete.  Ref # [id number] After completion will fax in form for patient.  She is aware of the above and realizes that it is a 72 hour turn around once the form is received.  She was grateful for my help.  Received form, completed and faxed into [facility] 10/11/19.

## 2019-10-11 ENCOUNTER — Ambulatory Visit: Payer: Medicare Other | Attending: Internal Medicine

## 2019-10-11 DIAGNOSIS — Z23 Encounter for immunization: Secondary | ICD-10-CM

## 2019-10-11 NOTE — Progress Notes (Signed)
   Covid-19 Vaccination Clinic  Name:  Katherine Cardenas    MRN: [id number] DOB: 1940/09/08  10/11/2019  Katherine Cardenas was observed post Covid-19 immunization for 15 minutes without incident. She was provided with Vaccine Information Sheet and instruction to access the V-Safe system.   Katherine Cardenas was instructed to call 911 with any severe reactions post vaccine: [name] Difficulty breathing  . Swelling of face and throat  . A fast heartbeat  . A bad rash all over body  . Dizziness and weakness   Immunizations Administered    Name Date Dose VIS Date Route   Pfizer COVID-19 Vaccine 10/11/2019 11:42 AM 0.3 mL 07/22/2019 Intramuscular   Manufacturer: [place]   Lot: [id number]   [place]: [id number]

## 2019-10-17 NOTE — Telephone Encounter (Signed)
Pt approved for Propaferone assist by [facility] on 10/07/2019.

## 2019-11-22 ENCOUNTER — Ambulatory Visit (INDEPENDENT_AMBULATORY_CARE_PROVIDER_SITE_OTHER): Payer: Medicare Other | Admitting: *Deleted

## 2019-11-22 DIAGNOSIS — F488 Other specified nonpsychotic mental disorders: Secondary | ICD-10-CM

## 2019-11-22 LAB — CUP PACEART REMOTE DEVICE CHECK
Battery Remaining Longevity: 138 mo
Battery Voltage: 3.01 V
Brady Statistic AP VP Percent: 0.07 %
Brady Statistic AP VS Percent: 59.37 %
Brady Statistic AS VP Percent: 0.01 %
Brady Statistic AS VS Percent: 40.55 %
Brady Statistic RA Percent Paced: 59.57 %
Brady Statistic RV Percent Paced: 0.09 %
Date Time Interrogation Session: 20210412211828
Implantable Lead Implant Date: 20180709
Implantable Lead Implant Date: 20180709
Implantable Lead Location: 753859
Implantable Lead Location: 753860
Implantable Lead Model: 3830
Implantable Lead Model: 5076
Implantable Pulse Generator Implant Date: 20180709
Lead Channel Impedance Value: 285 Ohm
Lead Channel Impedance Value: 323 Ohm
Lead Channel Impedance Value: 380 Ohm
Lead Channel Impedance Value: 437 Ohm
Lead Channel Pacing Threshold Amplitude: 0.375 V
Lead Channel Pacing Threshold Pulse Width: 0.4 ms
Lead Channel Sensing Intrinsic Amplitude: 1.25 mV
Lead Channel Sensing Intrinsic Amplitude: 1.25 mV
Lead Channel Sensing Intrinsic Amplitude: 9.625 mV
Lead Channel Sensing Intrinsic Amplitude: 9.625 mV
Lead Channel Setting Pacing Amplitude: 1.5 V
Lead Channel Setting Pacing Amplitude: 2.5 V
Lead Channel Setting Pacing Pulse Width: 1 ms
Lead Channel Setting Sensing Sensitivity: 2 mV

## 2019-11-23 NOTE — Progress Notes (Signed)
PPM Remote  

## 2020-01-13 DIAGNOSIS — H1131 Conjunctival hemorrhage, right eye: Secondary | ICD-10-CM | POA: Diagnosis not present

## 2020-02-21 ENCOUNTER — Ambulatory Visit (INDEPENDENT_AMBULATORY_CARE_PROVIDER_SITE_OTHER): Payer: Medicare Other | Admitting: *Deleted

## 2020-02-21 DIAGNOSIS — I495 Sick sinus syndrome: Secondary | ICD-10-CM | POA: Diagnosis not present

## 2020-02-22 LAB — CUP PACEART REMOTE DEVICE CHECK
Battery Remaining Longevity: 136 mo
Battery Voltage: 3.01 V
Brady Statistic AP VP Percent: 0.07 %
Brady Statistic AP VS Percent: 61.32 %
Brady Statistic AS VP Percent: 0.01 %
Brady Statistic AS VS Percent: 38.6 %
Brady Statistic RA Percent Paced: 61.52 %
Brady Statistic RV Percent Paced: 0.08 %
Date Time Interrogation Session: 20210714071739
Implantable Lead Implant Date: 20180709
Implantable Lead Implant Date: 20180709
Implantable Lead Location: 753859
Implantable Lead Location: 753860
Implantable Lead Model: 3830
Implantable Lead Model: 5076
Implantable Pulse Generator Implant Date: 20180709
Lead Channel Impedance Value: 285 Ohm
Lead Channel Impedance Value: 323 Ohm
Lead Channel Impedance Value: 418 Ohm
Lead Channel Impedance Value: 437 Ohm
Lead Channel Pacing Threshold Amplitude: 0.5 V
Lead Channel Pacing Threshold Pulse Width: 0.4 ms
Lead Channel Sensing Intrinsic Amplitude: 1.375 mV
Lead Channel Sensing Intrinsic Amplitude: 1.375 mV
Lead Channel Sensing Intrinsic Amplitude: 9.375 mV
Lead Channel Sensing Intrinsic Amplitude: 9.375 mV
Lead Channel Setting Pacing Amplitude: 1.5 V
Lead Channel Setting Pacing Amplitude: 2.5 V
Lead Channel Setting Pacing Pulse Width: 1 ms
Lead Channel Setting Sensing Sensitivity: 2 mV

## 2020-02-22 NOTE — Progress Notes (Signed)
Remote pacemaker transmission.   

## 2020-03-26 DIAGNOSIS — I10 Essential (primary) hypertension: Secondary | ICD-10-CM | POA: Diagnosis not present

## 2020-03-26 DIAGNOSIS — F325 Major depressive disorder, single episode, in full remission: Secondary | ICD-10-CM | POA: Diagnosis not present

## 2020-03-26 DIAGNOSIS — I480 Paroxysmal atrial fibrillation: Secondary | ICD-10-CM | POA: Diagnosis not present

## 2020-03-26 DIAGNOSIS — E7800 Pure hypercholesterolemia, unspecified: Secondary | ICD-10-CM | POA: Diagnosis not present

## 2020-03-26 DIAGNOSIS — M810 Age-related osteoporosis without current pathological fracture: Secondary | ICD-10-CM | POA: Diagnosis not present

## 2020-04-05 ENCOUNTER — Other Ambulatory Visit: Payer: Self-pay | Admitting: Internal Medicine

## 2020-05-22 ENCOUNTER — Ambulatory Visit (INDEPENDENT_AMBULATORY_CARE_PROVIDER_SITE_OTHER): Payer: Medicare Other

## 2020-05-22 DIAGNOSIS — I495 Sick sinus syndrome: Secondary | ICD-10-CM

## 2020-05-22 LAB — CUP PACEART REMOTE DEVICE CHECK
Battery Remaining Longevity: 132 mo
Battery Voltage: 3.01 V
Brady Statistic AP VP Percent: 0.05 %
Brady Statistic AP VS Percent: 59.77 %
Brady Statistic AS VP Percent: 0 %
Brady Statistic AS VS Percent: 40.18 %
Brady Statistic RA Percent Paced: 59.97 %
Brady Statistic RV Percent Paced: 0.05 %
Date Time Interrogation Session: 20211011225056
Implantable Lead Implant Date: 20180709
Implantable Lead Implant Date: 20180709
Implantable Lead Location: 753859
Implantable Lead Location: 753860
Implantable Lead Model: 3830
Implantable Lead Model: 5076
Implantable Pulse Generator Implant Date: 20180709
Lead Channel Impedance Value: 266 Ohm
Lead Channel Impedance Value: 304 Ohm
Lead Channel Impedance Value: 399 Ohm
Lead Channel Impedance Value: 418 Ohm
Lead Channel Pacing Threshold Amplitude: 0.5 V
Lead Channel Pacing Threshold Pulse Width: 0.4 ms
Lead Channel Sensing Intrinsic Amplitude: 2 mV
Lead Channel Sensing Intrinsic Amplitude: 2 mV
Lead Channel Sensing Intrinsic Amplitude: 9.625 mV
Lead Channel Sensing Intrinsic Amplitude: 9.625 mV
Lead Channel Setting Pacing Amplitude: 1.5 V
Lead Channel Setting Pacing Amplitude: 2.5 V
Lead Channel Setting Pacing Pulse Width: 1 ms
Lead Channel Setting Sensing Sensitivity: 2 mV

## 2020-05-25 NOTE — Progress Notes (Signed)
Remote pacemaker transmission.   

## 2020-06-07 DIAGNOSIS — F325 Major depressive disorder, single episode, in full remission: Secondary | ICD-10-CM | POA: Diagnosis not present

## 2020-06-07 DIAGNOSIS — E7800 Pure hypercholesterolemia, unspecified: Secondary | ICD-10-CM | POA: Diagnosis not present

## 2020-06-07 DIAGNOSIS — I10 Essential (primary) hypertension: Secondary | ICD-10-CM | POA: Diagnosis not present

## 2020-06-07 DIAGNOSIS — I480 Paroxysmal atrial fibrillation: Secondary | ICD-10-CM | POA: Diagnosis not present

## 2020-06-27 DIAGNOSIS — R300 Dysuria: Secondary | ICD-10-CM | POA: Diagnosis not present

## 2020-06-27 DIAGNOSIS — Z23 Encounter for immunization: Secondary | ICD-10-CM | POA: Diagnosis not present

## 2020-06-27 DIAGNOSIS — Z950 Presence of cardiac pacemaker: Secondary | ICD-10-CM | POA: Diagnosis not present

## 2020-06-27 DIAGNOSIS — I480 Paroxysmal atrial fibrillation: Secondary | ICD-10-CM | POA: Diagnosis not present

## 2020-07-06 ENCOUNTER — Other Ambulatory Visit: Payer: Self-pay | Admitting: Internal Medicine

## 2020-07-11 DIAGNOSIS — Z23 Encounter for immunization: Secondary | ICD-10-CM | POA: Diagnosis not present

## 2020-08-13 DIAGNOSIS — Z20822 Contact with and (suspected) exposure to covid-19: Secondary | ICD-10-CM | POA: Diagnosis not present

## 2020-08-13 DIAGNOSIS — R509 Fever, unspecified: Secondary | ICD-10-CM | POA: Diagnosis not present

## 2020-08-21 ENCOUNTER — Ambulatory Visit (INDEPENDENT_AMBULATORY_CARE_PROVIDER_SITE_OTHER): Payer: Medicare Other

## 2020-08-21 DIAGNOSIS — I495 Sick sinus syndrome: Secondary | ICD-10-CM | POA: Diagnosis not present

## 2020-08-22 LAB — CUP PACEART REMOTE DEVICE CHECK
Battery Remaining Longevity: 128 mo
Battery Voltage: 3.01 V
Brady Statistic AP VP Percent: 0.09 %
Brady Statistic AP VS Percent: 55.84 %
Brady Statistic AS VP Percent: 0.02 %
Brady Statistic AS VS Percent: 44.05 %
Brady Statistic RA Percent Paced: 56.05 %
Brady Statistic RV Percent Paced: 0.11 %
Date Time Interrogation Session: 20220110224843
Implantable Lead Implant Date: 20180709
Implantable Lead Implant Date: 20180709
Implantable Lead Location: 753859
Implantable Lead Location: 753860
Implantable Lead Model: 3830
Implantable Lead Model: 5076
Implantable Pulse Generator Implant Date: 20180709
Lead Channel Impedance Value: 266 Ohm
Lead Channel Impedance Value: 304 Ohm
Lead Channel Impedance Value: 380 Ohm
Lead Channel Impedance Value: 418 Ohm
Lead Channel Pacing Threshold Amplitude: 0.5 V
Lead Channel Pacing Threshold Pulse Width: 0.4 ms
Lead Channel Sensing Intrinsic Amplitude: 1.375 mV
Lead Channel Sensing Intrinsic Amplitude: 1.375 mV
Lead Channel Sensing Intrinsic Amplitude: 9.375 mV
Lead Channel Sensing Intrinsic Amplitude: 9.375 mV
Lead Channel Setting Pacing Amplitude: 1.5 V
Lead Channel Setting Pacing Amplitude: 2.5 V
Lead Channel Setting Pacing Pulse Width: 1 ms
Lead Channel Setting Sensing Sensitivity: 2 mV

## 2020-09-03 ENCOUNTER — Telehealth: Payer: Self-pay | Admitting: Internal Medicine

## 2020-09-03 DIAGNOSIS — R350 Frequency of micturition: Secondary | ICD-10-CM | POA: Diagnosis not present

## 2020-09-03 NOTE — Telephone Encounter (Signed)
Transmission reviewed 09/03/20. Normal device function. No events logged. AT/AF burden 0.0%. Presenting appears NSR w/ PVC.

## 2020-09-03 NOTE — Telephone Encounter (Signed)
Patient c/o Palpitations:  High priority if patient c/o lightheadedness, shortness of breath, or chest pain  1) How long have you had palpitations/irregular HR/ Afib? Are you having the symptoms now?   2) Are you currently experiencing lightheadedness, SOB or CP?  no  3) Do you have a history of afib (atrial fibrillation) or irregular heart rhythm? Yes   4) Have you checked your BP or HR? (document readings if available): HR 60   5) Are you experiencing any other symptoms? Pt just feels like she doesn't have any energy and doesn't feel good in general    Patient said this is the first time since she has had her pacemaker put in that she has had any afib symptoms. She feels like she is back in rhythm but is not sure what to do next. Please advise

## 2020-09-03 NOTE — Telephone Encounter (Signed)
Spoke with pt and advised per Device clinic no episodes of Afib.  Normal device function shows NSR with PVC.  Reiterated importance of following up with PCP re: urinary symptoms. Pt verbalizes understanding and thanked Therapist, sports for call.

## 2020-09-03 NOTE — Telephone Encounter (Signed)
Spoke with pt who states she believes she was in Afib last night during the night.  Pt reports she sent a PPM transmission.  Pt states she did not feel well and took an extra 1/2 Propafenone and she feels she did convert.  Pt also reports she thinks she could possibly have a UTI as she is having some urgency and frequency.  Pt advised she will need to f/u with her PCP or urgent care re: urinary symptoms.  Will forward to device clinic to have them review transmission.  Reviewed ED precautions.  Pt verbalizes understanding and agrees with current plan.

## 2020-09-07 NOTE — Progress Notes (Signed)
Remote pacemaker transmission.   

## 2020-09-26 DIAGNOSIS — E7800 Pure hypercholesterolemia, unspecified: Secondary | ICD-10-CM | POA: Diagnosis not present

## 2020-09-26 DIAGNOSIS — F325 Major depressive disorder, single episode, in full remission: Secondary | ICD-10-CM | POA: Diagnosis not present

## 2020-09-26 DIAGNOSIS — M810 Age-related osteoporosis without current pathological fracture: Secondary | ICD-10-CM | POA: Diagnosis not present

## 2020-09-26 DIAGNOSIS — I10 Essential (primary) hypertension: Secondary | ICD-10-CM | POA: Diagnosis not present

## 2020-09-26 DIAGNOSIS — I480 Paroxysmal atrial fibrillation: Secondary | ICD-10-CM | POA: Diagnosis not present

## 2020-09-28 DIAGNOSIS — E7800 Pure hypercholesterolemia, unspecified: Secondary | ICD-10-CM | POA: Diagnosis not present

## 2020-09-28 DIAGNOSIS — Z0000 Encounter for general adult medical examination without abnormal findings: Secondary | ICD-10-CM | POA: Diagnosis not present

## 2020-09-28 DIAGNOSIS — G514 Facial myokymia: Secondary | ICD-10-CM | POA: Diagnosis not present

## 2020-09-28 DIAGNOSIS — R1030 Lower abdominal pain, unspecified: Secondary | ICD-10-CM | POA: Diagnosis not present

## 2020-09-28 DIAGNOSIS — Z950 Presence of cardiac pacemaker: Secondary | ICD-10-CM | POA: Diagnosis not present

## 2020-09-28 DIAGNOSIS — I480 Paroxysmal atrial fibrillation: Secondary | ICD-10-CM | POA: Diagnosis not present

## 2020-09-28 DIAGNOSIS — Z79899 Other long term (current) drug therapy: Secondary | ICD-10-CM | POA: Diagnosis not present

## 2020-09-28 DIAGNOSIS — I10 Essential (primary) hypertension: Secondary | ICD-10-CM | POA: Diagnosis not present

## 2020-10-01 ENCOUNTER — Other Ambulatory Visit: Payer: Self-pay | Admitting: Internal Medicine

## 2020-10-01 ENCOUNTER — Telehealth: Payer: Self-pay | Admitting: Internal Medicine

## 2020-10-01 NOTE — Telephone Encounter (Signed)
I called and spoke with patient.  I offered her an appt with AFib Clinic this week, pt declined.

## 2020-10-01 NOTE — Telephone Encounter (Signed)
STAT if HR is under 50 or over 120 (normal HR is 60-100 beats per minute)  1) What is your heart rate? 105 now, have 120 and  117  2) Do you have a log of your heart rate readings (document readings)?  3) Do you have any other symptoms? Do not feel good at all, fluttering in her chest

## 2020-10-01 NOTE — Telephone Encounter (Signed)
Patient was contacted and advised that her transmission showed questionable A-fib/ A flutter. Patient had taken 30 mg's of Cardizem today that has expired. Patient also took her prescribed propafenone 150 mg. Patient was told take her medications as prescribed. Patient has requested a new prescription for Cardizem from her pharmacy. Pharmacy advised patient, no refills on the Cardizem. Patient gave verbal understanding and agreement to be referred to the [facility]. Scheduling also contacted to set up an appointment for patient with Dr. Caryl Comes. Patient given the [facility] number [phone] if any any problems or concerns.   Patient currently complaining of fluttering in her chest and overall not feeling well. Patient denies chest pain or shortness of breath. Advised patient that [facility] would contact her.

## 2020-10-01 NOTE — Telephone Encounter (Signed)
Contacted patient by phone. Patient feels that she may be in A-fib. Patient states symptoms started last night. Patient complains of fluttering in her chest. Patient denies any chest pain or shortness of breath. Overall patient does not feel well today. Patient was asked to send a manual transmission. Patient states that she will send the transmission now and, also states that she took 30 mg of Cardizem that was prescribed to her but had met the expiration date. Device Clinic advised patient  that the clinic would review the transmission and call the patient back after reviewing.

## 2020-10-04 NOTE — Telephone Encounter (Signed)
Pt scheduled with Tommye Standard, PA-C for 10/17/2020.  Pt is aware.

## 2020-10-13 ENCOUNTER — Other Ambulatory Visit: Payer: Self-pay | Admitting: Internal Medicine

## 2020-10-14 NOTE — Progress Notes (Signed)
Cardiology Office Note Date:  10/17/2020  Patient ID:  Katherine Cardenas, Katherine Cardenas [birth year], MRN [id number] PCP:  Lajean Manes, MD  Electrophysiologist: Dr. Caryl Comes    Chief Complaint:   AFib  History of Present Illness: Katherine Cardenas is a 80 y.o. female with history of ATach, HTN, HLD tachy-brady w/PPM, AFib.  She comes in today to be seen for Dr. Caryl Comes, last seen by him oct 2020, discussed an episode of Afib noted March 2020 though occurred after a traumatic fall and a/c not started to follow burden recurrent/spontanoeus episode.  TODAY She is doing OK, predominantly is mostly bothered by her life stressors. Mentions that about this time last year her husband dx with cancer, quickly became very ill and home with Hospice, did in October.  Only a couple months later developed a large pipe leak and her home has bee under construction for this since then, and it has ll been quite overwhelming. She is talking with a grief counselor and has some family support but thinks this contributed to her episode of AF.  She knew that day something felt "off", no overt palpitations but a sense of unease or anxiety that was different. No CP, denies SOB NO dissy spells, near syncope or syncope.  She is starting PT for some gait instability, fell a week or so ago, got tripped up or turned around doing something with her dog and fell.  Just feel like she is not a strong on her feet as she used to be.  Device information MDT dual chamber PPM, implanted 01/17/2017   Past Medical History:  Diagnosis Date  . Anxiety   . Atrial fibrillation ([place])   . Atrial tachycardia ([place])   . Depression   . Facial spasm   . Hypertension   . Tachy-brady syndrome [facility])    a. s/p MDT His Bundle pacemaker implant 2018 - Dr Caryl Comes    Past Surgical History:  Procedure Laterality Date  . APPENDECTOMY     age 16  . CARDIAC CATHETERIZATION N/A 12/15/2014   Procedure: Left Heart Cath and Coronary Angiography;  Surgeon:  Burnell Blanks, MD;  Location: [place] [facility];  Service: Cardiovascular;  Laterality: N/A;  . CATARACT EXTRACTION     Right Eye  . DILATION AND CURETTAGE OF UTERUS  1990's   benign polyp  . PACEMAKER IMPLANT N/A 02/16/2017   Procedure: Pacemaker Implant;  Surgeon: Deboraha Sprang, MD;  Location: [place] [facility];  Service: Cardiovascular;  Laterality: N/A;  . TONSILLECTOMY AND ADENOIDECTOMY     --age 73    Current Outpatient Medications  Medication Sig Dispense Refill  . amLODipine (NORVASC) 2.5 MG tablet Take 2.5 mg by mouth daily.    . Ascorbic Acid (VITAMIN C) 1000 MG tablet Take 500 mg by mouth daily before supper.     [name] aspirin EC 81 MG tablet Take 81 mg by mouth daily.    . cholecalciferol (VITAMIN D) 1000 UNITS tablet Take 1,000 Units by mouth at bedtime.     . Coenzyme Q10 200 MG capsule Take 200 mg by mouth daily.    . Cranberry 500 MG TABS Take 500 mg by mouth daily.    [name] diltiazem (CARDIZEM) 30 MG tablet TAKE 1 TABLET DAILY AS NEEDED. FOR HEART RATE>100. PLEASE MAKE APPT WITH DR. Caryl Comes FOR MORE REFILLS 15 tablet 0  . Diphenhyd-Calamine-Benzyl Alc (IVAREST POISON IVY ITCH EX) Apply 1 application topically daily as needed (itching).    . LORazepam (  ATIVAN) 1 MG tablet Take 1 mg by mouth daily.    . Magnesium 400 MG CAPS Take 400 mg by mouth daily.    . metoprolol succinate (TOPROL XL) 25 MG 24 hr tablet Take 1 tablet (25 mg total) by mouth daily. 90 tablet 3  . nitroGLYCERIN (NITROSTAT) 0.4 MG SL tablet Place 1 tablet (0.4 mg total) under the tongue every 5 (five) minutes x 3 doses as needed for chest pain. 25 tablet 3  . Omega-3 1000 MG CAPS Take 1,000 mg by mouth daily.    Vladimir Faster Glycol-Propyl Glycol (SYSTANE OP) Place 1 drop into both eyes 2 (two) times daily.    . Probiotic Product (PROBIOTIC COMPLEX ACIDOPHILUS) CAPS Take 1 tablet by mouth daily. "Enzyme Probiotic Complex"    . propafenone (RYTHMOL) 150 MG tablet TAKE 1 TABLET BY MOUTH EVERY 12 HOURS MAY USE  EVERY 8 HOURS AS NEEDED FOR PALPITATIONS. NEEDS OV 270 tablet 3  . Turmeric 500 MG CAPS Take 500 mg by mouth at bedtime.    . vitamin B-12 (CYANOCOBALAMIN) 1000 MCG tablet Take 1,000 mcg by mouth every other day.     No current facility-administered medications for this visit.    Allergies:   Augmentin [amoxicillin-pot clavulanate], Ciprofloxacin, Diltiazem, Epinephrine, Novocain [procaine], Avapro [irbesartan], Shrimp [shellfish allergy], Iohexol, Penicillins, and Zithromax [azithromycin]   Social History:  The patient  reports that she has never smoked. She has never used smokeless tobacco. She reports current alcohol use of about 14.0 standard drinks of alcohol per week. She reports that she does not use drugs.   Family History:  The patient's family history includes Heart attack in her maternal grandmother; Hypertension in her maternal grandmother and mother; Kidney failure in her mother; Renal Disease (age of onset: 98) in her mother; Stomach cancer (age of onset: 77) in her father.  ROS:  Please see the history of present illness.    All other systems are reviewed and otherwise negative.   PHYSICAL EXAM:  VS:  BP 132/76   Pulse 72   Ht 5\' 6"  (1.676 m)   Wt 147 lb 9.6 oz (67 kg)   LMP 08/11/1988 (Approximate)   SpO2 98%   BMI 23.82 kg/m  BMI: Body mass index is 23.82 kg/m. Well nourished, well developed, in no acute distress HEENT: normocephalic, atraumatic Neck: no JVD, carotid bruits or masses Cardiac:  RRR; no significant murmurs, no rubs, or gallops Lungs:  CTA b/l, no wheezing, rhonchi or rales Abd: soft, nontender MS: no deformity or age appropriate atrophy Ext: no edema Skin: warm and dry, no rash Neuro:  No gross deficits appreciated Psych: euthymic mood, full affect  PPM site is stable, no tethering or discomfort   EKG:  Done today and reviewed by myself shows  AP/VS, 72bpm, PVC  Device interrogation done today and reviewed by myself: Battery and lead  measurements are good 10/01/20 is the only AF noted since 2020 when she saw Dr. Caryl Comes Episode 7 hours with 2 shorter <1hr duration episodes, all the same day, none since Rates 110's-130 She had a 1:1 AT and an perhaps and AFlutter back in 2020    12/07/2016: TTE Study Conclusions  - Left ventricle: The cavity size was normal. Wall thickness was  increased in a pattern of mild LVH. Systolic function was normal.  The estimated ejection fraction was in the range of 60% to 65%.  Wall motion was normal; there were no regional wall motion  abnormalities. The study is not  technically sufficient to allow  evaluation of LV diastolic function.  - Aortic valve: There was trivial regurgitation.  - Mitral valve: There was mild regurgitation.  - Atrial septum: No defect or patent foramen ovale was identified.  - Tricuspid valve: There was mild-moderate regurgitation.  - Pulmonary arteries: PA peak pressure: 32 mm Hg (S).    12/15/2014: LHC  Prox RCA lesion, 20% stenosed.  Ost LAD lesion, 20% stenosed.  Ost LAD to Prox LAD lesion, 20% stenosed.   1. Mild non-obstructive CAD 2. Normal LV systolic function    Recent Labs: No results found for requested labs within last 8760 hours.  No results found for requested labs within last 8760 hours.   CrCl cannot be calculated (Patient's most recent lab result is older than the maximum 21 days allowed.).   Wt Readings from Last 3 Encounters:  10/17/20 147 lb 9.6 oz (67 kg)  05/31/19 148 lb (67.1 kg)  08/13/18 146 lb (66.2 kg)     Other studies reviewed: Additional studies/records reviewed today include: summarized above  ASSESSMENT AND PLAN:  1. PPM     Intact function, no programming changes made  2. Atach     On propafenone     Intervals look OK, she has pacing support  3. Paroxysmal Afib     CHA2DS2Vac is 4      7 hour episode with 2 shorter, all on the same day Burden <0.3% We discussed at length AFib, her stroke  risk associated with AFib, blood thinner therapy and rational. She is under quite a bit of stress and she thinks this plays a role, niot unreasonable, though we discussed AFib as a function with aging as well.  For now, after discussion with her, will hold off on [facility], monitor her burden I am adding Toprol XL 25mg  daily She will follow up with Dr. Caryl Comes as already scheduled in may  I encouraged her to discuss her personal stressors and any recommendations her PMD may have, even in the short term, she is getting grief counseling   4. HTN     Looks OK, should tolerate the BB  5. HLD     Not addressed today  Disposition: F/u as above  Current medicines are reviewed at length with the patient today.  The patient did not have any concerns regarding medicines.  Venetia Night, PA-C 10/17/2020 2:21 PM     [address] [place] [address] [phone] (office)  [phone] (fax)

## 2020-10-17 ENCOUNTER — Other Ambulatory Visit: Payer: Self-pay

## 2020-10-17 ENCOUNTER — Encounter: Payer: Self-pay | Admitting: Physician Assistant

## 2020-10-17 ENCOUNTER — Ambulatory Visit (INDEPENDENT_AMBULATORY_CARE_PROVIDER_SITE_OTHER): Payer: Medicare Other | Admitting: Physician Assistant

## 2020-10-17 VITALS — BP 132/76 | HR 72 | Ht 66.0 in | Wt 147.6 lb

## 2020-10-17 DIAGNOSIS — I495 Sick sinus syndrome: Secondary | ICD-10-CM | POA: Diagnosis not present

## 2020-10-17 DIAGNOSIS — I480 Paroxysmal atrial fibrillation: Secondary | ICD-10-CM

## 2020-10-17 DIAGNOSIS — Z950 Presence of cardiac pacemaker: Secondary | ICD-10-CM

## 2020-10-17 DIAGNOSIS — I10 Essential (primary) hypertension: Secondary | ICD-10-CM | POA: Diagnosis not present

## 2020-10-17 LAB — CUP PACEART INCLINIC DEVICE CHECK
Battery Remaining Longevity: 126 mo
Battery Voltage: 3 V
Brady Statistic AP VP Percent: 0.1 %
Brady Statistic AP VS Percent: 56.81 %
Brady Statistic AS VP Percent: 0.01 %
Brady Statistic AS VS Percent: 43.08 %
Brady Statistic RA Percent Paced: 57.14 %
Brady Statistic RV Percent Paced: 0.16 %
Date Time Interrogation Session: 20220309160209
Implantable Lead Implant Date: 20180709
Implantable Lead Implant Date: 20180709
Implantable Lead Location: 753859
Implantable Lead Location: 753860
Implantable Lead Model: 3830
Implantable Lead Model: 5076
Implantable Pulse Generator Implant Date: 20180709
Lead Channel Impedance Value: 266 Ohm
Lead Channel Impedance Value: 342 Ohm
Lead Channel Impedance Value: 380 Ohm
Lead Channel Impedance Value: 456 Ohm
Lead Channel Pacing Threshold Amplitude: 0.5 V
Lead Channel Pacing Threshold Pulse Width: 0.4 ms
Lead Channel Sensing Intrinsic Amplitude: 1.125 mV
Lead Channel Sensing Intrinsic Amplitude: 1.375 mV
Lead Channel Sensing Intrinsic Amplitude: 10.5 mV
Lead Channel Sensing Intrinsic Amplitude: 8.75 mV
Lead Channel Setting Pacing Amplitude: 1.5 V
Lead Channel Setting Pacing Amplitude: 2.5 V
Lead Channel Setting Pacing Pulse Width: 1 ms
Lead Channel Setting Sensing Sensitivity: 2 mV

## 2020-10-17 MED ORDER — METOPROLOL SUCCINATE ER 25 MG PO TB24: 25.0000 mg | ORAL_TABLET | Freq: Every day | ORAL | 3 refills | Status: DC

## 2020-10-17 MED ORDER — DILTIAZEM HCL 30 MG PO TABS: ORAL_TABLET | ORAL | 3 refills | Status: AC

## 2020-10-17 NOTE — Patient Instructions (Signed)
Medication Instructions:   START TAKING TOPROL XL 25 MG ONCE A DAY   *If you need a refill on your cardiac medications before your next appointment, please call your pharmacy*   Lab Work: NONE ORDERED  TODAY   If you have labs (blood work) drawn today and your tests are completely normal, you will receive your results only by: [name] MyChart Message (if you have MyChart) OR . A paper copy in the mail If you have any lab test that is abnormal or we need to change your treatment, we will call you to review the results.   Testing/Procedures: NONE ORDERED  TODAY    Follow-Up: At [facility], you and your health needs are our priority.  As part of our continuing mission to provide you with exceptional heart care, we have created designated Provider Care Teams.  These Care Teams include your primary Cardiologist (physician) and Advanced Practice Providers (APPs -  Physician Assistants and Nurse Practitioners) who all work together to provide you with the care you need, when you need it.  We recommend signing up for the patient portal called "MyChart".  Sign up information is provided on this After Visit Summary.  MyChart is used to connect with patients for Virtual Visits (Telemedicine).  Patients are able to view lab/test results, encounter notes, upcoming appointments, etc.  Non-urgent messages can be sent to your provider as well.   To learn more about what you can do with MyChart, go to [email or web address].    Your next appointment: AS SCHEDULED   Other Instructions

## 2020-10-23 DIAGNOSIS — L739 Follicular disorder, unspecified: Secondary | ICD-10-CM | POA: Diagnosis not present

## 2020-10-29 DIAGNOSIS — J3489 Other specified disorders of nose and nasal sinuses: Secondary | ICD-10-CM | POA: Diagnosis not present

## 2020-10-29 DIAGNOSIS — I480 Paroxysmal atrial fibrillation: Secondary | ICD-10-CM | POA: Diagnosis not present

## 2020-11-02 DIAGNOSIS — J3489 Other specified disorders of nose and nasal sinuses: Secondary | ICD-10-CM | POA: Diagnosis not present

## 2020-11-05 DIAGNOSIS — J3489 Other specified disorders of nose and nasal sinuses: Secondary | ICD-10-CM | POA: Diagnosis not present

## 2020-11-08 DIAGNOSIS — H1045 Other chronic allergic conjunctivitis: Secondary | ICD-10-CM | POA: Diagnosis not present

## 2020-11-08 DIAGNOSIS — H40013 Open angle with borderline findings, low risk, bilateral: Secondary | ICD-10-CM | POA: Diagnosis not present

## 2020-11-08 DIAGNOSIS — H2512 Age-related nuclear cataract, left eye: Secondary | ICD-10-CM | POA: Diagnosis not present

## 2020-11-08 DIAGNOSIS — Z961 Presence of intraocular lens: Secondary | ICD-10-CM | POA: Diagnosis not present

## 2020-11-20 ENCOUNTER — Ambulatory Visit (INDEPENDENT_AMBULATORY_CARE_PROVIDER_SITE_OTHER): Payer: Medicare Other

## 2020-11-20 DIAGNOSIS — J310 Chronic rhinitis: Secondary | ICD-10-CM | POA: Diagnosis not present

## 2020-11-20 DIAGNOSIS — I495 Sick sinus syndrome: Secondary | ICD-10-CM | POA: Diagnosis not present

## 2020-11-20 DIAGNOSIS — L0109 Other impetigo: Secondary | ICD-10-CM | POA: Diagnosis not present

## 2020-11-20 LAB — CUP PACEART REMOTE DEVICE CHECK
Battery Remaining Longevity: 124 mo
Battery Voltage: 3 V
Brady Statistic AP VP Percent: 0.32 %
Brady Statistic AP VS Percent: 68.73 %
Brady Statistic AS VP Percent: 0 %
Brady Statistic AS VS Percent: 30.95 %
Brady Statistic RA Percent Paced: 74.44 %
Brady Statistic RV Percent Paced: 0.32 %
Date Time Interrogation Session: 20220412041918
Implantable Lead Implant Date: 20180709
Implantable Lead Implant Date: 20180709
Implantable Lead Location: 753859
Implantable Lead Location: 753860
Implantable Lead Model: 3830
Implantable Lead Model: 5076
Implantable Pulse Generator Implant Date: 20180709
Lead Channel Impedance Value: 266 Ohm
Lead Channel Impedance Value: 304 Ohm
Lead Channel Impedance Value: 361 Ohm
Lead Channel Impedance Value: 437 Ohm
Lead Channel Pacing Threshold Amplitude: 0.5 V
Lead Channel Pacing Threshold Pulse Width: 0.4 ms
Lead Channel Sensing Intrinsic Amplitude: 1.5 mV
Lead Channel Sensing Intrinsic Amplitude: 1.5 mV
Lead Channel Sensing Intrinsic Amplitude: 9 mV
Lead Channel Sensing Intrinsic Amplitude: 9 mV
Lead Channel Setting Pacing Amplitude: 1.5 V
Lead Channel Setting Pacing Amplitude: 2.5 V
Lead Channel Setting Pacing Pulse Width: 1 ms
Lead Channel Setting Sensing Sensitivity: 2 mV

## 2020-12-05 NOTE — Progress Notes (Signed)
Remote pacemaker transmission.   

## 2020-12-06 DIAGNOSIS — L308 Other specified dermatitis: Secondary | ICD-10-CM | POA: Diagnosis not present

## 2020-12-06 DIAGNOSIS — L0889 Other specified local infections of the skin and subcutaneous tissue: Secondary | ICD-10-CM | POA: Diagnosis not present

## 2020-12-06 DIAGNOSIS — Z950 Presence of cardiac pacemaker: Secondary | ICD-10-CM | POA: Insufficient documentation

## 2020-12-06 DIAGNOSIS — L0101 Non-bullous impetigo: Secondary | ICD-10-CM | POA: Diagnosis not present

## 2020-12-19 ENCOUNTER — Ambulatory Visit (INDEPENDENT_AMBULATORY_CARE_PROVIDER_SITE_OTHER): Payer: Medicare Other | Admitting: Internal Medicine

## 2020-12-19 ENCOUNTER — Other Ambulatory Visit: Payer: Self-pay

## 2020-12-19 ENCOUNTER — Encounter: Payer: Self-pay | Admitting: Internal Medicine

## 2020-12-19 VITALS — BP 150/82 | HR 72 | Ht 66.0 in | Wt 143.8 lb

## 2020-12-19 DIAGNOSIS — I495 Sick sinus syndrome: Secondary | ICD-10-CM | POA: Diagnosis not present

## 2020-12-19 DIAGNOSIS — I471 Supraventricular tachycardia: Secondary | ICD-10-CM | POA: Diagnosis not present

## 2020-12-19 DIAGNOSIS — I480 Paroxysmal atrial fibrillation: Secondary | ICD-10-CM | POA: Diagnosis not present

## 2020-12-19 DIAGNOSIS — Z950 Presence of cardiac pacemaker: Secondary | ICD-10-CM

## 2020-12-19 DIAGNOSIS — I4719 Other supraventricular tachycardia: Secondary | ICD-10-CM

## 2020-12-19 MED ORDER — PROPAFENONE HCL 150 MG PO TABS: ORAL_TABLET | ORAL | 3 refills | Status: DC

## 2020-12-19 MED ORDER — AMLODIPINE BESYLATE 5 MG PO TABS: 5.0000 mg | ORAL_TABLET | Freq: Every day | ORAL | 3 refills | Status: DC

## 2020-12-19 NOTE — Progress Notes (Deleted)
Patient Care Team: Lajean Manes, MD as PCP - General (Internal Medicine)   HPI  Katherine Cardenas is a 80 y.o. female Seen in follow-up for a history of atrial arrhythmia and sinus node dysfunction.s/p Pacer 7/18  The latter had prompted the discontinuation of diltiazem about 4 years ago. When she was in the hospital she was having bradycardia again and was elected to discontinue her propafenone. With that there was normalization of her heart rate.  She had been hospitalized 4/18 for syncope associated with bradycardia   Cardiac catheterization 5/16 demonstrated mild nonobstructive coronary disease  Echocardiogram was normal 4/18    Lots of stress her husband intercurrently developed cancer and has died.  Some home breakdown issues.  Interval atrial fibrillation.  Saw her in RU--PA 3/22 discussed but did not initiate anticoagulation.  Takes low-dose aspirin  She takes propafenone***as needed  DATE PR interval QRSduration Propafenone  5/18  140 92 0  3/19 160 96 150  10/19 Ap 148 96 150 (2x)   Date TC LDL  7/18 194 105           Thromboembolic risk factors ( age  -2, HTN-1, Gender-1) for a CHADSVASc Score of 4   Past Medical History:  Diagnosis Date  . Anxiety   . Atrial fibrillation ([place])   . Atrial tachycardia ([place])   . Depression   . Facial spasm   . Hypertension   . Tachy-brady syndrome [facility])    a. s/p MDT His Bundle pacemaker implant 2018 - Dr Caryl Comes    Past Surgical History:  Procedure Laterality Date  . APPENDECTOMY     age 37  . CARDIAC CATHETERIZATION N/A 12/15/2014   Procedure: Left Heart Cath and Coronary Angiography;  Surgeon: Burnell Blanks, MD;  Location: [place] [facility];  Service: Cardiovascular;  Laterality: N/A;  . CATARACT EXTRACTION     Right Eye  . DILATION AND CURETTAGE OF UTERUS  1990's   benign polyp  . PACEMAKER IMPLANT N/A 02/16/2017   Procedure: Pacemaker Implant;  Surgeon: Deboraha Sprang, MD;  Location: [place] [facility]  LAB;  Service: Cardiovascular;  Laterality: N/A;  . TONSILLECTOMY AND ADENOIDECTOMY     --age 67    Current Outpatient Medications  Medication Sig Dispense Refill  . amLODipine (NORVASC) 2.5 MG tablet Take 2.5 mg by mouth daily.    . Ascorbic Acid (VITAMIN C) 1000 MG tablet Take 500 mg by mouth daily before supper.     [name] aspirin EC 81 MG tablet Take 81 mg by mouth daily.    . cholecalciferol (VITAMIN D) 1000 UNITS tablet Take 1,000 Units by mouth at bedtime.     . Coenzyme Q10 200 MG capsule Take 200 mg by mouth daily.    . Cranberry 500 MG TABS Take 500 mg by mouth daily.    [name] diltiazem (CARDIZEM) 30 MG tablet TAKE 1 TABLET DAILY AS NEEDED. FOR HEART RATE>100. 90 tablet 3  . Diphenhyd-Calamine-Benzyl Alc (IVAREST POISON IVY ITCH EX) Apply 1 application topically daily as needed (itching).    . LORazepam (ATIVAN) 0.5 MG tablet Take 0.25 mg by mouth 2 (two) times daily.    . Magnesium 400 MG CAPS Take 400 mg by mouth daily.    . metoprolol succinate (TOPROL XL) 25 MG 24 hr tablet Take 1 tablet (25 mg total) by mouth daily. 90 tablet 3  . nitroGLYCERIN (NITROSTAT) 0.4 MG SL tablet Place 1 tablet (0.4 mg total) under the  tongue every 5 (five) minutes x 3 doses as needed for chest pain. 25 tablet 3  . Polyethyl Glycol-Propyl Glycol (SYSTANE OP) Place 1 drop into both eyes 2 (two) times daily.    . Probiotic Product (PROBIOTIC COMPLEX ACIDOPHILUS) CAPS Take 1 tablet by mouth daily. "Enzyme Probiotic Complex"    . propafenone (RYTHMOL) 150 MG tablet TAKE 1 TABLET BY MOUTH EVERY 12 HOURS MAY USE EVERY 8 HOURS AS NEEDED FOR PALPITATIONS. NEEDS OV 270 tablet 3  . Turmeric 500 MG CAPS Take 500 mg by mouth at bedtime.    . vitamin B-12 (CYANOCOBALAMIN) 1000 MCG tablet Take 1,000 mcg by mouth every other day.     No current facility-administered medications for this visit.    Allergies  Allergen Reactions  . Augmentin [Amoxicillin-Pot Clavulanate] Hives  . Ciprofloxacin Palpitations and Other (See  Comments)    "irregular, rapid heart beat"  . Diltiazem Other (See Comments)    Pulse dropped too low  . Epinephrine Other (See Comments)    "NOVOCAIN"--could be the epinephrine----patient didn't tolerate-SYNCOPE    . Novocain [Procaine] Other (See Comments)    could be the epinephrine----patient didn't tolerate-SYNCOPE  . Avapro [Irbesartan] Swelling  . Shrimp [Shellfish Allergy] Hives  . Iohexol Itching and Rash     Code: RASH, Desc: patient states hx of possible reaction to ivp contrast years ago   . Penicillins Hives    Has patient had a PCN reaction causing immediate rash, facial/tongue/throat swelling, SOB or lightheadedness with hypotension: Yes Has patient had a PCN reaction causing severe rash involving mucus membranes or skin necrosis: No Has patient had a PCN reaction that required hospitalization No Has patient had a PCN reaction occurring within the last 10 years: No If all of the above answers are "NO", then may proceed with Cephalosporin use.  . Zithromax [Azithromycin] Other (See Comments)    Cannot take with propafenone      Review of Systems negative except from HPI and PMH  Physical Exam BP (!) 150/82   Pulse 72   Ht 5\' 6"  (1.676 m)   Wt 143 lb 12.8 oz (65.2 kg)   LMP 08/11/1988 (Approximate)   SpO2 96%   BMI 23.21 kg/m  Well developed and nourished in no acute distress HENT normal Neck supple with JVP-flat Clear Regular rate and rhythm, no murmurs or gallops Abd-soft with active BS No Clubbing cyanosis edema Skin-warm and dry A & Oriented  Grossly normal sensory and motor function   ECG  apacing 67 15/10/40  Assessment and  Plan  Syncope  Atrial fibrillation/tachycardia  SCAF  Sinus bradycardia  Pacemaker   [employer]  Hypertension  Hyperlipidemia    No interval syncope  No interval atrial fibrillation  Blood pressure reasonably controlled  She continues to take low-dose aspirin for her arthritis   Current medicines are  reviewed at length with the patient today .  The patient does not  have concerns regarding medicines.

## 2020-12-19 NOTE — Progress Notes (Signed)
Patient Care Team: Lajean Manes, MD as PCP - General (Internal Medicine)   HPI  Katherine Cardenas is a 80 y.o. female Seen in follow-up for a history of atrial arrhythmia and sinus node dysfunction.s/p Pacer 7/18  The latter had prompted the discontinuation of diltiazem about 4 years ago. When she was in the hospital she was having bradycardia again and was elected to discontinue her propafenone. With that there was normalization of her heart rate.  She had been hospitalized 4/18 for syncope associated with bradycardia   Cardiac catheterization 5/16 demonstrated mild nonobstructive coronary disease  Echocardiogram was normal 4/18  Lots of stress her husband intercurrently developed bladder cancer and has died in 2020-05-17.  Some home breakdown issues.  Interval atrial fibrillation.  Saw her in [facility]--[facility] 3/22 discussed but did not initiate anticoagulation.  Takes low-dose aspirin     Today, she reports that after her husbands death she had a house leak issue that she has been taking care of for about 4 months now. She recognizes that her house issues have interrupted her grievance process. Her at home blood pressure average is 130-134/80.  She denies any chest pain, tightness, or pressure, lightheadedness, orthopnea, pre-syncopal, or syncopal episodes.     DATE PR interval QRSduration Propafenone  5/18  140 92 0  3/19 160 96 150  10/19 Ap 148 96 150 (2x)  5/22 Ap 200 106 150 (2.5X)   Date TC LDL  7/18 194 105         Thromboembolic risk factors ( age  -2, HTN-1, Gender-1) for a CHADSVASc Score of 4   Past Medical History:  Diagnosis Date  . Anxiety   . Atrial fibrillation ([place])   . Atrial tachycardia ([place])   . Depression   . Facial spasm   . Hypertension   . Tachy-brady syndrome [facility])    a. s/p MDT His Bundle pacemaker implant 2018 - Dr Caryl Comes    Past Surgical History:  Procedure Laterality Date  . APPENDECTOMY     age 48  . CARDIAC CATHETERIZATION N/A  12/15/2014   Procedure: Left Heart Cath and Coronary Angiography;  Surgeon: Burnell Blanks, MD;  Location: [place] CV LAB;  Service: Cardiovascular;  Laterality: N/A;  . CATARACT EXTRACTION     Right Eye  . DILATION AND CURETTAGE OF UTERUS  1990's   benign polyp  . PACEMAKER IMPLANT N/A 02/16/2017   Procedure: Pacemaker Implant;  Surgeon: Deboraha Sprang, MD;  Location: [place] CV LAB;  Service: Cardiovascular;  Laterality: N/A;  . TONSILLECTOMY AND ADENOIDECTOMY     --age 50    Current Outpatient Medications  Medication Sig Dispense Refill  . amLODipine (NORVASC) 2.5 MG tablet Take 2.5 mg by mouth daily.    . Ascorbic Acid (VITAMIN C) 1000 MG tablet Take 500 mg by mouth daily before supper.     [name] aspirin EC 81 MG tablet Take 81 mg by mouth daily.    . cholecalciferol (VITAMIN D) 1000 UNITS tablet Take 1,000 Units by mouth at bedtime.     . Coenzyme Q10 200 MG capsule Take 200 mg by mouth daily.    . Cranberry 500 MG TABS Take 500 mg by mouth daily.    [name] diltiazem (CARDIZEM) 30 MG tablet TAKE 1 TABLET DAILY AS NEEDED. FOR HEART RATE>100. 90 tablet 3  . Diphenhyd-Calamine-Benzyl Alc (IVAREST POISON IVY ITCH EX) Apply 1 application topically daily as needed (itching).    [name]  LORazepam (ATIVAN) 0.5 MG tablet Take 0.25 mg by mouth 2 (two) times daily.    . Magnesium 400 MG CAPS Take 400 mg by mouth daily.    . metoprolol succinate (TOPROL XL) 25 MG 24 hr tablet Take 1 tablet (25 mg total) by mouth daily. 90 tablet 3  . nitroGLYCERIN (NITROSTAT) 0.4 MG SL tablet Place 1 tablet (0.4 mg total) under the tongue every 5 (five) minutes x 3 doses as needed for chest pain. 25 tablet 3  . Polyethyl Glycol-Propyl Glycol (SYSTANE OP) Place 1 drop into both eyes 2 (two) times daily.    . Probiotic Product (PROBIOTIC COMPLEX ACIDOPHILUS) CAPS Take 1 tablet by mouth daily. "Enzyme Probiotic Complex"    . propafenone (RYTHMOL) 150 MG tablet TAKE 1 TABLET BY MOUTH EVERY 12 HOURS MAY USE EVERY 8  HOURS AS NEEDED FOR PALPITATIONS. NEEDS OV 270 tablet 3  . Turmeric 500 MG CAPS Take 500 mg by mouth at bedtime.    . vitamin B-12 (CYANOCOBALAMIN) 1000 MCG tablet Take 1,000 mcg by mouth every other day.     No current facility-administered medications for this visit.    Allergies  Allergen Reactions  . Augmentin [Amoxicillin-Pot Clavulanate] Hives  . Ciprofloxacin Palpitations and Other (See Comments)    "irregular, rapid heart beat"  . Diltiazem Other (See Comments)    Pulse dropped too low  . Epinephrine Other (See Comments)    "NOVOCAIN"--could be the epinephrine----patient didn't tolerate-SYNCOPE    . Novocain [Procaine] Other (See Comments)    could be the epinephrine----patient didn't tolerate-SYNCOPE  . Avapro [Irbesartan] Swelling  . Shrimp [Shellfish Allergy] Hives  . Iohexol Itching and Rash     Code: RASH, Desc: patient states hx of possible reaction to ivp contrast years ago   . Penicillins Hives    Has patient had a PCN reaction causing immediate rash, facial/tongue/throat swelling, SOB or lightheadedness with hypotension: Yes Has patient had a PCN reaction causing severe rash involving mucus membranes or skin necrosis: No Has patient had a PCN reaction that required hospitalization No Has patient had a PCN reaction occurring within the last 10 years: No If all of the above answers are "NO", then may proceed with Cephalosporin use.  . Zithromax [Azithromycin] Other (See Comments)    Cannot take with propafenone      Review of Systems negative except from HPI and PMH  Physical Exam BP (!) 150/82   Pulse 72   Ht 5\' 6"  (1.676 m)   Wt 143 lb 12.8 oz (65.2 kg)   LMP 08/11/1988 (Approximate)   SpO2 96%   BMI 23.21 kg/m  Well developed and well nourished in no acute distress HENT normal Neck supple with JVP-6 Clear Device pocket well healed; without hematoma or erythema.  There is no tethering  Regular rate and rhythm, no  murmur Abd-soft with active  BS No Clubbing cyanosis tr edema Skin-warm and dry A & Oriented  Grossly normal sensory and motor function  ECG atrial pacing at 72 Interval 20/11/39  Assessment and  Plan  Syncope  Atrial fibrillation/tachycardia  SCAF  Sinus bradycardia  Pacemaker   Medtronic  High Risk Medication Surveillance   Hypertension  Hyperlipidemia    No interval syncope.  Interval atrial fibrillation of relatively brief, less than 6 to 8 hours duration.  At this point we will not anticoagulate given the brevity of the SCAF she takes  Propafenone having increased the dose from 150 twice daily to 150/75/150.  This may  account for the lengthening of the AP VS interval as well as the QRS duration  Will discontinue aspirin.    Blood pressures are borderline elevated at home.  We will increase her amlodipine from 2.5--5 mg daily.  S.  I,Alexis Bryant,acting as a scribe for Virl Axe, MD.,have documented all relevant documentation on the behalf of Virl Axe, MD,as directed by  Virl Axe, MD while in the presence of Virl Axe, MD. I, Virl Axe, MD, have reviewed all documentation for this visit. The documentation on 12/19/20 for the exam, diagnosis, procedures, and orders are all accurate and complete.

## 2020-12-19 NOTE — Patient Instructions (Signed)
Medication Instructions:  Your physician has recommended you make the following change in your medication:   ** Begin Amlodipine 5mg  - 1 tablet by mouth daily.  *If you need a refill on your cardiac medications before your next appointment, please call your pharmacy*   Lab Work: None ordered.  If you have labs (blood work) drawn today and your tests are completely normal, you will receive your results only by: [name] MyChart Message (if you have MyChart) OR . A paper copy in the mail If you have any lab test that is abnormal or we need to change your treatment, we will call you to review the results.   Testing/Procedures: None ordered.    Follow-Up: At [facility], you and your health needs are our priority.  As part of our continuing mission to provide you with exceptional heart care, we have created designated Provider Care Teams.  These Care Teams include your primary Cardiologist (physician) and Advanced Practice Providers (APPs -  Physician Assistants and Nurse Practitioners) who all work together to provide you with the care you need, when you need it.  We recommend signing up for the patient portal called "MyChart".  Sign up information is provided on this After Visit Summary.  MyChart is used to connect with patients for Virtual Visits (Telemedicine).  Patients are able to view lab/test results, encounter notes, upcoming appointments, etc.  Non-urgent messages can be sent to your provider as well.   To learn more about what you can do with MyChart, go to [email or web address].    Your next appointment:   6 month(s)  The format for your next appointment:   In Person  Provider:   Virl Axe, MD

## 2020-12-28 DIAGNOSIS — R269 Unspecified abnormalities of gait and mobility: Secondary | ICD-10-CM | POA: Diagnosis not present

## 2020-12-28 DIAGNOSIS — F411 Generalized anxiety disorder: Secondary | ICD-10-CM | POA: Diagnosis not present

## 2020-12-28 DIAGNOSIS — I10 Essential (primary) hypertension: Secondary | ICD-10-CM | POA: Diagnosis not present

## 2020-12-28 DIAGNOSIS — Z950 Presence of cardiac pacemaker: Secondary | ICD-10-CM | POA: Diagnosis not present

## 2020-12-28 DIAGNOSIS — I480 Paroxysmal atrial fibrillation: Secondary | ICD-10-CM | POA: Diagnosis not present

## 2020-12-31 DIAGNOSIS — E7800 Pure hypercholesterolemia, unspecified: Secondary | ICD-10-CM | POA: Diagnosis not present

## 2020-12-31 DIAGNOSIS — M810 Age-related osteoporosis without current pathological fracture: Secondary | ICD-10-CM | POA: Diagnosis not present

## 2020-12-31 DIAGNOSIS — I480 Paroxysmal atrial fibrillation: Secondary | ICD-10-CM | POA: Diagnosis not present

## 2020-12-31 DIAGNOSIS — I10 Essential (primary) hypertension: Secondary | ICD-10-CM | POA: Diagnosis not present

## 2020-12-31 DIAGNOSIS — F325 Major depressive disorder, single episode, in full remission: Secondary | ICD-10-CM | POA: Diagnosis not present

## 2021-01-28 DIAGNOSIS — R350 Frequency of micturition: Secondary | ICD-10-CM | POA: Diagnosis not present

## 2021-01-28 DIAGNOSIS — F325 Major depressive disorder, single episode, in full remission: Secondary | ICD-10-CM | POA: Diagnosis not present

## 2021-01-28 DIAGNOSIS — I480 Paroxysmal atrial fibrillation: Secondary | ICD-10-CM | POA: Diagnosis not present

## 2021-01-28 DIAGNOSIS — M810 Age-related osteoporosis without current pathological fracture: Secondary | ICD-10-CM | POA: Diagnosis not present

## 2021-01-28 DIAGNOSIS — I10 Essential (primary) hypertension: Secondary | ICD-10-CM | POA: Diagnosis not present

## 2021-01-28 DIAGNOSIS — E7800 Pure hypercholesterolemia, unspecified: Secondary | ICD-10-CM | POA: Diagnosis not present

## 2021-01-29 DIAGNOSIS — Z79899 Other long term (current) drug therapy: Secondary | ICD-10-CM | POA: Diagnosis not present

## 2021-01-29 DIAGNOSIS — I480 Paroxysmal atrial fibrillation: Secondary | ICD-10-CM | POA: Diagnosis not present

## 2021-01-29 DIAGNOSIS — R350 Frequency of micturition: Secondary | ICD-10-CM | POA: Diagnosis not present

## 2021-01-29 DIAGNOSIS — R410 Disorientation, unspecified: Secondary | ICD-10-CM | POA: Diagnosis not present

## 2021-01-29 DIAGNOSIS — Z950 Presence of cardiac pacemaker: Secondary | ICD-10-CM | POA: Diagnosis not present

## 2021-02-04 ENCOUNTER — Other Ambulatory Visit: Payer: Self-pay

## 2021-02-04 ENCOUNTER — Other Ambulatory Visit (HOSPITAL_BASED_OUTPATIENT_CLINIC_OR_DEPARTMENT_OTHER): Payer: Self-pay

## 2021-02-04 ENCOUNTER — Ambulatory Visit: Payer: Medicare Other | Attending: Internal Medicine

## 2021-02-04 DIAGNOSIS — Z23 Encounter for immunization: Secondary | ICD-10-CM

## 2021-02-04 MED ORDER — PFIZER-BIONT COVID-19 VAC-TRIS 30 MCG/0.3ML IM SUSP
INTRAMUSCULAR | 0 refills | Status: DC
  Filled 2021-02-04: qty 0.3, 1d supply, fill #0

## 2021-02-04 NOTE — Progress Notes (Signed)
   Covid-19 Vaccination Clinic  Name:  FLOIS MCTAGUE    MRN: [id number] DOB: [birth year]  02/04/2021  Ms. Kutscher was observed post Covid-19 immunization for 15 minutes without incident. She was provided with Vaccine Information Sheet and instruction to access the V-Safe system.   Ms. Rogalski was instructed to call 911 with any severe reactions post vaccine: Difficulty breathing  Swelling of face and throat  A fast heartbeat  A bad rash all over body  Dizziness and weakness   Immunizations Administered     Name Date Dose VIS Date Route   PFIZER Comrnaty(Gray TOP) Covid-19 Vaccine 02/04/2021 10:47 AM 0.3 mL 07/19/2020 Intramuscular   Manufacturer: [place]   Lot: [id number]   [place]: [phone]

## 2021-02-19 ENCOUNTER — Ambulatory Visit (INDEPENDENT_AMBULATORY_CARE_PROVIDER_SITE_OTHER): Payer: Medicare Other

## 2021-02-19 DIAGNOSIS — I495 Sick sinus syndrome: Secondary | ICD-10-CM | POA: Diagnosis not present

## 2021-02-19 LAB — CUP PACEART REMOTE DEVICE CHECK
Battery Remaining Longevity: 121 mo
Battery Voltage: 3 V
Brady Statistic AP VP Percent: 0.15 %
Brady Statistic AP VS Percent: 62.85 %
Brady Statistic AS VP Percent: 0.01 %
Brady Statistic AS VS Percent: 37 %
Brady Statistic RA Percent Paced: 65.53 %
Brady Statistic RV Percent Paced: 0.15 %
Date Time Interrogation Session: 20220711235514
Implantable Lead Implant Date: 20180709
Implantable Lead Implant Date: 20180709
Implantable Lead Location: 753859
Implantable Lead Location: 753860
Implantable Lead Model: 3830
Implantable Lead Model: 5076
Implantable Pulse Generator Implant Date: 20180709
Lead Channel Impedance Value: 266 Ohm
Lead Channel Impedance Value: 304 Ohm
Lead Channel Impedance Value: 361 Ohm
Lead Channel Impedance Value: 437 Ohm
Lead Channel Pacing Threshold Amplitude: 0.5 V
Lead Channel Pacing Threshold Pulse Width: 0.4 ms
Lead Channel Sensing Intrinsic Amplitude: 2 mV
Lead Channel Sensing Intrinsic Amplitude: 2 mV
Lead Channel Sensing Intrinsic Amplitude: 9 mV
Lead Channel Sensing Intrinsic Amplitude: 9 mV
Lead Channel Setting Pacing Amplitude: 1.5 V
Lead Channel Setting Pacing Amplitude: 2.5 V
Lead Channel Setting Pacing Pulse Width: 1 ms
Lead Channel Setting Sensing Sensitivity: 2 mV

## 2021-02-28 DIAGNOSIS — F411 Generalized anxiety disorder: Secondary | ICD-10-CM | POA: Diagnosis not present

## 2021-02-28 DIAGNOSIS — I10 Essential (primary) hypertension: Secondary | ICD-10-CM | POA: Diagnosis not present

## 2021-03-05 ENCOUNTER — Emergency Department (HOSPITAL_BASED_OUTPATIENT_CLINIC_OR_DEPARTMENT_OTHER)
Admission: EM | Admit: 2021-03-05 | Discharge: 2021-03-05 | Disposition: A | Discharge status: Home / Self Care | Payer: Medicare Other | Attending: Emergency Medicine | Admitting: Emergency Medicine

## 2021-03-05 ENCOUNTER — Emergency Department (HOSPITAL_BASED_OUTPATIENT_CLINIC_OR_DEPARTMENT_OTHER): Payer: Medicare Other

## 2021-03-05 ENCOUNTER — Encounter (HOSPITAL_BASED_OUTPATIENT_CLINIC_OR_DEPARTMENT_OTHER): Payer: Self-pay | Admitting: Emergency Medicine

## 2021-03-05 ENCOUNTER — Other Ambulatory Visit: Payer: Self-pay

## 2021-03-05 DIAGNOSIS — G454 Transient global amnesia: Secondary | ICD-10-CM | POA: Insufficient documentation

## 2021-03-05 DIAGNOSIS — I2510 Atherosclerotic heart disease of native coronary artery without angina pectoris: Secondary | ICD-10-CM | POA: Insufficient documentation

## 2021-03-05 DIAGNOSIS — Z950 Presence of cardiac pacemaker: Secondary | ICD-10-CM | POA: Diagnosis not present

## 2021-03-05 DIAGNOSIS — R4182 Altered mental status, unspecified: Secondary | ICD-10-CM | POA: Diagnosis not present

## 2021-03-05 DIAGNOSIS — Z7982 Long term (current) use of aspirin: Secondary | ICD-10-CM | POA: Insufficient documentation

## 2021-03-05 DIAGNOSIS — Z79899 Other long term (current) drug therapy: Secondary | ICD-10-CM | POA: Insufficient documentation

## 2021-03-05 DIAGNOSIS — R413 Other amnesia: Secondary | ICD-10-CM

## 2021-03-05 DIAGNOSIS — I10 Essential (primary) hypertension: Secondary | ICD-10-CM | POA: Insufficient documentation

## 2021-03-05 LAB — COMPREHENSIVE METABOLIC PANEL
ALT: 14 U/L (ref 0–44)
AST: 19 U/L (ref 15–41)
Albumin: 4.2 g/dL (ref 3.5–5.0)
Alkaline Phosphatase: 48 U/L (ref 38–126)
Anion gap: 8 (ref 5–15)
BUN: 12 mg/dL (ref 8–23)
CO2: 25 mmol/L (ref 22–32)
Calcium: 9.6 mg/dL (ref 8.9–10.3)
Chloride: 105 mmol/L (ref 98–111)
Creatinine, Ser: 0.77 mg/dL (ref 0.44–1.00)
GFR, Estimated: >60 mL/min (ref >60)
Glucose, Bld: 117 mg/dL — ABNORMAL HIGH (ref 70–99)
Potassium: 4.1 mmol/L (ref 3.5–5.1)
Sodium: 138 mmol/L (ref 135–145)
Total Bilirubin: 1.1 mg/dL (ref 0.3–1.2)
Total Protein: 6.9 g/dL (ref 6.5–8.1)

## 2021-03-05 LAB — CBC
HCT: 45.4 % (ref 36.0–46.0)
Hemoglobin: 15.2 g/dL — ABNORMAL HIGH (ref 12.0–15.0)
MCH: 31 pg (ref 26.0–34.0)
MCHC: 33.5 g/dL (ref 30.0–36.0)
MCV: 92.5 fL (ref 80.0–100.0)
Platelets: 237 10*3/uL (ref 150–400)
RBC: 4.91 MIL/uL (ref 3.87–5.11)
RDW: 13.2 % (ref 11.5–15.5)
WBC: 6 10*3/uL (ref 4.0–10.5)
nRBC: 0 % (ref 0.0–0.2)

## 2021-03-05 LAB — URINALYSIS, ROUTINE W REFLEX MICROSCOPIC
Bilirubin Urine: NEGATIVE
Glucose, UA: NEGATIVE mg/dL
Hgb urine dipstick: NEGATIVE
Ketones, ur: NEGATIVE mg/dL
Leukocytes,Ua: NEGATIVE
Nitrite: NEGATIVE
Protein, ur: NEGATIVE mg/dL
Specific Gravity, Urine: 1.008 (ref 1.005–1.030)
pH: 7.5 (ref 5.0–8.0)

## 2021-03-05 LAB — CBG MONITORING, ED: Glucose-Capillary: 120 mg/dL — ABNORMAL HIGH (ref 70–99)

## 2021-03-05 NOTE — ED Triage Notes (Signed)
Pt reports awakening around 8am this morning and forgetting small details of her life, which came back to her one by one. Pt states she was still aware of where and who she was.  Pt states similar episode occurred 2 weeks prior.

## 2021-03-05 NOTE — ED Notes (Signed)
ED Provider at bedside. 

## 2021-03-05 NOTE — ED Triage Notes (Signed)
Pt also reports increased nocturia and a small amount of incontinence today.

## 2021-03-05 NOTE — ED Provider Notes (Signed)
[facility] EMERGENCY DEPT Provider Note   CSN: [id number] Arrival date & time: 03/05/21  1254     History Chief Complaint  Patient presents with   Altered Mental Status    HIRA KNITTER is a 80 y.o. female.  [name] patient was monitored she had a period of confusion during which time she forgot when her husband had died of other significant details of her life.  She reports that details started to come back to her, and now she is asymptomatic.  She has been undergoing a lot of stress as she grieves the loss of her husband and deals with the COVID pandemic.  She is still quite isolated, has stopped driving, and finds herself worried about going out due to balance issues.  The history is provided by the patient.  Altered Mental Status Presenting symptoms: memory loss   Severity:  Severe Most recent episode:  Today Episode history:  Single Duration: a few hours. Timing:  Constant Progression:  Resolved Chronicity:  Recurrent (One other episode a few weeks ago. Standing in kitchen and was briefly confused about what she was doing. Today was more severe.) Context: not recent change in medication, not recent illness and not recent infection   Context comment:  Grieving the loss of her husband of almost 65 years who died 10 months ago Associated symptoms: bladder incontinence (urge incontinence, chronic)   Associated symptoms: no abdominal pain, no decreased appetite, no difficulty breathing, no fever, no light-headedness, no nausea, no palpitations, no rash, no seizures, no slurred speech, no visual change, no vomiting and no weakness       Past Medical History:  Diagnosis Date   Anxiety    Atrial fibrillation ([facility])    Atrial tachycardia ([facility])    Depression    Facial spasm    Hypertension    Tachy-brady syndrome ([place])    a. s/p MDT His Bundle pacemaker implant 2018 - Dr Caryl Comes    Patient Active Problem List   Diagnosis Date Noted   Pacemaker  12/06/2020   Syncope 12/07/2016   Sinus node dysfunction ([place]) 12/07/2016   Paroxysmal atrial fibrillation ([place]) 12/07/2016   Hyperlipidemia 01/18/2016   Atrial tachycardia ([place]) 02/13/2015   Essential hypertension 02/13/2015   Chronic coronary artery disease 12/15/2014    Past Surgical History:  Procedure Laterality Date   APPENDECTOMY     age 89   CARDIAC CATHETERIZATION N/A 12/15/2014   Procedure: Left Heart Cath and Coronary Angiography;  Surgeon: Burnell Blanks, MD;  Location: [place] [facility];  Service: Cardiovascular;  Laterality: N/A;   CATARACT EXTRACTION     Right Eye   DILATION AND CURETTAGE OF UTERUS  1990's   benign polyp   PACEMAKER IMPLANT N/A 02/16/2017   Procedure: Pacemaker Implant;  Surgeon: Deboraha Sprang, MD;  Location: [place] CV LAB;  Service: Cardiovascular;  Laterality: N/A;   TONSILLECTOMY AND ADENOIDECTOMY     --age 68     OB History     Gravida  3   Para  3   Term  3   Preterm      AB      Living  3      SAB      IAB      Ectopic      Multiple      Live Births              Family History  Problem Relation Age of Onset  Stomach cancer Father 69   Hypertension Mother    Renal Disease Mother 61   Kidney failure Mother    Hypertension Maternal Grandmother    Heart attack Maternal Grandmother     Social History   Tobacco Use   Smoking status: Never   Smokeless tobacco: Never  Vaping Use   Vaping Use: Never used  Substance Use Topics   Alcohol use: Yes    Alcohol/week: 14.0 standard drinks    Types: 14 Glasses of wine per week   Drug use: No    Home Medications Prior to Admission medications   Medication Sig Start Date End Date Taking? Authorizing Provider  amLODipine (NORVASC) 5 MG tablet Take 1 tablet (5 mg total) by mouth daily. 12/19/20   Deboraha Sprang, MD  Ascorbic Acid (VITAMIN C) 1000 MG tablet Take 500 mg by mouth daily before supper.     [provider]  aspirin EC 81 MG tablet  Take 81 mg by mouth daily.    [provider]  cholecalciferol (VITAMIN D) 1000 UNITS tablet Take 1,000 Units by mouth at bedtime.     [provider]  Coenzyme Q10 200 MG capsule Take 200 mg by mouth daily.    [provider]  COVID-19 mRNA Vac-TriS, Pfizer, (PFIZER-BIONT COVID-19 VAC-TRIS) SUSP injection Inject into the muscle. 02/04/21   Carlyle Basques, MD  Cranberry 500 MG TABS Take 500 mg by mouth daily.    [provider]  diltiazem (CARDIZEM) 30 MG tablet TAKE 1 TABLET DAILY AS NEEDED. FOR HEART RATE>100. 10/17/20   Deboraha Sprang, MD  Diphenhyd-Calamine-Benzyl Alc (IVAREST POISON IVY [facility] EX) Apply 1 application topically daily as needed (itching).    [provider]  LORazepam (ATIVAN) 0.5 MG tablet Take 0.25 mg by mouth 2 (two) times daily.    [provider]  Magnesium 400 MG CAPS Take 400 mg by mouth daily.    [provider]  metoprolol succinate (TOPROL XL) 25 MG 24 hr tablet Take 1 tablet (25 mg total) by mouth daily. 10/17/20   Baldwin Jamaica, PA-C  nitroGLYCERIN (NITROSTAT) 0.4 MG SL tablet Place 1 tablet (0.4 mg total) under the tongue every 5 (five) minutes x 3 doses as needed for chest pain. 05/25/18   Deboraha Sprang, MD  Polyethyl Glycol-Propyl Glycol (SYSTANE OP) Place 1 drop into both eyes 2 (two) times daily.    [provider]  Probiotic Product (PROBIOTIC COMPLEX ACIDOPHILUS) CAPS Take 1 tablet by mouth daily. "Enzyme Probiotic Complex"    [provider]  propafenone (RYTHMOL) 150 MG tablet TAKE 1 TABLET BY MOUTH EVERY 12 HOURS. 12/19/20   Deboraha Sprang, MD  Turmeric 500 MG CAPS Take 500 mg by mouth at bedtime.    [provider]  vitamin B-12 (CYANOCOBALAMIN) 1000 MCG tablet Take 1,000 mcg by mouth every other day.    [provider]    Allergies    Augmentin [amoxicillin-pot clavulanate], Ciprofloxacin, Diltiazem, Epinephrine, Novocain [procaine], Avapro [irbesartan],  Shrimp [shellfish allergy], Iohexol, Penicillins, and Zithromax [azithromycin]  Review of Systems   Review of Systems  Constitutional:  Negative for appetite change, chills, decreased appetite and fever.  HENT:  Negative for ear pain and sore throat.        Left-sided facial twitch that has been off and on for years. Maybe a little worse than normal lately  Eyes:  Negative for pain and visual disturbance.  Respiratory:  Negative for cough and shortness of  breath.   Cardiovascular:  Negative for chest pain and palpitations.  Gastrointestinal:  Negative for abdominal pain, nausea and vomiting.  Genitourinary:  Positive for bladder incontinence (urge incontinence, chronic) and frequency (especially at night- long standing). Negative for dysuria and hematuria.  Musculoskeletal:  Negative for arthralgias and back pain.  Skin:  Negative for color change and rash.  Neurological:  Negative for seizures, syncope, weakness and light-headedness.  Psychiatric/Behavioral:  Positive for memory loss.   All other systems reviewed and are negative.  Physical Exam Updated Vital Signs BP (!) 144/88 (BP Location: Right Arm)   Pulse 65   Temp 98.3 F (36.8 C) (Oral)   Resp 18   LMP 08/11/1988 (Approximate)   SpO2 98%   Physical Exam Vitals and nursing note reviewed.  Constitutional:      Appearance: She is well-developed.  HENT:     Head: Normocephalic and atraumatic.     Nose: Nose normal.     Mouth/Throat:     Mouth: Mucous membranes are moist.  Eyes:     Extraocular Movements: Extraocular movements intact.     Conjunctiva/sclera: Conjunctivae normal.     Pupils: Pupils are equal, round, and reactive to light.  Cardiovascular:     Rate and Rhythm: Normal rate and regular rhythm.     Heart sounds: Normal heart sounds.  Pulmonary:     Effort: Pulmonary effort is normal. No tachypnea.     Breath sounds: Normal breath sounds.  Abdominal:     Palpations: Abdomen is soft.     Tenderness:  There is no abdominal tenderness.  Musculoskeletal:     Right lower leg: No edema.     Left lower leg: No edema.  Skin:    General: Skin is warm and dry.  Neurological:     General: No focal deficit present.     Mental Status: She is alert and oriented to person, place, and time.     Cranial Nerves: No cranial nerve deficit.     Sensory: No sensory deficit.     Motor: No weakness.     Coordination: Coordination normal.  Psychiatric:        Mood and Affect: Mood normal.        Behavior: Behavior normal.    ED Results / Procedures / Treatments   Labs (all labs ordered are listed, but only abnormal results are displayed) Labs Reviewed  COMPREHENSIVE METABOLIC PANEL - Abnormal; Notable for the following components:      Result Value   Glucose, Bld 117 (*)    All other components within normal limits  CBC - Abnormal; Notable for the following components:   Hemoglobin 15.2 (*)    All other components within normal limits  CBG MONITORING, ED - Abnormal; Notable for the following components:   Glucose-Capillary 120 (*)    All other components within normal limits  URINALYSIS, ROUTINE W REFLEX MICROSCOPIC    EKG None  Radiology CT Head Wo Contrast  Result Date: 03/05/2021 CLINICAL DATA:  Altered mental status. EXAM: CT HEAD WITHOUT CONTRAST TECHNIQUE: Contiguous axial images were obtained from the base of the skull through the vertex without intravenous contrast. COMPARISON:  February 20, 2019. FINDINGS: Brain: No evidence of acute infarction, hemorrhage, hydrocephalus, extra-axial collection or mass lesion/mass effect. Vascular: No hyperdense vessel or unexpected calcification. Skull: Normal. Negative for fracture or focal lesion. Sinuses/Orbits: No acute finding. Other: None. IMPRESSION: No acute intracranial abnormality seen. Electronically Signed   By: Bobbe Medico.D.  On: 03/05/2021 13:50    Procedures Procedures   Medications Ordered in ED Medications - No data to  display  ED Course  I have reviewed the triage vital signs and the nursing notes.  Pertinent labs & imaging results that were available during my care of the patient were reviewed by me and considered in my medical decision making (see chart for details).    MDM Rules/Calculators/A&P                           [name] resents with a period of amnesia that has now resolved.  She is neurologically normal and otherwise has had a normal ED work-up.  I talked to her about options for further diagnosis/treatment.  She could consider neurology follow-up for cognitive testing.  She could consider following up with urology for her nocturia as this could be causing some sleep disturbances which could then lead to her memory issues.  She could consider symptomatic management and no further invasive testing as this could be related to her grief.  At this point, she will be discharged home with recommendations to follow-up as an outpatient.  Return precautions were discussed. Final Clinical Impression(s) / ED Diagnoses Final diagnoses:  Transient amnesia    Rx / DC Orders ED Discharge Orders     None        Arnaldo Natal, MD 03/05/21 1413

## 2021-03-13 DIAGNOSIS — F325 Major depressive disorder, single episode, in full remission: Secondary | ICD-10-CM | POA: Diagnosis not present

## 2021-03-13 DIAGNOSIS — I10 Essential (primary) hypertension: Secondary | ICD-10-CM | POA: Diagnosis not present

## 2021-03-13 DIAGNOSIS — I480 Paroxysmal atrial fibrillation: Secondary | ICD-10-CM | POA: Diagnosis not present

## 2021-03-13 DIAGNOSIS — E7800 Pure hypercholesterolemia, unspecified: Secondary | ICD-10-CM | POA: Diagnosis not present

## 2021-03-13 DIAGNOSIS — M810 Age-related osteoporosis without current pathological fracture: Secondary | ICD-10-CM | POA: Diagnosis not present

## 2021-03-14 NOTE — Progress Notes (Signed)
Remote pacemaker transmission.   

## 2021-03-22 DIAGNOSIS — I10 Essential (primary) hypertension: Secondary | ICD-10-CM | POA: Diagnosis not present

## 2021-03-22 DIAGNOSIS — R413 Other amnesia: Secondary | ICD-10-CM | POA: Diagnosis not present

## 2021-03-22 DIAGNOSIS — R269 Unspecified abnormalities of gait and mobility: Secondary | ICD-10-CM | POA: Diagnosis not present

## 2021-03-22 DIAGNOSIS — I480 Paroxysmal atrial fibrillation: Secondary | ICD-10-CM | POA: Diagnosis not present

## 2021-05-08 ENCOUNTER — Telehealth: Payer: Self-pay

## 2021-05-08 NOTE — Telephone Encounter (Signed)
F/u Manual transmission received.  Pt in NSR now.    Episode lasted about 2.5 hours.  Per 12/19/20 OV, no changes necessary due to episodes less than 6-8 hours.    Pt reports she is not currently taking Metoprolol, she never started taking it at instruction of primary care, who increased amlodpine in place of starting Metoprolol.  Pt indicates she will discuss with PA at 06/18/21 visit.    Pt reports at 1:30am this morning her HR was elevated due to her house alarm going off which terrified her.

## 2021-05-08 NOTE — Telephone Encounter (Signed)
Care alert for AT/AF burden Event starting 9/27@ 05:17 is in progress, CVR Hx of PAF, no OAC per 5/11 office note due to durations < 6-8 hrs Burden 0.4%, Diltiazem, Metoprolol, and Rythmol prescribed There are 4 SVT events from 7/17, longest duration 68min 29sec, rates 159-162  Spoke with patient she reports she has been under increased stress lately with the planning of a memorial service for her husband who died a year ago.  The service is this weekend and pt indicates if she can just get through this her stress level will decease.  Attempted to determine if pt symptomatic, she reports that she is always fatigued due to her activity level.  Pt denies need to take PRN diltiazem as her HR is consistently in the 70s.   Advised pt it is important to determine if AF resolved and how long was episode.  Pt will send a manual transmission within the next hour.    Advised I would check for transmission around 11am and call her back.

## 2021-05-20 DIAGNOSIS — Z23 Encounter for immunization: Secondary | ICD-10-CM | POA: Diagnosis not present

## 2021-05-20 DIAGNOSIS — E7800 Pure hypercholesterolemia, unspecified: Secondary | ICD-10-CM | POA: Diagnosis not present

## 2021-05-20 DIAGNOSIS — F411 Generalized anxiety disorder: Secondary | ICD-10-CM | POA: Diagnosis not present

## 2021-05-20 DIAGNOSIS — I10 Essential (primary) hypertension: Secondary | ICD-10-CM | POA: Diagnosis not present

## 2021-05-20 DIAGNOSIS — Z79899 Other long term (current) drug therapy: Secondary | ICD-10-CM | POA: Diagnosis not present

## 2021-05-20 DIAGNOSIS — Z950 Presence of cardiac pacemaker: Secondary | ICD-10-CM | POA: Diagnosis not present

## 2021-05-20 DIAGNOSIS — I480 Paroxysmal atrial fibrillation: Secondary | ICD-10-CM | POA: Diagnosis not present

## 2021-05-20 DIAGNOSIS — R269 Unspecified abnormalities of gait and mobility: Secondary | ICD-10-CM | POA: Diagnosis not present

## 2021-05-20 DIAGNOSIS — Z0000 Encounter for general adult medical examination without abnormal findings: Secondary | ICD-10-CM | POA: Diagnosis not present

## 2021-05-20 DIAGNOSIS — F325 Major depressive disorder, single episode, in full remission: Secondary | ICD-10-CM | POA: Diagnosis not present

## 2021-05-20 DIAGNOSIS — G5139 Clonic hemifacial spasm, unspecified: Secondary | ICD-10-CM | POA: Diagnosis not present

## 2021-05-21 ENCOUNTER — Ambulatory Visit (INDEPENDENT_AMBULATORY_CARE_PROVIDER_SITE_OTHER): Payer: Medicare Other

## 2021-05-21 DIAGNOSIS — I495 Sick sinus syndrome: Secondary | ICD-10-CM

## 2021-05-21 LAB — CUP PACEART REMOTE DEVICE CHECK
Battery Remaining Longevity: 118 mo
Battery Voltage: 3 V
Brady Statistic AP VP Percent: 0.14 %
Brady Statistic AP VS Percent: 61.01 %
Brady Statistic AS VP Percent: 0 %
Brady Statistic AS VS Percent: 38.84 %
Brady Statistic RA Percent Paced: 61.92 %
Brady Statistic RV Percent Paced: 0.14 %
Date Time Interrogation Session: 20221010210713
Implantable Lead Implant Date: 20180709
Implantable Lead Implant Date: 20180709
Implantable Lead Location: 753859
Implantable Lead Location: 753860
Implantable Lead Model: 3830
Implantable Lead Model: 5076
Implantable Pulse Generator Implant Date: 20180709
Lead Channel Impedance Value: 285 Ohm
Lead Channel Impedance Value: 304 Ohm
Lead Channel Impedance Value: 361 Ohm
Lead Channel Impedance Value: 437 Ohm
Lead Channel Pacing Threshold Amplitude: 0.5 V
Lead Channel Pacing Threshold Pulse Width: 0.4 ms
Lead Channel Sensing Intrinsic Amplitude: 2.25 mV
Lead Channel Sensing Intrinsic Amplitude: 2.25 mV
Lead Channel Sensing Intrinsic Amplitude: 8.875 mV
Lead Channel Sensing Intrinsic Amplitude: 8.875 mV
Lead Channel Setting Pacing Amplitude: 1.5 V
Lead Channel Setting Pacing Amplitude: 2.5 V
Lead Channel Setting Pacing Pulse Width: 1 ms
Lead Channel Setting Sensing Sensitivity: 2 mV

## 2021-05-30 NOTE — Progress Notes (Signed)
Remote pacemaker transmission.   

## 2021-06-05 DIAGNOSIS — E7800 Pure hypercholesterolemia, unspecified: Secondary | ICD-10-CM | POA: Diagnosis not present

## 2021-06-05 DIAGNOSIS — F325 Major depressive disorder, single episode, in full remission: Secondary | ICD-10-CM | POA: Diagnosis not present

## 2021-06-05 DIAGNOSIS — M810 Age-related osteoporosis without current pathological fracture: Secondary | ICD-10-CM | POA: Diagnosis not present

## 2021-06-05 DIAGNOSIS — I480 Paroxysmal atrial fibrillation: Secondary | ICD-10-CM | POA: Diagnosis not present

## 2021-06-05 DIAGNOSIS — I10 Essential (primary) hypertension: Secondary | ICD-10-CM | POA: Diagnosis not present

## 2021-06-17 ENCOUNTER — Ambulatory Visit (HOSPITAL_BASED_OUTPATIENT_CLINIC_OR_DEPARTMENT_OTHER): Payer: Medicare Other | Attending: Geriatric Medicine | Admitting: Physical Therapy

## 2021-06-17 ENCOUNTER — Other Ambulatory Visit: Payer: Self-pay

## 2021-06-17 DIAGNOSIS — R2689 Other abnormalities of gait and mobility: Secondary | ICD-10-CM | POA: Diagnosis not present

## 2021-06-17 NOTE — Progress Notes (Signed)
Electrophysiology Office Note Date: 06/18/2021  ID:  ASENETH HACK, DOB 25-Feb-1941, MRN [id number]  PCP: Lajean Manes, MD Primary Cardiologist: None Electrophysiologist: Virl Axe, MD   CC: Pacemaker follow-up  PAMALA HAYMAN is a 80 y.o. female seen today for Virl Axe, MD for routine electrophysiology followup.  Since last being seen in our clinic the patient reports doing well. She wanted to hold off on starting Toprol, and never ended up starting it. She would overall prefer short acting drugs. Her PCP instead increased her amlodipine. She at times feels like her HR jumps up to the 90s too quickly with activity. Histograms look OK. she denies chest pain, palpitations, dyspnea, PND, orthopnea, nausea, vomiting, dizziness, syncope, edema, weight gain, or early satiety.  Device History: MDT dual chamber PPM, implanted 01/17/2017  Past Medical History:  Diagnosis Date   Anxiety    Atrial fibrillation (HCC)    Atrial tachycardia (HCC)    Depression    Facial spasm    Hypertension    Tachy-brady syndrome ([place])    a. s/p MDT His Bundle pacemaker implant 2018 - Dr Caryl Comes   Past Surgical History:  Procedure Laterality Date   APPENDECTOMY     age 60   CARDIAC CATHETERIZATION N/A 12/15/2014   Procedure: Left Heart Cath and Coronary Angiography;  Surgeon: Burnell Blanks, MD;  Location: [place];  Service: Cardiovascular;  Laterality: N/A;   CATARACT EXTRACTION     Right Eye   DILATION AND CURETTAGE OF UTERUS  1990's   benign polyp   PACEMAKER IMPLANT N/A 02/16/2017   Procedure: Pacemaker Implant;  Surgeon: Deboraha Sprang, MD;  Location: [facility];  Service: Cardiovascular;  Laterality: N/A;   TONSILLECTOMY AND ADENOIDECTOMY     --age 29    Current Outpatient Medications  Medication Sig Dispense Refill   amLODipine (NORVASC) 5 MG tablet Take 1 tablet (5 mg total) by mouth daily. 90 tablet 3   Ascorbic Acid (VITAMIN C) 1000 MG tablet Take 500 mg by  mouth daily before supper.      aspirin EC 81 MG tablet Take 81 mg by mouth daily.     cholecalciferol (VITAMIN D) 1000 UNITS tablet Take 1,000 Units by mouth at bedtime.      Coenzyme Q10 200 MG capsule Take 200 mg by mouth daily.     COVID-19 mRNA Vac-TriS, Pfizer, (PFIZER-BIONT COVID-19 VAC-TRIS) SUSP injection Inject into the muscle. 0.3 mL 0   Cranberry 500 MG TABS Take 500 mg by mouth daily.     diltiazem (CARDIZEM) 30 MG tablet TAKE 1 TABLET DAILY AS NEEDED. FOR HEART RATE>100. 90 tablet 3   Diphenhyd-Calamine-Benzyl Alc (IVAREST POISON IVY ITCH EX) Apply 1 application topically daily as needed (itching).     LORazepam (ATIVAN) 0.5 MG tablet Take 0.25 mg by mouth 2 (two) times daily.     Magnesium 400 MG CAPS Take 400 mg by mouth daily.     nitroGLYCERIN (NITROSTAT) 0.4 MG SL tablet Place 1 tablet (0.4 mg total) under the tongue every 5 (five) minutes x 3 doses as needed for chest pain. 25 tablet 3   Polyethyl Glycol-Propyl Glycol (SYSTANE OP) Place 1 drop into both eyes 2 (two) times daily.     Probiotic Product (PROBIOTIC COMPLEX ACIDOPHILUS) CAPS Take 1 tablet by mouth daily. "Enzyme Probiotic Complex"     propafenone (RYTHMOL) 150 MG tablet TAKE 1 TABLET BY MOUTH EVERY 12 HOURS. 180 tablet 3   Turmeric  500 MG CAPS Take 500 mg by mouth at bedtime.     vitamin B-12 (CYANOCOBALAMIN) 1000 MCG tablet Take 1,000 mcg by mouth every other day.     metoprolol succinate (TOPROL XL) 25 MG 24 hr tablet Take 1 tablet (25 mg total) by mouth daily. (Patient not taking: Reported on 06/18/2021) 90 tablet 3   No current facility-administered medications for this visit.    Allergies:   Augmentin [amoxicillin-pot clavulanate], Ciprofloxacin, Diltiazem, Epinephrine, Novocain [procaine], Avapro [irbesartan], Shrimp [shellfish allergy], Iohexol, Penicillins, and Zithromax [azithromycin]   Social History: Social History   Socioeconomic History   Marital status: Married    Spouse name: Not on file    Number of children: Not on file   Years of education: Not on file   Highest education level: Not on file  Occupational History   Not on file  Tobacco Use   Smoking status: Never   Smokeless tobacco: Never  Vaping Use   Vaping Use: Never used  Substance and Sexual Activity   Alcohol use: Yes    Alcohol/week: 14.0 standard drinks    Types: 14 Glasses of wine per week   Drug use: No   Sexual activity: Never    Partners: Male    Birth control/protection: Post-menopausal  Other Topics Concern   Not on file  Social History Narrative   Not on file   Social Determinants of Health   Financial Resource Strain: Not on file  Food Insecurity: Not on file  Transportation Needs: Not on file  Physical Activity: Not on file  Stress: Not on file  Social Connections: Not on file  Intimate Partner Violence: Not on file    Family History: Family History  Problem Relation Age of Onset   Stomach cancer Father 43   Hypertension Mother    Renal Disease Mother 28   Kidney failure Mother    Hypertension Maternal Grandmother    Heart attack Maternal Grandmother      Review of Systems: All other systems reviewed and are otherwise negative except as noted above.  Physical Exam: Vitals:   06/18/21 1122  BP: 124/70  Pulse: 68  SpO2: 98%  Weight: 138 lb 3.2 oz (62.7 kg)  Height: 5\' 6"  (1.676 m)     GEN- The patient is well appearing, alert and oriented x 3 today.   HEENT: normocephalic, atraumatic; sclera clear, conjunctiva pink; hearing intact; oropharynx clear; neck supple  Lungs- Clear to ausculation bilaterally, normal work of breathing.  No wheezes, rales, rhonchi Heart- Regular rate and rhythm, no murmurs, rubs or gallops  GI- soft, non-tender, non-distended, bowel sounds present  Extremities- no clubbing or cyanosis. No edema MS- no significant deformity or atrophy Skin- warm and dry, no rash or lesion; PPM pocket well healed Psych- euthymic mood, full affect Neuro- strength  and sensation are intact  PPM Interrogation- reviewed in detail today,  See PACEART report  EKG:  EKG is ordered today. Personal review of ekg ordered today shows NSR at 68 bpm with normal intervals   Recent Labs: 03/05/2021: ALT 14; BUN 12; Creatinine, Ser 0.77; Hemoglobin 15.2; Platelets 237; Potassium 4.1; Sodium 138   Wt Readings from Last 3 Encounters:  06/18/21 138 lb 3.2 oz (62.7 kg)  12/19/20 143 lb 12.8 oz (65.2 kg)  10/17/20 147 lb 9.6 oz (67 kg)    Other studies Reviewed: Additional studies/ records that were reviewed today include: Previous EP office notes, Previous remote checks, Most recent labwork.   Assessment and Plan:  1. Tachy-Brady syndrome s/p [employer] PPM  Normal PPM function See Pace Art report No changes today   2. [place] EKG today shows NSR 68 bpm with stable intervals on propafenone   3. Paroxysmal Afib CHA2DS2Vac is 4 For now, following [facility] closely by patient preference.  Add lopressor 12.5 mg BID instead of Toprol (pt preference)   4. HTN Stable on current regimen  Decrease amlodipine to 2.5 mg daily   Current medicines are reviewed at length with the patient today.    Labs/ tests ordered today include:  No orders of the defined types were placed in this encounter.  Disposition:   Follow up with Dr. Caryl Comes in 6 months    Signed, Shirley Friar, PA-C  06/18/2021 11:35 AM  [place] [address] [place] [address] [phone] (office) [phone] (fax)

## 2021-06-17 NOTE — Therapy (Signed)
OUTPATIENT PHYSICAL THERAPY LOWER EXTREMITY EVALUATION   Patient Name: Katherine Cardenas MRN: [id number] DOB:[birth year], 80 y.o., female Today's Date: 06/18/2021   PT End of Session - 06/17/21 1202     Visit Number 2    Number of Visits 12    Date for PT Re-Evaluation 07/29/21    PT Start Time 6269    PT Stop Time 1228    PT Time Calculation (min) 43 min    Activity Tolerance Patient tolerated treatment well    Behavior During Therapy [facility] for tasks assessed/performed             Past Medical History:  Diagnosis Date   Anxiety    Atrial fibrillation (HCC)    Atrial tachycardia ([place])    Depression    Facial spasm    Hypertension    Tachy-brady syndrome ([place])    a. s/p MDT His Bundle pacemaker implant 2018 - Dr Caryl Comes   Past Surgical History:  Procedure Laterality Date   APPENDECTOMY     age 31   CARDIAC CATHETERIZATION N/A 12/15/2014   Procedure: Left Heart Cath and Coronary Angiography;  Surgeon: Burnell Blanks, MD;  Location: [place] [facility];  Service: Cardiovascular;  Laterality: N/A;   CATARACT EXTRACTION     Right Eye   DILATION AND CURETTAGE OF UTERUS  1990's   benign polyp   PACEMAKER IMPLANT N/A 02/16/2017   Procedure: Pacemaker Implant;  Surgeon: Deboraha Sprang, MD;  Location: [place] [facility];  Service: Cardiovascular;  Laterality: N/A;   TONSILLECTOMY AND ADENOIDECTOMY     --age 13   Patient Active Problem List   Diagnosis Date Noted   Pacemaker 12/06/2020   Syncope 12/07/2016   Sinus node dysfunction (HCC) 12/07/2016   Paroxysmal atrial fibrillation ([place]) 12/07/2016   Hyperlipidemia 01/18/2016   Atrial tachycardia ([place]) 02/13/2015   Essential hypertension 02/13/2015   Chronic coronary artery disease 12/15/2014    PCP: Lajean Manes, MD  REFERRING PROVIDER: Lajean Manes, MD  REFERRING DIAG: R26.9 (ICD-10-CM) - Unspecified abnormalities of gait and mobility THERAPY DIAG:  R26.9 (ICD-10-CM) - Unspecified abnormalities of gait and  mobility  ONSET DATE: decreasing mobility over time  SUBJECTIVE:   SUBJECTIVE STATEMENT: Patient reports over the past 2 years she has had a significant decrease in her overall mobility. She had a fall last year walking her dog. She hit her head. Overall she has had a lot of life events that have kept her from leaving the house much. She feels this effects her general mobility.  PERTINENT HISTORY: Anxiety, Tachycardia, depression, facial spasm,    PAIN:  Has a history of neck and upper back pain; fingers can become numbness and tingling  PRECAUTIONS: pacemaker    WEIGHT BEARING RESTRICTIONS No  FALLS:  Has patient fallen in last 6 months? No, Number of falls:  She has had several near falls   LIVING ENVIRONMENT: A few steps in from the garage. 2 story house but doesn't need to go up often.   OCCUPATION:   PLOF: Independent with basic ADLs  PATIENT GOALS   To improve balance and ability to walk her dog   OBJECTIVE:   DIAGNOSTIC FINDINGS: Nothing pertinent    COGNITION:  Overall cognitive status: Within functional limits for tasks assessed     SENSATION:  Light touch: Appears intact  Stereognosis: Appears intact  Hot/Cold: Appears intact  Proprioception: Appears intact  POSTURE:  Forward head; flexed posture in standing  LE AROM/PROM:  LE hip  mobility improved  LE MMT:  MMT Right 06/18/2021 Left 06/18/2021  Hip flexion 22.5 20  Hip extension    Hip abduction 20.3 21.5  Hip adduction    Hip internal rotation    Hip external rotation    Knee flexion    Knee extension 19.7  26  Ankle dorsiflexion    Ankle plantarflexion    Ankle inversion    Ankle eversion     (Blank rows = not tested)  LOWER EXTREMITY SPECIAL TESTS:   JOINT MOBILITY ASSESSMENT:    FUNCTIONAL TESTS:  5 times sit to stand: 18 used hands  Berg Balance Scale:     GAIT: Flexed trunk; decreased hip flexion, mild shuffling   TODAY'S TREATMENT: Exercises Shoulder  extension with resistance - Neutral - 1 x daily - 7 x weekly - 3 sets - 10 reps Scapular Retraction with Resistance - 1 x daily - 7 x weekly - 3 sets - 10 reps Shoulder External Rotation and Scapular Retraction with Resistance - 1 x daily - 7 x weekly - 3 sets - 10 reps    PATIENT EDUCATION:  Education details: reviewed benefits of balance training; reviewed  Person educated: Patient Education method: Explanation, Demonstration, Tactile cues, Verbal cues, and Handouts Education comprehension: verbalized understanding, returned demonstration, verbal cues required, tactile cues required, and needs further education   HOME EXERCISE PROGRAM: Access Code: [email or web address] URL: [email or web address] Date: 06/18/2021 Prepared by: Carolyne Littles  Exercises Shoulder extension with resistance - Neutral - 1 x daily - 7 x weekly - 3 sets - 10 reps Scapular Retraction with Resistance - 1 x daily - 7 x weekly - 3 sets - 10 reps Shoulder External Rotation and Scapular Retraction with Resistance - 1 x daily - 7 x weekly - 3 sets - 10 reps   ASSESSMENT:  CLINICAL IMPRESSION: Patient is a 80 year old female with progressively decreasing overall balance and mobility. She had a serious fall last year. She used to walk her dog, but she no longer odes because of fear of falling. She presents with limited strength and limited balance as measured by the BERG balance scale. She would benefit from skilled therapy to improve ability to perform ADL's and return to walking for exercise.   Abnormal gait, decreased activity tolerance, decreased cognition, decreased endurance, difficulty walking, decreased strength, and pain  cleaning, community activity, driving, meal prep, laundry, and yard work  Other factors: Anxiety, Tachycardia, depression,   REHAB POTENTIAL: Good  CLINICAL DECISION MAKING: Evolving/moderate complexity decreasing mobility over time   EVALUATION COMPLEXITY:  Moderate   GOALS: Goals reviewed with patient? Yes  SHORT TERM GOALS:  STG Name Target Date Goal status  1 Patient will increase her BERG score by 6 points  Baseline:  07/09/2021 INITIAL  2 Patient will increase general hip strength by 5lbs bilateral  Baseline:  07/09/2021 INITIAL  3 Patient will be independent with basic HEP for balance and strengthening  Baseline: 07/09/2021 INITIAL  LONG TERM GOALS:   LTG Name Target Date Goal status  1 Patient will return to walking her dog without fear of falling  Baseline: 07/30/2021 INITIAL  2 Patient will ambulate with improved hip flexion in order to improve her ability to ambulate without tripping.  Baseline: 07/30/2021 INITIAL  3 Patient will be independent with complete HEP for balance and strengthening  Baseline: 07/30/2021 INITIAL   PLAN: PT FREQUENCY: 2x/week  PT DURATION: 6 weeks  PLANNED INTERVENTIONS: Therapeutic exercises, Therapeutic activity, Neuro Muscular re-education, Balance training,  Gait training, Patient/Family education, Joint mobilization, Stair training, DME instructions, Aquatic Therapy, Cryotherapy, Moist heat, Taping, and Manual therapy  PLAN FOR NEXT SESSION: begin balance training consider tandem stance and narrow base; consider hurdles; consider progression to air-ex; consider forward and backwards balance   Carney Living PT DPT  06/18/2021, 1:39 PM

## 2021-06-18 ENCOUNTER — Ambulatory Visit (INDEPENDENT_AMBULATORY_CARE_PROVIDER_SITE_OTHER): Payer: Medicare Other | Admitting: Student

## 2021-06-18 ENCOUNTER — Encounter (HOSPITAL_BASED_OUTPATIENT_CLINIC_OR_DEPARTMENT_OTHER): Payer: Self-pay | Admitting: Physical Therapy

## 2021-06-18 ENCOUNTER — Encounter: Payer: Self-pay | Admitting: Student

## 2021-06-18 VITALS — BP 124/70 | HR 68 | Ht 66.0 in | Wt 138.2 lb

## 2021-06-18 DIAGNOSIS — I10 Essential (primary) hypertension: Secondary | ICD-10-CM | POA: Diagnosis not present

## 2021-06-18 DIAGNOSIS — I471 Supraventricular tachycardia: Secondary | ICD-10-CM

## 2021-06-18 DIAGNOSIS — I495 Sick sinus syndrome: Secondary | ICD-10-CM | POA: Diagnosis not present

## 2021-06-18 DIAGNOSIS — I480 Paroxysmal atrial fibrillation: Secondary | ICD-10-CM

## 2021-06-18 DIAGNOSIS — I4719 Other supraventricular tachycardia: Secondary | ICD-10-CM

## 2021-06-18 LAB — CUP PACEART INCLINIC DEVICE CHECK
Battery Remaining Longevity: 118 mo
Battery Voltage: 3 V
Brady Statistic AP VP Percent: 0.23 %
Brady Statistic AP VS Percent: 61.08 %
Brady Statistic AS VP Percent: 0.01 %
Brady Statistic AS VS Percent: 38.68 %
Brady Statistic RA Percent Paced: 63.08 %
Brady Statistic RV Percent Paced: 0.27 %
Date Time Interrogation Session: 20221108114938
Implantable Lead Implant Date: 20180709
Implantable Lead Implant Date: 20180709
Implantable Lead Location: 753859
Implantable Lead Location: 753860
Implantable Lead Model: 3830
Implantable Lead Model: 5076
Implantable Pulse Generator Implant Date: 20180709
Lead Channel Impedance Value: 285 Ohm
Lead Channel Impedance Value: 323 Ohm
Lead Channel Impedance Value: 399 Ohm
Lead Channel Impedance Value: 456 Ohm
Lead Channel Pacing Threshold Amplitude: 0.5 V
Lead Channel Pacing Threshold Pulse Width: 0.4 ms
Lead Channel Sensing Intrinsic Amplitude: 1.25 mV
Lead Channel Sensing Intrinsic Amplitude: 10.875 mV
Lead Channel Sensing Intrinsic Amplitude: 2.25 mV
Lead Channel Sensing Intrinsic Amplitude: 8.5 mV
Lead Channel Setting Pacing Amplitude: 1.5 V
Lead Channel Setting Pacing Amplitude: 2.5 V
Lead Channel Setting Pacing Pulse Width: 1 ms
Lead Channel Setting Sensing Sensitivity: 2 mV

## 2021-06-18 MED ORDER — METOPROLOL TARTRATE 25 MG PO TABS: 12.5000 mg | ORAL_TABLET | Freq: Two times a day (BID) | ORAL | 3 refills | Status: DC

## 2021-06-18 MED ORDER — AMLODIPINE BESYLATE 2.5 MG PO TABS: 2.5000 mg | ORAL_TABLET | Freq: Every day | ORAL | 3 refills | Status: DC

## 2021-06-18 NOTE — Patient Instructions (Signed)
Medication Instructions:  Your physician has recommended you make the following change in your medication:   DECREASE: Amlodipine to 2.5mg  daily START: Metoprolol Tartrate 25mg  1/2 tablet (12.5mg ) twice daily  *If you need a refill on your cardiac medications before your next appointment, please call your pharmacy*   Lab Work: None If you have labs (blood work) drawn today and your tests are completely normal, you will receive your results only by: [place] (if you have MyChart) OR A paper copy in the mail If you have any lab test that is abnormal or we need to change your treatment, we will call you to review the results.   Follow-Up: At [facility], you and your health needs are our priority.  As part of our continuing mission to provide you with exceptional heart care, we have created designated Provider Care Teams.  These Care Teams include your primary Cardiologist (physician) and Advanced Practice Providers (APPs -  Physician Assistants and Nurse Practitioners) who all work together to provide you with the care you need, when you need it.   Your next appointment:   6 month(s)  The format for your next appointment:   In Person  Provider:   You may see Virl Axe, MD or one of the following Advanced Practice Providers on your designated Care Team:   Tommye Standard, [place] "[facility]" [place], [place]

## 2021-07-02 DIAGNOSIS — R300 Dysuria: Secondary | ICD-10-CM | POA: Diagnosis not present

## 2021-07-22 ENCOUNTER — Other Ambulatory Visit: Payer: Self-pay

## 2021-07-22 ENCOUNTER — Other Ambulatory Visit (HOSPITAL_BASED_OUTPATIENT_CLINIC_OR_DEPARTMENT_OTHER): Payer: Self-pay

## 2021-07-22 ENCOUNTER — Ambulatory Visit: Payer: Medicare Other | Attending: Internal Medicine

## 2021-07-22 DIAGNOSIS — Z23 Encounter for immunization: Secondary | ICD-10-CM

## 2021-07-22 MED ORDER — MODERNA COVID-19 BIVAL BOOSTER 50 MCG/0.5ML IM SUSP
INTRAMUSCULAR | 0 refills | Status: DC
  Filled 2021-07-22: qty 0.5, 1d supply, fill #0

## 2021-07-22 NOTE — Progress Notes (Signed)
   Covid-19 Vaccination Clinic  Name:  NIKIRA KUSHNIR    MRN: [id number] DOB: [birth year]  07/22/2021  Ms. Hornsby was observed post Covid-19 immunization for 15 minutes without incident. She was provided with Vaccine Information Sheet and instruction to access the V-Safe system.   Ms. Char was instructed to call 911 with any severe reactions post vaccine: Difficulty breathing  Swelling of face and throat  A fast heartbeat  A bad rash all over body  Dizziness and weakness   Immunizations Administered     Name Date Dose VIS Date Route   Pfizer Covid-19 Vaccine Bivalent Booster 07/22/2021 11:13 AM 0.3 mL 04/10/2021 Intramuscular   Manufacturer: [place]   Lot: [id number]   [place]: [phone]

## 2021-08-20 ENCOUNTER — Ambulatory Visit (INDEPENDENT_AMBULATORY_CARE_PROVIDER_SITE_OTHER): Payer: Medicare Other

## 2021-08-20 DIAGNOSIS — I495 Sick sinus syndrome: Secondary | ICD-10-CM

## 2021-08-20 LAB — CUP PACEART REMOTE DEVICE CHECK
Battery Remaining Longevity: 116 mo
Battery Voltage: 3 V
Brady Statistic AP VP Percent: 0.19 %
Brady Statistic AP VS Percent: 53.75 %
Brady Statistic AS VP Percent: 0.01 %
Brady Statistic AS VS Percent: 46.05 %
Brady Statistic RA Percent Paced: 55.17 %
Brady Statistic RV Percent Paced: 0.2 %
Date Time Interrogation Session: 20230109221649
Implantable Lead Implant Date: 20180709
Implantable Lead Implant Date: 20180709
Implantable Lead Location: 753859
Implantable Lead Location: 753860
Implantable Lead Model: 3830
Implantable Lead Model: 5076
Implantable Pulse Generator Implant Date: 20180709
Lead Channel Impedance Value: 266 Ohm
Lead Channel Impedance Value: 304 Ohm
Lead Channel Impedance Value: 361 Ohm
Lead Channel Impedance Value: 418 Ohm
Lead Channel Pacing Threshold Amplitude: 0.5 V
Lead Channel Pacing Threshold Pulse Width: 0.4 ms
Lead Channel Sensing Intrinsic Amplitude: 2 mV
Lead Channel Sensing Intrinsic Amplitude: 2 mV
Lead Channel Sensing Intrinsic Amplitude: 6.875 mV
Lead Channel Sensing Intrinsic Amplitude: 6.875 mV
Lead Channel Setting Pacing Amplitude: 1.5 V
Lead Channel Setting Pacing Amplitude: 2.5 V
Lead Channel Setting Pacing Pulse Width: 1 ms
Lead Channel Setting Sensing Sensitivity: 2 mV

## 2021-08-29 NOTE — Progress Notes (Signed)
Remote pacemaker transmission.   

## 2021-09-02 DIAGNOSIS — Z950 Presence of cardiac pacemaker: Secondary | ICD-10-CM | POA: Diagnosis not present

## 2021-09-02 DIAGNOSIS — I10 Essential (primary) hypertension: Secondary | ICD-10-CM | POA: Diagnosis not present

## 2021-09-02 DIAGNOSIS — Z79899 Other long term (current) drug therapy: Secondary | ICD-10-CM | POA: Diagnosis not present

## 2021-09-02 DIAGNOSIS — I480 Paroxysmal atrial fibrillation: Secondary | ICD-10-CM | POA: Diagnosis not present

## 2021-09-02 DIAGNOSIS — Z87898 Personal history of other specified conditions: Secondary | ICD-10-CM | POA: Diagnosis not present

## 2021-09-12 ENCOUNTER — Other Ambulatory Visit: Payer: Self-pay

## 2021-09-12 ENCOUNTER — Ambulatory Visit (HOSPITAL_BASED_OUTPATIENT_CLINIC_OR_DEPARTMENT_OTHER): Payer: Medicare Other | Attending: Geriatric Medicine | Admitting: Physical Therapy

## 2021-09-12 ENCOUNTER — Encounter (HOSPITAL_BASED_OUTPATIENT_CLINIC_OR_DEPARTMENT_OTHER): Payer: Self-pay | Admitting: Physical Therapy

## 2021-09-12 DIAGNOSIS — R2689 Other abnormalities of gait and mobility: Secondary | ICD-10-CM | POA: Diagnosis not present

## 2021-09-12 NOTE — Therapy (Signed)
OUTPATIENT PHYSICAL THERAPY LOWER EXTREMITY EVALUATION   Patient Name: Katherine Cardenas MRN: [id number] DOB:[birth year], 81 y.o., female Today's Date: 09/12/2021     Past Medical History:  Diagnosis Date   Anxiety    Atrial fibrillation [facility])    Atrial tachycardia ([place])    Depression    Facial spasm    Hypertension    Tachy-brady syndrome [facility])    a. s/p MDT His Bundle pacemaker implant 2018 - Dr Caryl Comes   Past Surgical History:  Procedure Laterality Date   APPENDECTOMY     age 70   CARDIAC CATHETERIZATION N/A 12/15/2014   Procedure: Left Heart Cath and Coronary Angiography;  Surgeon: Burnell Blanks, MD;  Location: [place] [facility];  Service: Cardiovascular;  Laterality: N/A;   CATARACT EXTRACTION     Right Eye   DILATION AND CURETTAGE OF UTERUS  1990's   benign polyp   PACEMAKER IMPLANT N/A 02/16/2017   Procedure: Pacemaker Implant;  Surgeon: Deboraha Sprang, MD;  Location: [place] [facility];  Service: Cardiovascular;  Laterality: N/A;   TONSILLECTOMY AND ADENOIDECTOMY     --age 55   Patient Active Problem List   Diagnosis Date Noted   Pacemaker 12/06/2020   Syncope 12/07/2016   Sinus node dysfunction (HCC) 12/07/2016   Paroxysmal atrial fibrillation ([place]) 12/07/2016   Hyperlipidemia 01/18/2016   Atrial tachycardia ([place]) 02/13/2015   Essential hypertension 02/13/2015   Chronic coronary artery disease 12/15/2014    PCP: Lajean Manes, MD  REFERRING PROVIDER: Lajean Manes, MD  REFERRING DIAG: R26.9 (ICD-10-CM) - Unspecified abnormalities of gait and mobility THERAPY DIAG:  R26.9 (ICD-10-CM) - Unspecified abnormalities of gait and mobility  ONSET DATE: decreasing mobility over time  SUBJECTIVE:   SUBJECTIVE STATEMENT: Patient reports over the past 2 years she has had a significant decrease in her overall mobility. She had a fall last year walking her dog. She hit her head. Overall she has had a lot of life events that have kept her from leaving the house  much. She feels this effects her general mobility. She came in for an intial eval on 11/7. We had some scheduling issues and with the holidays she was unable to come in. She comes in today having completed some of the exercises that she was given on the evaluation.   PERTINENT HISTORY: Anxiety, Tachycardia, depression, facial spasm,    PAIN:  Has a history of neck and upper back pain; fingers can become numbness and tingling  PRECAUTIONS: pacemaker    WEIGHT BEARING RESTRICTIONS No  FALLS:  Has patient fallen in last 6 months? No, Number of falls:  She has had several near falls   LIVING ENVIRONMENT: A few steps in from the garage. 2 story house but doesn't need to go up often.   OCCUPATION:   Recreation: the patient would like to be able to walk her dog.   PLOF: Independent with basic ADLs  PATIENT GOALS   To improve balance and ability to walk her dog   OBJECTIVE:   DIAGNOSTIC FINDINGS: Nothing pertinent    COGNITION:  Overall cognitive status: Within functional limits for tasks assessed     SENSATION:  Light touch: Appears intact  Stereognosis: Appears intact  Hot/Cold: Appears intact  Proprioception: Appears intact Bilateral foot neuropathy   POSTURE:  Forward head; flexed posture in standing  LE AROM/PROM:  LE hip mobility improved  LE MMT:  MMT Right 09/12/2021 Left 09/12/2021  Hip flexion Initial 22.5  2/3 16.4 20  2/3 18.4  Hip extension    Hip abduction Initial 20.3  2/3 25.4 Initial 21.5   2/3 25.6  Hip adduction    Hip internal rotation    Hip external rotation    Knee flexion    Knee extension Initial 19.7  2/3  30.7  Initial 26   2/3 24.6  Ankle dorsiflexion    Ankle plantarflexion    Ankle inversion    Ankle eversion     (Blank rows = not tested)  LOWER EXTREMITY SPECIAL TESTS:   JOINT MOBILITY ASSESSMENT:    FUNCTIONAL TESTS:  5 times sit to stand: 18 sec used hands 11/7 2/3  17 sec  Berg Balance Scale: To be  performed next visit   Tandem stance SBA bilateral 20 sec Narrow base eyes open SBA 20 sec  Narrow base on air-ex 20 sec   Narrow base on air-ex min a EC mod a  Tandem on air-ex eyes opened 2x20 sec hold      GAIT: Flexed trunk; decreased hip flexion, mild shuffling   TODAY'S TREATMENT: Exercises Laq 3x10 red  Hip abduction 3x10 red  Seated march 3x10 red      PATIENT EDUCATION:  Education details: reviewed benefits of balance training; reviewed  Person educated: Patient Education method: Explanation, Demonstration, Tactile cues, Verbal cues, and Handouts Education comprehension: verbalized understanding, returned demonstration, verbal cues required, tactile cues required, and needs further education   HOME EXERCISE PROGRAM: Access Code: [email or web address] URL: [email or web address] Date: 06/18/2021 Prepared by: Carolyne Littles  Exercises Shoulder extension with resistance - Neutral - 1 x daily - 7 x weekly - 3 sets - 10 reps Scapular Retraction with Resistance - 1 x daily - 7 x weekly - 3 sets - 10 reps Shoulder External Rotation and Scapular Retraction with Resistance - 1 x daily - 7 x weekly - 3 sets - 10 reps   ASSESSMENT:  CLINICAL IMPRESSION: Patient is a 81 year old female with progressively decreasing overall balance and mobility. She had a serious fall last year. She used to walk her dog, but she no longer odes because of fear of falling. She presents with limited strength and limited balance with static balance testing. We will do a formal BERG next visit. Her strength measurements have stayed consistent since her initial eval In October.  She would benefit from skilled therapy to improve ability to perform ADL's and return to walking for exercise.   Abnormal gait, decreased activity tolerance, decreased cognition, decreased endurance, difficulty walking, decreased strength, and pain  cleaning, community activity, driving, meal prep, laundry, and yard  work  Other factors: Anxiety, Tachycardia, depression,   REHAB POTENTIAL: Good  CLINICAL DECISION MAKING: Evolving/moderate complexity decreasing mobility over time   EVALUATION COMPLEXITY: Moderate   GOALS: Goals reviewed with patient? Yes All goals remain consistent   SHORT TERM GOALS:  STG Name Target Date Goal status  1 Patient will increase her BERG score by 6 points  Baseline:  10/03/2021 INITIAL  2 Patient will increase general hip strength by 5lbs bilateral  Baseline:  10/03/2021 INITIAL  3 Patient will be independent with basic HEP for balance and strengthening  Baseline: 10/03/2021 INITIAL  LONG TERM GOALS:   LTG Name Target Date Goal status  1 Patient will return to walking her dog without fear of falling  Baseline: 10/24/2021 INITIAL  2 Patient will ambulate with improved hip flexion in order to improve her ability to ambulate without tripping.  Baseline: 10/24/2021 INITIAL  3 Patient  will be independent with complete HEP for balance and strengthening  Baseline: 10/24/2021 INITIAL   PLAN: PT FREQUENCY: 2x/week  PT DURATION: 6 weeks  PLANNED INTERVENTIONS: Therapeutic exercises, Therapeutic activity, Neuro Muscular re-education, Balance training, Gait training, Patient/Family education, Joint mobilization, Stair training, DME instructions, Aquatic Therapy, Cryotherapy, Moist heat, Taping, and Manual therapy  PLAN FOR NEXT SESSION: begin balance training consider tandem stance and narrow base; consider hurdles; consider progression to air-ex; consider forward and backwards balance   Carney Living PT DPT  09/12/2021, 11:50 AM

## 2021-09-13 ENCOUNTER — Emergency Department (HOSPITAL_BASED_OUTPATIENT_CLINIC_OR_DEPARTMENT_OTHER)
Admission: EM | Admit: 2021-09-13 | Discharge: 2021-09-13 | Disposition: A | Discharge status: Home / Self Care | Payer: Medicare Other | Attending: Emergency Medicine | Admitting: Emergency Medicine

## 2021-09-13 ENCOUNTER — Encounter (HOSPITAL_BASED_OUTPATIENT_CLINIC_OR_DEPARTMENT_OTHER): Payer: Self-pay | Admitting: Physical Therapy

## 2021-09-13 ENCOUNTER — Encounter (HOSPITAL_BASED_OUTPATIENT_CLINIC_OR_DEPARTMENT_OTHER): Payer: Self-pay | Admitting: Obstetrics and Gynecology

## 2021-09-13 DIAGNOSIS — Z79899 Other long term (current) drug therapy: Secondary | ICD-10-CM | POA: Insufficient documentation

## 2021-09-13 DIAGNOSIS — Z7982 Long term (current) use of aspirin: Secondary | ICD-10-CM | POA: Diagnosis not present

## 2021-09-13 DIAGNOSIS — R319 Hematuria, unspecified: Secondary | ICD-10-CM | POA: Diagnosis present

## 2021-09-13 DIAGNOSIS — N3001 Acute cystitis with hematuria: Secondary | ICD-10-CM | POA: Insufficient documentation

## 2021-09-13 LAB — URINALYSIS, ROUTINE W REFLEX MICROSCOPIC
Bilirubin Urine: NEGATIVE
Glucose, UA: NEGATIVE mg/dL
Ketones, ur: NEGATIVE mg/dL
Nitrite: NEGATIVE
Protein, ur: 100 mg/dL — AB
Specific Gravity, Urine: <1.005 — ABNORMAL LOW (ref 1.005–1.030)
WBC, UA: >50 WBC/hpf — ABNORMAL HIGH (ref 0–5)
pH: 6.5 (ref 5.0–8.0)

## 2021-09-13 LAB — COMPREHENSIVE METABOLIC PANEL
ALT: 15 U/L (ref 0–44)
AST: 23 U/L (ref 15–41)
Albumin: 4.3 g/dL (ref 3.5–5.0)
Alkaline Phosphatase: 56 U/L (ref 38–126)
Anion gap: 7 (ref 5–15)
BUN: 15 mg/dL (ref 8–23)
CO2: 27 mmol/L (ref 22–32)
Calcium: 9.6 mg/dL (ref 8.9–10.3)
Chloride: 105 mmol/L (ref 98–111)
Creatinine, Ser: 0.79 mg/dL (ref 0.44–1.00)
GFR, Estimated: >60 mL/min (ref >60)
Glucose, Bld: 96 mg/dL (ref 70–99)
Potassium: 4 mmol/L (ref 3.5–5.1)
Sodium: 139 mmol/L (ref 135–145)
Total Bilirubin: 1 mg/dL (ref 0.3–1.2)
Total Protein: 7.2 g/dL (ref 6.5–8.1)

## 2021-09-13 LAB — CBC
HCT: 44.5 % (ref 36.0–46.0)
Hemoglobin: 14.3 g/dL (ref 12.0–15.0)
MCH: 30.4 pg (ref 26.0–34.0)
MCHC: 32.1 g/dL (ref 30.0–36.0)
MCV: 94.5 fL (ref 80.0–100.0)
Platelets: 242 10*3/uL (ref 150–400)
RBC: 4.71 MIL/uL (ref 3.87–5.11)
RDW: 13.4 % (ref 11.5–15.5)
WBC: 10.4 10*3/uL (ref 4.0–10.5)
nRBC: 0 % (ref 0.0–0.2)

## 2021-09-13 LAB — LIPASE, BLOOD: Lipase: 30 U/L (ref 11–51)

## 2021-09-13 MED ORDER — DOXYCYCLINE HYCLATE 100 MG PO TABS
100.0000 mg | ORAL_TABLET | Freq: Once | ORAL | Status: AC
  Administered 2021-09-13: 100 mg via ORAL
  Filled 2021-09-13: qty 1

## 2021-09-13 MED ORDER — DOXYCYCLINE HYCLATE 100 MG PO CAPS: 100.0000 mg | ORAL_CAPSULE | Freq: Two times a day (BID) | ORAL | 0 refills | Status: DC

## 2021-09-13 MED ORDER — DOXYCYCLINE HYCLATE 100 MG PO CAPS: 100.0000 mg | ORAL_CAPSULE | Freq: Two times a day (BID) | ORAL | 0 refills | Status: AC

## 2021-09-13 NOTE — Discharge Instructions (Signed)
Please follow-up with your primary care doctor's office on Monday for your urine culture results and for recheck of your symptoms.  I expect your urine discomfort, incontinence, and bleeding to have improved by Monday.  If you are still having persistent symptoms, you may need a different type of antibiotic.  We talked about alternative options such as Macrobid.  We decided to try you on a course of doxycycline because of your prior history.  However, if your infection is resistant to doxycycline, you will need to be switched to a different antibiotic.

## 2021-09-13 NOTE — ED Triage Notes (Signed)
Patient reports to the ER for hematuria. Patient reports it started this morning. States she had 1 episode of incontinence yesterday. Patient reports she has urgency and has been wearing depends since yesterday. Reports some pelvic pain as well

## 2021-09-13 NOTE — ED Provider Notes (Signed)
[facility] EMERGENCY DEPT Provider Note   CSN: [id number] Arrival date & time: 09/13/21  1028     History  Chief Complaint  Patient presents with   Hematuria    Katherine Cardenas is a 81 y.o. female presenting to the emergency department with hematuria.  She reports some dysuria and an episode of incontinence yesterday.  Symptoms ongoing for several days.  This morning she noted that there was frank blood in her urine.  She does have a history of UTIs but has been many years.  She unfortunately has an extensive list of allergies to many antibiotics, and states the doxycycline usually clears up her urine infection.  She is here with her daughter at the bedside.  HPI     Home Medications Prior to Admission medications   Medication Sig Start Date End Date Taking? Authorizing Provider  doxycycline (VIBRAMYCIN) 100 MG capsule Take 1 capsule (100 mg total) by mouth 2 (two) times daily. 09/13/21  Yes Cristina Mattern, Carola Rhine, MD  doxycycline (VIBRAMYCIN) 100 MG capsule Take 1 capsule (100 mg total) by mouth 2 (two) times daily for 7 days. 09/13/21 09/20/21 Yes Mekiah Wahler, Carola Rhine, MD  amLODipine (NORVASC) 2.5 MG tablet Take 1 tablet (2.5 mg total) by mouth daily. 06/18/21   Shirley Friar, PA-C  Ascorbic Acid (VITAMIN C) 1000 MG tablet Take 500 mg by mouth daily before supper.     [provider]  aspirin EC 81 MG tablet Take 81 mg by mouth daily.    [provider]  cholecalciferol (VITAMIN D) 1000 UNITS tablet Take 1,000 Units by mouth at bedtime.     [provider]  Coenzyme Q10 200 MG capsule Take 200 mg by mouth daily.    [provider]  COVID-19 mRNA bivalent vaccine, Moderna, (MODERNA COVID-19 BIVAL BOOSTER) 50 MCG/0.5ML injection Inject into the muscle. 07/22/21   Carlyle Basques, MD  COVID-19 mRNA Vac-TriS, Pfizer, (PFIZER-BIONT COVID-19 VAC-TRIS) SUSP injection Inject into the muscle. 02/04/21   Carlyle Basques, MD  Cranberry 500 MG TABS  Take 500 mg by mouth daily.    [provider]  diltiazem (CARDIZEM) 30 MG tablet TAKE 1 TABLET DAILY AS NEEDED. FOR HEART RATE>100. 10/17/20   Deboraha Sprang, MD  Diphenhyd-Calamine-Benzyl Alc (IVAREST POISON IVY [facility] EX) Apply 1 application topically daily as needed (itching).    [provider]  LORazepam (ATIVAN) 0.5 MG tablet Take 0.25 mg by mouth 2 (two) times daily.    [provider]  Magnesium 400 MG CAPS Take 400 mg by mouth daily.    [provider]  metoprolol tartrate (LOPRESSOR) 25 MG tablet Take 0.5 tablets (12.5 mg total) by mouth 2 (two) times daily. 06/18/21 09/16/21  Shirley Friar, PA-C  nitroGLYCERIN (NITROSTAT) 0.4 MG SL tablet Place 1 tablet (0.4 mg total) under the tongue every 5 (five) minutes x 3 doses as needed for chest pain. 05/25/18   Deboraha Sprang, MD  Polyethyl Glycol-Propyl Glycol (SYSTANE OP) Place 1 drop into both eyes 2 (two) times daily.    [provider]  Probiotic Product (PROBIOTIC COMPLEX ACIDOPHILUS) CAPS Take 1 tablet by mouth daily. "Enzyme Probiotic Complex"    [provider]  propafenone (RYTHMOL) 150 MG tablet TAKE 1 TABLET BY MOUTH EVERY 12 HOURS. 12/19/20   Deboraha Sprang, MD  Turmeric 500 MG CAPS Take 500 mg by mouth at bedtime.    [provider]  vitamin B-12 (CYANOCOBALAMIN) 1000 MCG tablet Take 1,000 mcg  by mouth every other day.    [provider]      Allergies    Augmentin [amoxicillin-pot clavulanate], Ciprofloxacin, Diltiazem, Epinephrine, Novocain [procaine], Avapro [irbesartan], Shrimp [shellfish allergy], Iohexol, Penicillins, and Zithromax [azithromycin]    Review of Systems   Review of Systems  Physical Exam Updated Vital Signs BP 132/73 (BP Location: Right Arm)    Pulse 65    Temp 98.5 F (36.9 C)    Resp 16    Ht 5\' 6"  (1.676 m)    Wt 64.4 kg    LMP 08/11/1988 (Approximate)    SpO2 98%    BMI 22.92 kg/m  Physical Exam Constitutional:       General: She is not in acute distress. HENT:     Head: Normocephalic and atraumatic.  Cardiovascular:     Rate and Rhythm: Normal rate and regular rhythm.  Skin:    General: Skin is warm and dry.  Neurological:     Mental Status: She is alert.  Psychiatric:        Mood and Affect: Mood normal.        Behavior: Behavior normal.    ED Results / Procedures / Treatments   Labs (all labs ordered are listed, but only abnormal results are displayed) Labs Reviewed  URINALYSIS, ROUTINE W REFLEX MICROSCOPIC - Abnormal; Notable for the following components:      Result Value   Color, Urine ORANGE (*)    APPearance HAZY (*)    Specific Gravity, Urine <1.005 (*)    Hgb urine dipstick LARGE (*)    Protein, ur 100 (*)    Leukocytes,Ua LARGE (*)    WBC, UA >50 (*)    Bacteria, UA RARE (*)    Non Squamous Epithelial 0-5 (*)    All other components within normal limits  URINE CULTURE  LIPASE, BLOOD  COMPREHENSIVE METABOLIC PANEL  CBC    EKG None  Radiology No results found.  Procedures Procedures    Medications Ordered in ED Medications  doxycycline (VIBRA-TABS) tablet 100 mg (100 mg Oral Given 09/13/21 1305)    ED Course/ Medical Decision Making/ A&P Clinical Course as of 09/13/21 1648  Fri Sep 13, 2021  1230 UA is consistent with UTI.  We will send a urine culture and initiate antibiotics.  Hemoglobin is otherwise stable, doubt significant blood loss.  Patient and daughter in agreement with the plan.  First dose antibiotic given here. [MT]  81 *Long conversation with the patient and her daughter, I have agreed to trial a course of doxycycline to treat for very likely UTI.  I explained to them clearly that doxycycline is not a first-line or indicated medication and UTI, although case reports indicate that there is some evidence for its use as a 2nd line medication.  She is adamant that this is the only medicine is clear to UTIs in the past.  I think is reasonable to start this  while we await urine culture results.  I advise a follow-up with her PCPs office early next week to confirm their culture results, and if she persists having symptoms after 48 hours, to contact the PCPs office to switch to an alternative medication, such as Macrobid. [MT]    Clinical Course User Index [MT] Karell Tukes, Carola Rhine, MD                           Medical Decision Making Amount and/or Complexity of Data Reviewed Labs:  ordered.  Risk Prescription drug management.   Patient is here with hematuria.  Differential would include infection versus interstitial cystitis versus intrarenal mass versus other.  Most likely is related to UTI with her symptoms of dysuria and incontinence.  We will check a UA.  I personally reviewed the patient's labs.  She has no acute anemia, no leukocytosis.  She does not meet SIRS or sepsis criteria and is clinically well-appearing.  Daughter at bedside provided supplemental history We'll send urine culture to lab        Final Clinical Impression(s) / ED Diagnoses Final diagnoses:  Acute cystitis with hematuria    Rx / DC Orders ED Discharge Orders          Ordered    doxycycline (VIBRAMYCIN) 100 MG capsule  2 times daily        09/13/21 1302    doxycycline (VIBRAMYCIN) 100 MG capsule  2 times daily        09/13/21 1303              Wyvonnia Dusky, MD 09/13/21 [id number]

## 2021-09-13 NOTE — ED Notes (Signed)
Lab will add on a urine culture

## 2021-09-15 LAB — URINE CULTURE: Culture: 10000 — AB

## 2021-09-16 ENCOUNTER — Telehealth (HOSPITAL_BASED_OUTPATIENT_CLINIC_OR_DEPARTMENT_OTHER): Payer: Self-pay | Admitting: *Deleted

## 2021-09-16 NOTE — Telephone Encounter (Signed)
Post ED Visit - Positive Culture Follow-up  Culture report reviewed by antimicrobial stewardship pharmacist: [facility] Team []  Elenor Quinones, Pharm.D. []  Heide Guile, Pharm.D., BCPS AQ-ID []  Parks Neptune, Pharm.D., BCPS []  Alycia Rossetti, Pharm.D., BCPS []  [employer], Pharm.D., BCPS, AAHIVP []  Legrand Como, Pharm.D., BCPS, AAHIVP []  Salome Arnt, PharmD, BCPS []  Johnnette Gourd, PharmD, BCPS []  Hughes Better, PharmD, BCPS []  Leeroy Cha, PharmD []  Laqueta Linden, PharmD, BCPS [x]  Myra Rude, PharmD  [place] Team []  Leodis Sias, PharmD []  Lindell Spar, PharmD []  Royetta Asal, PharmD []  Graylin Shiver, Rph []  Rema Fendt) Glennon Mac, PharmD []  Arlyn Dunning, PharmD []  Netta Cedars, PharmD []  Dia Sitter, PharmD []  Leone Haven, PharmD []  Gretta Arab, PharmD []  Theodis Shove, PharmD []  Peggyann Juba, PharmD []  Reuel Boom, PharmD   Positive urine culture Treated with Doxycycline, No further patient follow-up is required at this time.  Rosie Fate 09/16/2021, 8:26 AM

## 2021-10-03 DIAGNOSIS — I10 Essential (primary) hypertension: Secondary | ICD-10-CM | POA: Diagnosis not present

## 2021-10-03 DIAGNOSIS — F325 Major depressive disorder, single episode, in full remission: Secondary | ICD-10-CM | POA: Diagnosis not present

## 2021-10-03 DIAGNOSIS — E7800 Pure hypercholesterolemia, unspecified: Secondary | ICD-10-CM | POA: Diagnosis not present

## 2021-10-03 DIAGNOSIS — I480 Paroxysmal atrial fibrillation: Secondary | ICD-10-CM | POA: Diagnosis not present

## 2021-10-09 ENCOUNTER — Ambulatory Visit (HOSPITAL_BASED_OUTPATIENT_CLINIC_OR_DEPARTMENT_OTHER): Payer: Medicare Other | Admitting: Physical Therapy

## 2021-10-14 ENCOUNTER — Ambulatory Visit (HOSPITAL_BASED_OUTPATIENT_CLINIC_OR_DEPARTMENT_OTHER): Payer: Medicare Other | Attending: Geriatric Medicine | Admitting: Physical Therapy

## 2021-10-14 ENCOUNTER — Other Ambulatory Visit: Payer: Self-pay

## 2021-10-14 ENCOUNTER — Encounter (HOSPITAL_BASED_OUTPATIENT_CLINIC_OR_DEPARTMENT_OTHER): Payer: Self-pay | Admitting: Physical Therapy

## 2021-10-14 DIAGNOSIS — R2689 Other abnormalities of gait and mobility: Secondary | ICD-10-CM | POA: Diagnosis not present

## 2021-10-14 NOTE — Therapy (Signed)
?OUTPATIENT PHYSICAL THERAPY LOWER EXTREMITY EVALUATION ? ? ?Patient Name: Katherine Cardenas ?MRN: [id number] ?DOB:[birth year], 81 y.o., female ?Today's Date: 10/14/2021 ? ? PT End of Session - 10/14/21 1153   ? ? Visit Number 2   ? Number of Visits 12   ? Date for PT Re-Evaluation 10/25/21   ? Authorization Type Medicare A and B   ? PT Start Time 1145   ? PT Stop Time 1228   ? PT Time Calculation (min) 43 min   ? Activity Tolerance Patient tolerated treatment well   ? Behavior During Therapy [facility] for tasks assessed/performed   ? ?  ?  ? ?  ? ? ? ?Past Medical History:  ?Diagnosis Date  ? Anxiety   ? Atrial fibrillation ([place])   ? Atrial tachycardia ([place])   ? Depression   ? Facial spasm   ? Hypertension   ? Tachy-brady syndrome ([place])   ? a. s/p MDT His Bundle pacemaker implant 2018 - Dr Caryl Comes  ? ?Past Surgical History:  ?Procedure Laterality Date  ? APPENDECTOMY    ? age 79  ? CARDIAC CATHETERIZATION N/A 12/15/2014  ? Procedure: Left Heart Cath and Coronary Angiography;  Surgeon: Burnell Blanks, MD;  Location: [place] CV LAB;  Service: Cardiovascular;  Laterality: N/A;  ? CATARACT EXTRACTION    ? Right Eye  ? DILATION AND CURETTAGE OF UTERUS  1990's  ? benign polyp  ? PACEMAKER IMPLANT N/A 02/16/2017  ? Procedure: Pacemaker Implant;  Surgeon: Deboraha Sprang, MD;  Location: [place] CV LAB;  Service: Cardiovascular;  Laterality: N/A;  ? TONSILLECTOMY AND ADENOIDECTOMY    ? --age 71  ? ?Patient Active Problem List  ? Diagnosis Date Noted  ? Pacemaker 12/06/2020  ? Syncope 12/07/2016  ? Sinus node dysfunction ([place]) 12/07/2016  ? Paroxysmal atrial fibrillation ([place]) 12/07/2016  ? Hyperlipidemia 01/18/2016  ? Atrial tachycardia ([place]) 02/13/2015  ? Essential hypertension 02/13/2015  ? Chronic coronary artery disease 12/15/2014  ? ? ?PCP: Lajean Manes, MD ? ?REFERRING PROVIDER: Lajean Manes, MD ? ?REFERRING DIAG: R26.9 (ICD-10-CM) - Unspecified abnormalities of gait and mobility ?THERAPY DIAG:  ?R26.9 (ICD-10-CM) -  Unspecified abnormalities of gait and mobility ? ?ONSET DATE: decreasing mobility over time ? ?SUBJECTIVE:  ? ?SUBJECTIVE STATEMENT: ?Patient reports over the past 2 years she has had a significant decrease in her overall mobility. She had a fall last year walking her dog. She hit her head. Overall she has had a lot of life events that have kept her from leaving the house much. She feels this effects her general mobility. She came in for an intial eval on 11/7. We had some scheduling issues and with the holidays she was unable to come in. She comes in today having completed some of the exercises that she was given on the evaluation.  ? ?PERTINENT HISTORY: ?Anxiety, Tachycardia, depression, facial spasm,  ? ? ?PAIN:  ?Has a history of neck and upper back pain; fingers can become numbness and tingling ? ?PRECAUTIONS: pacemaker  ? ? ?WEIGHT BEARING RESTRICTIONS No ? ?FALLS:  ?Has patient fallen in last 6 months? No, Number of falls:  ?She has had several near falls  ? ?LIVING ENVIRONMENT: ?A few steps in from the garage. 2 story house but doesn't need to go up often.  ? ?OCCUPATION:  ? ?Recreation: the patient would like to be able to walk her dog.  ? ?PLOF: Independent with basic ADLs ? ?PATIENT GOALS  ? ?To improve balance  and ability to walk her dog ? ? ?OBJECTIVE:  ? ? ? ? ?TODAY'S TREATMENT: ?3/6 ?Bilateral ER red 2x15  ?Horizontal abduction 2x15 red  ?Horizontaal abduction with flexion 2x10 red  ? ?Seated hip abduction 2x15 red hold  ? ?Seated LAQ 2x15 red  ?Seated March 2x10 red  ? ?Narrow base x30 sec hold  ?Narrow base eyes closed 2x30 sec with min a for balance on the first trial CGA on the second.  ? ?Tandem stance 2x30 sec each leg  ?Tandem eyes closed min a 2x30 sec hold.  ? ? ? ?Eval:  ?Exercises ?Laq 3x10 red  ?Hip abduction 3x10 red  ?Seated march 3x10 red  ? ? ? ? ?PATIENT EDUCATION:  ?Education details: reviewed benefits of balance training; reviewed  ?Person educated: Patient ?Education method:  Explanation, Demonstration, Tactile cues, Verbal cues, and Handouts ?Education comprehension: verbalized understanding, returned demonstration, verbal cues required, tactile cues required, and needs further education ? ? ?HOME EXERCISE PROGRAM: ?Access Code: [email or web address] ?URL: [email or web address] ?Date: 06/18/2021 ?Prepared by: Carolyne Littles ? ?Exercises ?Shoulder extension with resistance - Neutral - 1 x daily - 7 x weekly - 3 sets - 10 reps ?Scapular Retraction with Resistance - 1 x daily - 7 x weekly - 3 sets - 10 reps ?Shoulder External Rotation and Scapular Retraction with Resistance - 1 x daily - 7 x weekly - 3 sets - 10 reps ? ? ?ASSESSMENT: ? ?CLINICAL IMPRESSION: ?This is the first time the patient has been able to get consistent treatment 2nd to PT's schedule. She has worked on some of her exercises at home. Therapy reviewed her HEP with her. We expanded upon her sitting series of exercises. She tolerated well. We also started balance training. She required increased assist with any exercises that involved her eyes being closed. Therapy will continue to expand her program over the next few visits. She is concerned because her neighbors dog keeps runing at her. She was advised she may have to talk to her neighbors or the dog warden.  ? ?Abnormal gait, decreased activity tolerance, decreased cognition, decreased endurance, difficulty walking, decreased strength, and pain ? ?cleaning, community activity, driving, meal prep, laundry, and yard work ? ?Other factors: Anxiety, Tachycardia, depression,  ? ?REHAB POTENTIAL: Good ? ?CLINICAL DECISION MAKING: Evolving/moderate complexity decreasing mobility over time  ? ?EVALUATION COMPLEXITY: Moderate ? ? ?GOALS: ?Goals reviewed with patient? Yes All goals remain consistent  ? ?SHORT TERM GOALS: ? ?STG Name Target Date Goal status  ?1 Patient will increase her BERG score by 6 points  ?Baseline:  11/04/2021 INITIAL  ?2 Patient will increase general hip  strength by 5lbs bilateral  ?Baseline:  11/04/2021 INITIAL  ?3 Patient will be independent with basic HEP for balance and strengthening  ?Baseline: 11/04/2021 INITIAL  ?LONG TERM GOALS:  ? ?LTG Name Target Date Goal status  ?1 Patient will return to walking her dog without fear of falling  ?Baseline: 11/25/2021 INITIAL  ?2 Patient will ambulate with improved hip flexion in order to improve her ability to ambulate without tripping.  ?Baseline: 11/25/2021 INITIAL  ?3 Patient will be independent with complete HEP for balance and strengthening  ?Baseline: 11/25/2021 INITIAL  ? ?PLAN: ?PT FREQUENCY: 2x/week ? ?PT DURATION: 6 weeks ? ?PLANNED INTERVENTIONS: Therapeutic exercises, Therapeutic activity, Neuro Muscular re-education, Balance training, Gait training, Patient/Family education, Joint mobilization, Stair training, DME instructions, Aquatic Therapy, Cryotherapy, Moist heat, Taping, and Manual therapy ? ?PLAN FOR NEXT SESSION: begin balance training consider tandem stance and narrow  base; consider hurdles; consider progression to air-ex; consider forward and backwards balance ? ? ?Carney Living PT DPT  ?10/14/2021, 11:57 AM  ?

## 2021-10-21 ENCOUNTER — Encounter (HOSPITAL_BASED_OUTPATIENT_CLINIC_OR_DEPARTMENT_OTHER): Payer: Self-pay | Admitting: Physical Therapy

## 2021-10-21 ENCOUNTER — Ambulatory Visit (HOSPITAL_BASED_OUTPATIENT_CLINIC_OR_DEPARTMENT_OTHER): Payer: Medicare Other | Admitting: Physical Therapy

## 2021-10-21 ENCOUNTER — Other Ambulatory Visit: Payer: Self-pay

## 2021-10-21 DIAGNOSIS — R2689 Other abnormalities of gait and mobility: Secondary | ICD-10-CM

## 2021-10-21 NOTE — Therapy (Signed)
?OUTPATIENT PHYSICAL THERAPY LOWER EXTREMITY EVALUATION ? ? ?Patient Name: Katherine Cardenas ?MRN: [id number] ?DOB:August 30, 1940, 81 y.o., female ?Today's Date: 10/21/2021 ? ? PT End of Session - 10/21/21 1251   ? ? Visit Number 3   ? Number of Visits 12   ? Date for PT Re-Evaluation 10/25/21   ? Authorization Type Medicare A and B   ? PT Start Time 1100   ? PT Stop Time 5465   ? PT Time Calculation (min) 42 min   ? Activity Tolerance Patient tolerated treatment well   ? Behavior During Therapy [facility] for tasks assessed/performed   ? ?  ?  ? ?  ? ? ? ? ?Past Medical History:  ?Diagnosis Date  ? Anxiety   ? Atrial fibrillation ([place])   ? Atrial tachycardia ([place])   ? Depression   ? Facial spasm   ? Hypertension   ? Tachy-brady syndrome ([place])   ? a. s/p MDT His Bundle pacemaker implant 2018 - Dr Caryl Comes  ? ?Past Surgical History:  ?Procedure Laterality Date  ? APPENDECTOMY    ? age 32  ? CARDIAC CATHETERIZATION N/A 12/15/2014  ? Procedure: Left Heart Cath and Coronary Angiography;  Surgeon: Burnell Blanks, MD;  Location: [place] CV LAB;  Service: Cardiovascular;  Laterality: N/A;  ? CATARACT EXTRACTION    ? Right Eye  ? DILATION AND CURETTAGE OF UTERUS  1990's  ? benign polyp  ? PACEMAKER IMPLANT N/A 02/16/2017  ? Procedure: Pacemaker Implant;  Surgeon: Deboraha Sprang, MD;  Location: [place] CV LAB;  Service: Cardiovascular;  Laterality: N/A;  ? TONSILLECTOMY AND ADENOIDECTOMY    ? --age 6  ? ?Patient Active Problem List  ? Diagnosis Date Noted  ? Pacemaker 12/06/2020  ? Syncope 12/07/2016  ? Sinus node dysfunction ([place]) 12/07/2016  ? Paroxysmal atrial fibrillation ([place]) 12/07/2016  ? Hyperlipidemia 01/18/2016  ? Atrial tachycardia ([place]) 02/13/2015  ? Essential hypertension 02/13/2015  ? Chronic coronary artery disease 12/15/2014  ? ? ?PCP: Lajean Manes, MD ? ?REFERRING PROVIDER: Lajean Manes, MD ? ?REFERRING DIAG: R26.9 (ICD-10-CM) - Unspecified abnormalities of gait and mobility ?THERAPY DIAG:  ?R26.9 (ICD-10-CM)  - Unspecified abnormalities of gait and mobility ? ?ONSET DATE: decreasing mobility over time ? ?SUBJECTIVE:  ? ?SUBJECTIVE STATEMENT: ?The patient reports that she did well after the last visit. She has been working on her exercises.  ?PERTINENT HISTORY: ?Anxiety, Tachycardia, depression, facial spasm,  ? ? ?PAIN:  ?Has a history of neck and upper back pain; fingers can become numbness and tingling ? ?PRECAUTIONS: pacemaker  ? ? ?WEIGHT BEARING RESTRICTIONS No ? ?FALLS:  ?Has patient fallen in last 6 months? No, Number of falls:  ?She has had several near falls  ? ?LIVING ENVIRONMENT: ?A few steps in from the garage. 2 story house but doesn't need to go up often.  ? ?OCCUPATION:  ? ?Recreation: the patient would like to be able to walk her dog.  ? ?PLOF: Independent with basic ADLs ? ?PATIENT GOALS  ? ?To improve balance and ability to walk her dog ? ? ?OBJECTIVE:  ? ? ? ? ?TODAY'S TREATMENT: ?3/13 ?Nu-step 5 min  ?Supine march 2x10  ?SLR 2x10 each leg  ?Bridge 2x10  ? ? ?Narrow base x30 sec hold  ?Narrow base eyes closed 2x30 SBA for both trials  ? ?Tandem stance eyes closed min a bilateral 2x20 sec hold  ? ?Air-ex min a standing balance with narrow base eyes open  ?Air-ex hell raise  toe raise with min a for toe raise  ? ? ?3/6 ?Bilateral ER red 2x15  ?Horizontal abduction 2x15 red  ?Horizontaal abduction with flexion 2x10 red  ? ?Seated hip abduction 2x15 red hold  ? ?Seated LAQ 2x15 red  ?Seated March 2x10 red  ? ?Narrow base x30 sec hold  ?Narrow base eyes closed 2x30 sec with min a for balance on the first trial CGA on the second.  ? ?Tandem stance 2x30 sec each leg  ?Tandem eyes closed min a 2x30 sec hold.  ? ? ? ?Eval:  ?Exercises ?Laq 3x10 red  ?Hip abduction 3x10 red  ?Seated march 3x10 red  ? ? ? ? ?PATIENT EDUCATION:  ?Education details: reviewed benefits of balance training; reviewed  ?Person educated: Patient ?Education method: Explanation, Demonstration, Tactile cues, Verbal cues, and  Handouts ?Education comprehension: verbalized understanding, returned demonstration, verbal cues required, tactile cues required, and needs further education ? ? ?HOME EXERCISE PROGRAM: ?Access Code: [email or web address] ?URL: [email or web address] ?Date: 06/18/2021 ?Prepared by: Carolyne Littles ? ?Exercises ?Shoulder extension with resistance - Neutral - 1 x daily - 7 x weekly - 3 sets - 10 reps ?Scapular Retraction with Resistance - 1 x daily - 7 x weekly - 3 sets - 10 reps ?Shoulder External Rotation and Scapular Retraction with Resistance - 1 x daily - 7 x weekly - 3 sets - 10 reps ? ? ?ASSESSMENT: ? ?CLINICAL IMPRESSION: ?The patient tolerated treatment well. She was given a supine series of exercises. She was given an updated HEP. She was advised to pick a certain series of exercises. She was told on the last visit to hold on the balance exercises until she practiced more here. She worked on them at home safely per patient report. She did show improved narrow base of support. Therapy will progress as tolerated.  ?Abnormal gait, decreased activity tolerance, decreased cognition, decreased endurance, difficulty walking, decreased strength, and pain ? ?cleaning, community activity, driving, meal prep, laundry, and yard work ? ?Other factors: Anxiety, Tachycardia, depression,  ? ?REHAB POTENTIAL: Good ? ?CLINICAL DECISION MAKING: Evolving/moderate complexity decreasing mobility over time  ? ?EVALUATION COMPLEXITY: Moderate ? ? ?GOALS: ?Goals reviewed with patient? Yes All goals remain consistent  ? ?SHORT TERM GOALS: ? ?STG Name Target Date Goal status  ?1 Patient will increase her BERG score by 6 points  ?Baseline:  11/11/2021 INITIAL  ?2 Patient will increase general hip strength by 5lbs bilateral  ?Baseline:  11/11/2021 INITIAL  ?3 Patient will be independent with basic HEP for balance and strengthening  ?Baseline: 11/11/2021 INITIAL  ?LONG TERM GOALS:  ? ?LTG Name Target Date Goal status  ?1 Patient will return to  walking her dog without fear of falling  ?Baseline: 12/02/2021 INITIAL  ?2 Patient will ambulate with improved hip flexion in order to improve her ability to ambulate without tripping.  ?Baseline: 12/02/2021 INITIAL  ?3 Patient will be independent with complete HEP for balance and strengthening  ?Baseline: 12/02/2021 INITIAL  ? ?PLAN: ?PT FREQUENCY: 2x/week ? ?PT DURATION: 6 weeks ? ?PLANNED INTERVENTIONS: Therapeutic exercises, Therapeutic activity, Neuro Muscular re-education, Balance training, Gait training, Patient/Family education, Joint mobilization, Stair training, DME instructions, Aquatic Therapy, Cryotherapy, Moist heat, Taping, and Manual therapy ? ?PLAN FOR NEXT SESSION: begin balance training consider tandem stance and narrow base; consider hurdles; consider progression to air-ex; consider forward and backwards balance ? ? ?Carney Living PT DPT  ?10/21/2021, 4:52 PM  ?

## 2021-10-28 ENCOUNTER — Other Ambulatory Visit: Payer: Self-pay

## 2021-10-28 ENCOUNTER — Encounter (HOSPITAL_BASED_OUTPATIENT_CLINIC_OR_DEPARTMENT_OTHER): Payer: Self-pay | Admitting: Physical Therapy

## 2021-10-28 ENCOUNTER — Ambulatory Visit (HOSPITAL_BASED_OUTPATIENT_CLINIC_OR_DEPARTMENT_OTHER): Payer: Medicare Other | Admitting: Physical Therapy

## 2021-10-28 DIAGNOSIS — R2689 Other abnormalities of gait and mobility: Secondary | ICD-10-CM

## 2021-10-28 NOTE — Therapy (Signed)
?OUTPATIENT PHYSICAL THERAPY LOWER EXTREMITY EVALUATION ? ? ?Patient Name: Katherine Cardenas ?MRN: [id number] ?DOB:April 05, 1941, 81 y.o., female ?Today's Date: 10/28/2021 ? ? PT End of Session - 10/28/21 1346   ? ? Visit Number 4   ? Number of Visits 12   ? Date for PT Re-Evaluation 10/25/21   ? Authorization Type Medicare A and B   ? PT Start Time 1100   ? PT Stop Time 6568   ? PT Time Calculation (min) 42 min   ? Activity Tolerance Patient tolerated treatment well   ? Behavior During Therapy [facility] for tasks assessed/performed   ? ?  ?  ? ?  ? ? ? ? ? ?Past Medical History:  ?Diagnosis Date  ? Anxiety   ? Atrial fibrillation ([place])   ? Atrial tachycardia ([place])   ? Depression   ? Facial spasm   ? Hypertension   ? Tachy-brady syndrome ([place])   ? a. s/p MDT His Bundle pacemaker implant 2018 - Dr Caryl Comes  ? ?Past Surgical History:  ?Procedure Laterality Date  ? APPENDECTOMY    ? age 61  ? CARDIAC CATHETERIZATION N/A 12/15/2014  ? Procedure: Left Heart Cath and Coronary Angiography;  Surgeon: Burnell Blanks, MD;  Location: [place] [facility];  Service: Cardiovascular;  Laterality: N/A;  ? CATARACT EXTRACTION    ? Right Eye  ? DILATION AND CURETTAGE OF UTERUS  1990's  ? benign polyp  ? PACEMAKER IMPLANT N/A 02/16/2017  ? Procedure: Pacemaker Implant;  Surgeon: Deboraha Sprang, MD;  Location: [place] [facility];  Service: Cardiovascular;  Laterality: N/A;  ? TONSILLECTOMY AND ADENOIDECTOMY    ? --age 66  ? ?Patient Active Problem List  ? Diagnosis Date Noted  ? Pacemaker 12/06/2020  ? Syncope 12/07/2016  ? Sinus node dysfunction ([place]) 12/07/2016  ? Paroxysmal atrial fibrillation ([place]) 12/07/2016  ? Hyperlipidemia 01/18/2016  ? Atrial tachycardia ([place]) 02/13/2015  ? Essential hypertension 02/13/2015  ? Chronic coronary artery disease 12/15/2014  ? ? ?PCP: Lajean Manes, MD ? ?REFERRING PROVIDER: Lajean Manes, MD ? ?REFERRING DIAG: R26.9 (ICD-10-CM) - Unspecified abnormalities of gait and mobility ?THERAPY DIAG:  ?R26.9  (ICD-10-CM) - Unspecified abnormalities of gait and mobility ? ?ONSET DATE: decreasing mobility over time ? ?SUBJECTIVE:  ? ?SUBJECTIVE STATEMENT: ?The patient reports that she did well after the last visit. She has been working on her exercises.  ?PERTINENT HISTORY: ?Anxiety, Tachycardia, depression, facial spasm,  ? ? ?PAIN:  ?Has a history of neck and upper back pain; fingers can become numbness and tingling ? ?PRECAUTIONS: pacemaker  ? ? ?WEIGHT BEARING RESTRICTIONS No ? ?FALLS:  ?Has patient fallen in last 6 months? No, Number of falls:  ?She has had several near falls  ? ?LIVING ENVIRONMENT: ?A few steps in from the garage. 2 story house but doesn't need to go up often.  ? ?OCCUPATION:  ? ?Recreation: the patient would like to be able to walk her dog.  ? ?PLOF: Independent with basic ADLs ? ?PATIENT GOALS  ? ?To improve balance and ability to walk her dog ? ? ?OBJECTIVE:  ? ? ? ? ?TODAY'S TREATMENT: ?3/20 ?Nu-step L3 5 min  ? ?Standing heel raise x15  ?Standing march 2x15  ?Standing hip extension x10  ?Standing march x10  ? ?Tandem stance eyes closed min a bilateral 2x20 sec hold  ? ?Air-ex SBA standing balance with narrow base eyes open 2x30 sec hold  ?Air-ex narrow base 2x30 sec hod eyes closed  ? ?  Air-ex tandem eyes open and eyes closed min a eyes closed SBA eyes open 2x20 sec hold ? ?Step over hurdle 2x10  ? ? ? ? ?3/13 ?Nu-step 5 min  ?Supine march 2x10  ?SLR 2x10 each leg  ?Bridge 2x10  ? ? ?Narrow base x30 sec hold  ?Narrow base eyes closed 2x30 SBA for both trials  ? ?Tandem stance eyes closed min a bilateral 2x20 sec hold  ? ?Air-ex min a standing balance with narrow base eyes open  ?Air-ex hell raise toe raise with min a for toe raise  ? ? ?3/6 ?Bilateral ER red 2x15  ?Horizontal abduction 2x15 red  ?Horizontaal abduction with flexion 2x10 red  ? ?Seated hip abduction 2x15 red hold  ? ?Seated LAQ 2x15 red  ?Seated March 2x10 red  ? ?Narrow base x30 sec hold  ?Narrow base eyes closed 2x30 sec with min  a for balance on the first trial CGA on the second.  ? ?Tandem stance 2x30 sec each leg  ?Tandem eyes closed min a 2x30 sec hold.  ? ? ? ?Eval:  ?Exercises ?Laq 3x10 red  ?Hip abduction 3x10 red  ?Seated march 3x10 red  ? ? ? ? ?PATIENT EDUCATION:  ?Education details: reviewed benefits of balance training; reviewed  ?Person educated: Patient ?Education method: Explanation, Demonstration, Tactile cues, Verbal cues, and Handouts ?Education comprehension: verbalized understanding, returned demonstration, verbal cues required, tactile cues required, and needs further education ? ? ?HOME EXERCISE PROGRAM: ?Access Code: [email or web address] ?URL: [email or web address] ?Date: 06/18/2021 ?Prepared by: Carolyne Littles ? ?Exercises ?Shoulder extension with resistance - Neutral - 1 x daily - 7 x weekly - 3 sets - 10 reps ?Scapular Retraction with Resistance - 1 x daily - 7 x weekly - 3 sets - 10 reps ?Shoulder External Rotation and Scapular Retraction with Resistance - 1 x daily - 7 x weekly - 3 sets - 10 reps ? ? ?ASSESSMENT: ? ?CLINICAL IMPRESSION: ?The patients balance is improving. She required less assist on the air-ex today. We also added in the hurdle. She has an air-ex at home now. We talked to her about safety with balance exercises. She does a good job keeping her hands close to the sink. We added in a standing series of exercises. She was advised to pick 1-2 sets of exercises and maybe a balance exercise to work on daily. Therapy will continue to progress as tolerated.  ? ?Abnormal gait, decreased activity tolerance, decreased cognition, decreased endurance, difficulty walking, decreased strength, and pain ? ?cleaning, community activity, driving, meal prep, laundry, and yard work ? ?Other factors: Anxiety, Tachycardia, depression,  ? ?REHAB POTENTIAL: Good ? ?CLINICAL DECISION MAKING: Evolving/moderate complexity decreasing mobility over time  ? ?EVALUATION COMPLEXITY: Moderate ? ? ?GOALS: ?Goals reviewed with  patient? Yes All goals remain consistent  ? ?SHORT TERM GOALS: ? ?STG Name Target Date Goal status  ?1 Patient will increase her BERG score by 6 points  ?Baseline:  11/18/2021 INITIAL  ?2 Patient will increase general hip strength by 5lbs bilateral  ?Baseline:  11/18/2021 INITIAL  ?3 Patient will be independent with basic HEP for balance and strengthening  ?Baseline: 11/18/2021 INITIAL  ?LONG TERM GOALS:  ? ?LTG Name Target Date Goal status  ?1 Patient will return to walking her dog without fear of falling  ?Baseline: 12/09/2021 INITIAL  ?2 Patient will ambulate with improved hip flexion in order to improve her ability to ambulate without tripping.  ?Baseline: 12/09/2021 INITIAL  ?3 Patient will be independent with complete HEP  for balance and strengthening  ?Baseline: 12/09/2021 INITIAL  ? ?PLAN: ?PT FREQUENCY: 2x/week ? ?PT DURATION: 6 weeks ? ?PLANNED INTERVENTIONS: Therapeutic exercises, Therapeutic activity, Neuro Muscular re-education, Balance training, Gait training, Patient/Family education, Joint mobilization, Stair training, DME instructions, Aquatic Therapy, Cryotherapy, Moist heat, Taping, and Manual therapy ? ?PLAN FOR NEXT SESSION: begin balance training consider tandem stance and narrow base; consider hurdles; consider progression to air-ex; consider forward and backwards balance ? ? ?Carney Living PT DPT  ?10/28/2021, 1:53 PM  ?

## 2021-11-14 ENCOUNTER — Ambulatory Visit (HOSPITAL_BASED_OUTPATIENT_CLINIC_OR_DEPARTMENT_OTHER): Payer: Medicare Other | Attending: Geriatric Medicine | Admitting: Physical Therapy

## 2021-11-14 DIAGNOSIS — R2689 Other abnormalities of gait and mobility: Secondary | ICD-10-CM | POA: Diagnosis not present

## 2021-11-14 NOTE — Therapy (Signed)
?OUTPATIENT PHYSICAL THERAPY LOWER EXTREMITY EVALUATION/Progress Note ? ? ?Patient Name: Katherine Cardenas ?MRN: [id number] ?DOB:01-Sep-1940, 81 y.o., female ?Today's Date: 11/15/2021 ? ? PT End of Session - 11/14/21 1204   ? ? Visit Number 5   ? Number of Visits 13   ? Date for PT Re-Evaluation 01/10/22   ? Authorization Type Medicare A and B   ? PT Start Time 1145   ? PT Stop Time 1228   ? PT Time Calculation (min) 43 min   ? Activity Tolerance Patient tolerated treatment well   ? Behavior During Therapy [facility] for tasks assessed/performed   ? ?  ?  ? ?  ? ?Progress Note ?Reporting Period 09/13/2031 to 11/14/2021 ? ?See note below for Objective Data and Assessment of Progress/Goals.  ? ?  ? ? ? ?Past Medical History:  ?Diagnosis Date  ? Anxiety   ? Atrial fibrillation ([place])   ? Atrial tachycardia ([place])   ? Depression   ? Facial spasm   ? Hypertension   ? Tachy-brady syndrome ([place])   ? a. s/p MDT His Bundle pacemaker implant 2018 - Dr Caryl Comes  ? ?Past Surgical History:  ?Procedure Laterality Date  ? APPENDECTOMY    ? age 67  ? CARDIAC CATHETERIZATION N/A 12/15/2014  ? Procedure: Left Heart Cath and Coronary Angiography;  Surgeon: Burnell Blanks, MD;  Location: [place] [facility];  Service: Cardiovascular;  Laterality: N/A;  ? CATARACT EXTRACTION    ? Right Eye  ? DILATION AND CURETTAGE OF UTERUS  1990's  ? benign polyp  ? PACEMAKER IMPLANT N/A 02/16/2017  ? Procedure: Pacemaker Implant;  Surgeon: Deboraha Sprang, MD;  Location: [place] [facility];  Service: Cardiovascular;  Laterality: N/A;  ? TONSILLECTOMY AND ADENOIDECTOMY    ? --age 62  ? ?Patient Active Problem List  ? Diagnosis Date Noted  ? Pacemaker 12/06/2020  ? Syncope 12/07/2016  ? Sinus node dysfunction ([place]) 12/07/2016  ? Paroxysmal atrial fibrillation ([place]) 12/07/2016  ? Hyperlipidemia 01/18/2016  ? Atrial tachycardia ([place]) 02/13/2015  ? Essential hypertension 02/13/2015  ? Chronic coronary artery disease 12/15/2014  ? ? ?PCP: Lajean Manes, MD ? ?REFERRING  PROVIDER: Lajean Manes, MD ? ?REFERRING DIAG: R26.9 (ICD-10-CM) - Unspecified abnormalities of gait and mobility ?THERAPY DIAG:  ?R26.9 (ICD-10-CM) - Unspecified abnormalities of gait and mobility ? ?ONSET DATE: decreasing mobility over time ? ?SUBJECTIVE:  ? ?SUBJECTIVE STATEMENT: ?The patient reports that she did well after the last visit. She has been working on her exercises.  ?PERTINENT HISTORY: ?Anxiety, Tachycardia, depression, facial spasm,  ? ? ?PAIN:  ?Has a history of neck and upper back pain; fingers can become numbness and tingling ? ?PRECAUTIONS: pacemaker  ? ? ?WEIGHT BEARING RESTRICTIONS No ? ?FALLS:  ?Has patient fallen in last 6 months? No, Number of falls:  ?She has had several near falls  ? ?LIVING ENVIRONMENT: ?A few steps in from the garage. 2 story house but doesn't need to go up often.  ? ?OCCUPATION:  ? ?Recreation: the patient would like to be able to walk her dog.  ? ?PLOF: Independent with basic ADLs ? ?PATIENT GOALS  ? ?To improve balance and ability to walk her dog ? ? ?OBJECTIVE:  ? ?LE MMT: ?  ?MMT Right ?09/12/2021 Left ?09/12/2021 Right  ?4/6   ?Hip flexion Initial 22.5 ?  ?2/3 16.4 20 ?  ?2/3 18.4 21.5 19.4  ?Hip extension        ?Hip abduction Initial 20.3 ?  ?  2/3 25.4 Initial 21.5 ?  ?  ?2/3 25.6 32.7 35  ?Hip adduction        ?Hip internal rotation        ?Hip external rotation        ?Knee flexion        ?Knee extension Initial 19.7 ?  ?2/3  ?30.7 ?  Initial ?26 ?  ?  ?2/3 ?24.6 28.8 38.2  ?Ankle dorsiflexion        ?Ankle plantarflexion        ?Ankle inversion        ?Ankle eversion        ? (Blank rows = not tested) ? ?BERG:  ?48  ? ? ?TODAY'S TREATMENT: ?4/7 ?BERG balance testing performed and strength testing. Reviewed for HEP ?BERG testing: 2 baseline 48 now  ? ?Supine march 2x10 ?Supine bridge: spent time reviewing tehcnique with bridge. Patient having some difficulty on a soft bed at home. Patient able to do well on the plynth  ?Supine hip abdcution 2x15 green  ?Nu-step L3  2x10  ? ? ?3/20 ?Nu-step L3 5 min  ? ?Standing heel raise x15  ?Standing march 2x15  ?Standing hip extension x10  ?Standing march x10  ? ?Tandem stance eyes closed min a bilateral 2x20 sec hold  ? ?Air-ex SBA standing balance with narrow base eyes open 2x30 sec hold  ?Air-ex narrow base 2x30 sec hod eyes closed  ? ?Air-ex tandem eyes open and eyes closed min a eyes closed SBA eyes open 2x20 sec hold ? ?Step over hurdle 2x10  ? ? ? ? ?3/13 ?Nu-step 5 min  ?Supine march 2x10  ?SLR 2x10 each leg  ?Bridge 2x10  ? ? ?Narrow base x30 sec hold  ?Narrow base eyes closed 2x30 SBA for both trials  ? ?Tandem stance eyes closed min a bilateral 2x20 sec hold  ? ?Air-ex min a standing balance with narrow base eyes open  ?Air-ex hell raise toe raise with min a for toe raise  ? ? ?3/6 ?Bilateral ER red 2x15  ?Horizontal abduction 2x15 red  ?Horizontaal abduction with flexion 2x10 red  ? ?Seated hip abduction 2x15 red hold  ? ?Seated LAQ 2x15 red  ?Seated March 2x10 red  ? ?Narrow base x30 sec hold  ?Narrow base eyes closed 2x30 sec with min a for balance on the first trial CGA on the second.  ? ?Tandem stance 2x30 sec each leg  ?Tandem eyes closed min a 2x30 sec hold.  ? ? ? ?Eval:  ?Exercises ?Laq 3x10 red  ?Hip abduction 3x10 red  ?Seated march 3x10 red  ? ? ? ? ?PATIENT EDUCATION:  ?Education details: reviewed benefits of balance training; reviewed  ?Person educated: Patient ?Education method: Explanation, Demonstration, Tactile cues, Verbal cues, and Handouts ?Education comprehension: verbalized understanding, returned demonstration, verbal cues required, tactile cues required, and needs further education ? ? ?HOME EXERCISE PROGRAM: ?Access Code: [email or web address] ?URL: [email or web address] ?Date: 06/18/2021 ?Prepared by: Carolyne Littles ? ?Exercises ?Shoulder extension with resistance - Neutral - 1 x daily - 7 x weekly - 3 sets - 10 reps ?Scapular Retraction with Resistance - 1 x daily - 7 x weekly - 3 sets - 10  reps ?Shoulder External Rotation and Scapular Retraction with Resistance - 1 x daily - 7 x weekly - 3 sets - 10 reps ? ? ?ASSESSMENT: ? ?CLINICAL IMPRESSION: ?Therapy performed a progress note on the patient today. Overall she is making progress. Her BERG score has improved. On intial testing  her strength had dipped from last year. It has returned to baseline in some cases and improved in others. She was encouraged by this. She has been consistent with her exercises at home. She was advised that strength and balance are a process and she is moving in the right direction. She would like to start walking a little in her neighborhood. She was advised to walk with someone and build up her distance. She will be seen every other week to continue to advance her HEP and review her exercises for home.  ?Abnormal gait, decreased activity tolerance, decreased cognition, decreased endurance, difficulty walking, decreased strength, and pain ? ?cleaning, community activity, driving, meal prep, laundry, and yard work ? ?Other factors: Anxiety, Tachycardia, depression,  ? ?REHAB POTENTIAL: Good ? ?CLINICAL DECISION MAKING: Evolving/moderate complexity decreasing mobility over time  ? ?EVALUATION COMPLEXITY: Moderate ? ? ?GOALS: ?Goals reviewed with patient? Yes All goals remain consistent  ? ?SHORT TERM GOALS: ? ?STG Name Target Date Goal status  ?1 Patient will increase her BERG score by 6 points  ?Baseline:  12/06/2021 INITIAL  ?2 Patient will increase general hip strength by 5lbs bilateral  ?Baseline:  12/06/2021 INITIAL  ?3 Patient will be independent with basic HEP for balance and strengthening  ?Baseline: 12/06/2021 INITIAL  ?LONG TERM GOALS:  ? ?LTG Name Target Date Goal status  ?1 Patient will return to walking her dog without fear of falling  ?Baseline: 12/27/2021 INITIAL  ?2 Patient will ambulate with improved hip flexion in order to improve her ability to ambulate without tripping.  ?Baseline: 12/27/2021 INITIAL  ?3 Patient  will be independent with complete HEP for balance and strengthening  ?Baseline: 12/27/2021 INITIAL  ? ?PLAN: ?PT FREQUENCY: 2x/week ? ?PT DURATION: 6 weeks ? ?PLANNED INTERVENTIONS: Therapeutic exercises, Therapeutic

## 2021-11-15 ENCOUNTER — Encounter (HOSPITAL_BASED_OUTPATIENT_CLINIC_OR_DEPARTMENT_OTHER): Payer: Self-pay | Admitting: Physical Therapy

## 2021-11-19 ENCOUNTER — Ambulatory Visit (INDEPENDENT_AMBULATORY_CARE_PROVIDER_SITE_OTHER): Payer: Medicare Other

## 2021-11-19 DIAGNOSIS — I495 Sick sinus syndrome: Secondary | ICD-10-CM

## 2021-11-19 LAB — CUP PACEART REMOTE DEVICE CHECK
Battery Remaining Longevity: 113 mo
Battery Voltage: 2.99 V
Brady Statistic AP VP Percent: 0.15 %
Brady Statistic AP VS Percent: 45.32 %
Brady Statistic AS VP Percent: 0.01 %
Brady Statistic AS VS Percent: 54.52 %
Brady Statistic RA Percent Paced: 46.75 %
Brady Statistic RV Percent Paced: 0.16 %
Date Time Interrogation Session: 20230410223535
Implantable Lead Implant Date: 20180709
Implantable Lead Implant Date: 20180709
Implantable Lead Location: 753859
Implantable Lead Location: 753860
Implantable Lead Model: 3830
Implantable Lead Model: 5076
Implantable Pulse Generator Implant Date: 20180709
Lead Channel Impedance Value: 266 Ohm
Lead Channel Impedance Value: 304 Ohm
Lead Channel Impedance Value: 361 Ohm
Lead Channel Impedance Value: 437 Ohm
Lead Channel Pacing Threshold Amplitude: 0.625 V
Lead Channel Pacing Threshold Pulse Width: 0.4 ms
Lead Channel Sensing Intrinsic Amplitude: 2.125 mV
Lead Channel Sensing Intrinsic Amplitude: 2.125 mV
Lead Channel Sensing Intrinsic Amplitude: 7.125 mV
Lead Channel Sensing Intrinsic Amplitude: 7.125 mV
Lead Channel Setting Pacing Amplitude: 1.5 V
Lead Channel Setting Pacing Amplitude: 2.5 V
Lead Channel Setting Pacing Pulse Width: 1 ms
Lead Channel Setting Sensing Sensitivity: 2 mV

## 2021-11-20 ENCOUNTER — Encounter (HOSPITAL_BASED_OUTPATIENT_CLINIC_OR_DEPARTMENT_OTHER): Payer: Medicare Other | Admitting: Physical Therapy

## 2021-11-25 ENCOUNTER — Encounter (HOSPITAL_BASED_OUTPATIENT_CLINIC_OR_DEPARTMENT_OTHER): Payer: Self-pay | Admitting: Physical Therapy

## 2021-11-25 ENCOUNTER — Ambulatory Visit (HOSPITAL_BASED_OUTPATIENT_CLINIC_OR_DEPARTMENT_OTHER): Payer: Medicare Other | Admitting: Physical Therapy

## 2021-11-25 DIAGNOSIS — R2689 Other abnormalities of gait and mobility: Secondary | ICD-10-CM

## 2021-11-25 NOTE — Therapy (Signed)
?OUTPATIENT PHYSICAL THERAPY LOWER EXTREMITY EVALUATION/Progress Note ? ? ?Patient Name: Katherine Cardenas ?MRN: [id number] ?DOB:November 30, 1940, 81 y.o., female ?Today's Date: 11/25/2021 ? ? PT End of Session - 11/25/21 1155   ? ? Visit Number 6   ? Number of Visits 13   ? Date for PT Re-Evaluation 01/10/22   ? Authorization Type Medicare A and B   ? PT Start Time 1145   ? PT Stop Time 1227   ? PT Time Calculation (min) 42 min   ? Activity Tolerance Patient tolerated treatment well   ? Behavior During Therapy [facility] for tasks assessed/performed   ? ?  ?  ? ?  ? ?Progress Note ?Reporting Period 09/13/2031 to 11/14/2021 ? ?See note below for Objective Data and Assessment of Progress/Goals.  ? ?  ? ? ? ?Past Medical History:  ?Diagnosis Date  ? Anxiety   ? Atrial fibrillation ([place])   ? Atrial tachycardia ([place])   ? Depression   ? Facial spasm   ? Hypertension   ? Tachy-brady syndrome ([place])   ? a. s/p MDT His Bundle pacemaker implant 2018 - Dr Caryl Comes  ? ?Past Surgical History:  ?Procedure Laterality Date  ? APPENDECTOMY    ? age 41  ? CARDIAC CATHETERIZATION N/A 12/15/2014  ? Procedure: Left Heart Cath and Coronary Angiography;  Surgeon: Burnell Blanks, MD;  Location: [place] [facility];  Service: Cardiovascular;  Laterality: N/A;  ? CATARACT EXTRACTION    ? Right Eye  ? DILATION AND CURETTAGE OF UTERUS  1990's  ? benign polyp  ? PACEMAKER IMPLANT N/A 02/16/2017  ? Procedure: Pacemaker Implant;  Surgeon: Deboraha Sprang, MD;  Location: [place] [facility];  Service: Cardiovascular;  Laterality: N/A;  ? TONSILLECTOMY AND ADENOIDECTOMY    ? --age 6  ? ?Patient Active Problem List  ? Diagnosis Date Noted  ? Pacemaker 12/06/2020  ? Syncope 12/07/2016  ? Sinus node dysfunction ([place]) 12/07/2016  ? Paroxysmal atrial fibrillation ([place]) 12/07/2016  ? Hyperlipidemia 01/18/2016  ? Atrial tachycardia ([place]) 02/13/2015  ? Essential hypertension 02/13/2015  ? Chronic coronary artery disease 12/15/2014  ? ? ?PCP: Lajean Manes, MD ? ?REFERRING  PROVIDER: Lajean Manes, MD ? ?REFERRING DIAG: R26.9 (ICD-10-CM) - Unspecified abnormalities of gait and mobility ?THERAPY DIAG:  ?R26.9 (ICD-10-CM) - Unspecified abnormalities of gait and mobility ? ?ONSET DATE: decreasing mobility over time ? ?SUBJECTIVE:  ? ?SUBJECTIVE STATEMENT: ?[place] patient has been working gon her exercises. She also has a balance device at home. Therapy looked at the device. It seems safe. She has been working on her bridges at home.  ? ?PERTINENT HISTORY: ?Anxiety, Tachycardia, depression, facial spasm,  ? ? ?PAIN:  ?Has a history of neck and upper back pain; fingers can become numbness and tingling ? ?PRECAUTIONS: pacemaker  ? ? ?WEIGHT BEARING RESTRICTIONS No ? ?FALLS:  ?Has patient fallen in last 6 months? No, Number of falls:  ?She has had several near falls  ? ?LIVING ENVIRONMENT: ?A few steps in from the garage. 2 story house but doesn't need to go up often.  ? ?OCCUPATION:  ? ?Recreation: the patient would like to be able to walk her dog.  ? ?PLOF: Independent with basic ADLs ? ?PATIENT GOALS  ? ?To improve balance and ability to walk her dog ? ? ?OBJECTIVE:  ? ?LE MMT: ?  ?MMT Right ?09/12/2021 Left ?09/12/2021 Right  ?4/6   ?Hip flexion Initial 22.5 ?  ?2/3 16.4 20 ?  ?2/3 18.4 21.5  19.4  ?Hip extension        ?Hip abduction Initial 20.3 ?  ?2/3 25.4 Initial 21.5 ?  ?  ?2/3 25.6 32.7 35  ?Hip adduction        ?Hip internal rotation        ?Hip external rotation        ?Knee flexion        ?Knee extension Initial 19.7 ?  ?2/3  ?30.7 ?  Initial ?26 ?  ?  ?2/3 ?24.6 28.8 38.2  ?Ankle dorsiflexion        ?Ankle plantarflexion        ?Ankle inversion        ?Ankle eversion        ? (Blank rows = not tested) ? ?BERG:  ?48  ? ? ?TODAY'S TREATMENT: ?4/17 ?Hurdle walk 4 laps 4 hurdles ?Hurdle side step 4 laps  ? ?Hurdle in and out walk x4 laps  ?Sit to stand 3x5 with cuing and educaton on standing  ? ?Seated bilateral er 2x15 red  ?Seated flexion with horizontal abduction 2x15 red  ?Seated  horizontal abduction 2x15  ? ?Seated nu-step 5 min  ?UE/LE  ? ? ?4/7 ?BERG balance testing performed and strength testing. Reviewed for HEP ?BERG testing: 2 baseline 48 now  ? ?Supine march 2x10 ?Supine bridge: spent time reviewing tehcnique with bridge. Patient having some difficulty on a soft bed at home. Patient able to do well on the plynth  ?Supine hip abdcution 2x15 green  ?Nu-step L3 2x10  ? ? ?3/20 ?Nu-step L3 5 min  ? ?Standing heel raise x15  ?Standing march 2x15  ?Standing hip extension x10  ?Standing march x10  ? ?Tandem stance eyes closed min a bilateral 2x20 sec hold  ? ?Air-ex SBA standing balance with narrow base eyes open 2x30 sec hold  ?Air-ex narrow base 2x30 sec hod eyes closed  ? ?Air-ex tandem eyes open and eyes closed min a eyes closed SBA eyes open 2x20 sec hold ? ?Step over hurdle 2x10  ? ? ? ? ? ? ? ?PATIENT EDUCATION:  ?Education details: reviewed benefits of balance training; reviewed  ?Person educated: Patient ?Education method: Explanation, Demonstration, Tactile cues, Verbal cues, and Handouts ?Education comprehension: verbalized understanding, returned demonstration, verbal cues required, tactile cues required, and needs further education ? ? ?HOME EXERCISE PROGRAM: ?Access Code: [email or web address] ?URL: [email or web address] ?Date: 06/18/2021 ?Prepared by: Carolyne Littles ? ?Exercises ?Shoulder extension with resistance - Neutral - 1 x daily - 7 x weekly - 3 sets - 10 reps ?Scapular Retraction with Resistance - 1 x daily - 7 x weekly - 3 sets - 10 reps ?Shoulder External Rotation and Scapular Retraction with Resistance - 1 x daily - 7 x weekly - 3 sets - 10 reps ? ? ?ASSESSMENT: ? ?CLINICAL IMPRESSION: ?Patient worked on sit to stand transfers today. She required cuing to get her feet under her and weight shift her noes over toes. When she did this  she was able to transfer easily. We reviewed with her for her home program. She was also given an UE strengthening swries and postural  series. She is doing well. We will continue to progress as tolerated.  ? ? ?cleaning, community activity, driving, meal prep, laundry, and yard work ? ?Other factors: Anxiety, Tachycardia, depression,  ? ?REHAB POTENTIAL: Good ? ?CLINICAL DECISION MAKING: Evolving/moderate complexity decreasing mobility over time  ? ?EVALUATION COMPLEXITY: Moderate ? ? ?GOALS: ?Goals reviewed with patient? Yes All goals remain consistent  ? ?  SHORT TERM GOALS: ? ?STG Name Target Date Goal status  ?1 Patient will increase her BERG score by 6 points  ?Baseline:  12/16/2021 INITIAL  ?2 Patient will increase general hip strength by 5lbs bilateral  ?Baseline:  12/16/2021 INITIAL  ?3 Patient will be independent with basic HEP for balance and strengthening  ?Baseline: 12/16/2021 INITIAL  ?LONG TERM GOALS:  ? ?LTG Name Target Date Goal status  ?1 Patient will return to walking her dog without fear of falling  ?Baseline: 01/06/2022 INITIAL  ?2 Patient will ambulate with improved hip flexion in order to improve her ability to ambulate without tripping.  ?Baseline: 01/06/2022 INITIAL  ?3 Patient will be independent with complete HEP for balance and strengthening  ?Baseline: 01/06/2022 INITIAL  ? ?PLAN: ?PT FREQUENCY: 2x/week ? ?PT DURATION: 6 weeks ? ?PLANNED INTERVENTIONS: Therapeutic exercises, Therapeutic activity, Neuro Muscular re-education, Balance training, Gait training, Patient/Family education, Joint mobilization, Stair training, DME instructions, Aquatic Therapy, Cryotherapy, Moist heat, Taping, and Manual therapy ? ?PLAN FOR NEXT SESSION: begin balance training consider tandem stance and narrow base; consider hurdles; consider progression to air-ex; consider forward and backwards balance ? ? ?Carney Living PT DPT  ?11/25/2021, 12:28 PM  ?

## 2021-11-27 DIAGNOSIS — R300 Dysuria: Secondary | ICD-10-CM | POA: Diagnosis not present

## 2021-12-02 ENCOUNTER — Other Ambulatory Visit: Payer: Self-pay | Admitting: *Deleted

## 2021-12-02 MED ORDER — PROPAFENONE HCL 150 MG PO TABS: ORAL_TABLET | ORAL | 0 refills | Status: DC

## 2021-12-05 NOTE — Progress Notes (Signed)
Remote pacemaker transmission.   

## 2021-12-09 ENCOUNTER — Encounter (HOSPITAL_BASED_OUTPATIENT_CLINIC_OR_DEPARTMENT_OTHER): Payer: Self-pay | Admitting: Physical Therapy

## 2021-12-09 ENCOUNTER — Ambulatory Visit (HOSPITAL_BASED_OUTPATIENT_CLINIC_OR_DEPARTMENT_OTHER): Payer: Medicare Other | Attending: Geriatric Medicine | Admitting: Physical Therapy

## 2021-12-09 DIAGNOSIS — R2689 Other abnormalities of gait and mobility: Secondary | ICD-10-CM | POA: Diagnosis not present

## 2021-12-09 NOTE — Therapy (Addendum)
OUTPATIENT PHYSICAL THERAPY LOWER EXTREMITY EVALUATION/Progress Note/discharge    Patient Name: Katherine Cardenas MRN: [id number] DOB:Nov 25, 1940, 81 y.o., female Today's Date: 12/09/2021   PT End of Session - 12/09/21 1153     Visit Number 7    Number of Visits 13    Date for PT Re-Evaluation 01/10/22    Authorization Type Medicare A and B    PT Start Time 2863    PT Stop Time 1228    PT Time Calculation (min) 43 min    Activity Tolerance Patient tolerated treatment well    Behavior During Therapy [facility] for tasks assessed/performed            Progress Note Reporting Period 09/13/2031 to 11/14/2021  See note below for Objective Data and Assessment of Progress/Goals.        Past Medical History:  Diagnosis Date   Anxiety    Atrial fibrillation (HCC)    Atrial tachycardia ([place])    Depression    Facial spasm    Hypertension    Tachy-brady syndrome ([place])    a. s/p MDT His Bundle pacemaker implant 2018 - Dr Caryl Comes   Past Surgical History:  Procedure Laterality Date   APPENDECTOMY     age 65   CARDIAC CATHETERIZATION N/A 12/15/2014   Procedure: Left Heart Cath and Coronary Angiography;  Surgeon: Burnell Blanks, MD;  Location: [facility];  Service: Cardiovascular;  Laterality: N/A;   CATARACT EXTRACTION     Right Eye   DILATION AND CURETTAGE OF UTERUS  1990's   benign polyp   PACEMAKER IMPLANT N/A 02/16/2017   Procedure: Pacemaker Implant;  Surgeon: Deboraha Sprang, MD;  Location: [facility];  Service: Cardiovascular;  Laterality: N/A;   TONSILLECTOMY AND ADENOIDECTOMY     --age 80   Patient Active Problem List   Diagnosis Date Noted   Pacemaker 12/06/2020   Syncope 12/07/2016   Sinus node dysfunction (HCC) 12/07/2016   Paroxysmal atrial fibrillation ([place]) 12/07/2016   Hyperlipidemia 01/18/2016   Atrial tachycardia ([place]) 02/13/2015   Essential hypertension 02/13/2015   Chronic coronary artery disease 12/15/2014    PCP: Lajean Manes,  MD  REFERRING PROVIDER: Lajean Manes, MD  REFERRING DIAG: R26.9 (ICD-10-CM) - Unspecified abnormalities of gait and mobility THERAPY DIAG:  R26.9 (ICD-10-CM) - Unspecified abnormalities of gait and mobility  ONSET DATE: decreasing mobility over time  SUBJECTIVE:   SUBJECTIVE STATEMENT: The patient did a lot of standing on concrete over the weekend. She had soreness and stiffness in both knees. It has improved somewhat.   PERTINENT HISTORY: Anxiety, Tachycardia, depression, facial spasm,    PAIN:  Has a history of neck and upper back pain; fingers can become numbness and tingling  PRECAUTIONS: pacemaker    WEIGHT BEARING RESTRICTIONS No  FALLS:  Has patient fallen in last 6 months? No, Number of falls:  She has had several near falls   LIVING ENVIRONMENT: A few steps in from the garage. 2 story house but doesn't need to go up often.   OCCUPATION:   Recreation: the patient would like to be able to walk her dog.   PLOF: Independent with basic ADLs  PATIENT GOALS   To improve balance and ability to walk her dog   OBJECTIVE:   LE MMT:   MMT Right 09/12/2021 Left 09/12/2021 Right  4/6   Hip flexion Initial 22.5   2/3 16.4 20   2/3 18.4 21.5 19.4  Hip extension  Hip abduction Initial 20.3   2/3 25.4 Initial 21.5     2/3 25.6 32.7 35  Hip adduction        Hip internal rotation        Hip external rotation        Knee flexion        Knee extension Initial 19.7   2/3  30.7   Initial 26     2/3 24.6 28.8 38.2  Ankle dorsiflexion        Ankle plantarflexion        Ankle inversion        Ankle eversion         (Blank rows = not tested)  BERG:  48    TODAY'S TREATMENT: 5/1  Air-ex SBA standing balance with narrow base eyes open 2x30 sec hold  Air-ex tandem stance 2x30 sec hold eyes closed   Step onto air-ex x10 fwb/back and side to side each leg   Supine quad set 2x10 each leg  SLR 2x10 each leg  Bridge 2x10  Clamshell 2x10  green    4/17 Hurdle walk 4 laps 4 hurdles Hurdle side step 4 laps   Hurdle in and out walk x4 laps  Sit to stand 3x5 with cuing and educaton on standing   Seated bilateral er 2x15 red  Seated flexion with horizontal abduction 2x15 red  Seated horizontal abduction 2x15   Seated nu-step 5 min  UE/LE    4/7 BERG balance testing performed and strength testing. Reviewed for HEP BERG testing: 2 baseline 48 now   Supine march 2x10 Supine bridge: spent time reviewing tehcnique with bridge. Patient having some difficulty on a soft bed at home. Patient able to do well on the plynth  Supine hip abdcution 2x15 green  Nu-step L3 2x10    3/20 Nu-step L3 5 min   Standing heel raise x15  Standing march 2x15  Standing hip extension x10  Standing march x10   Tandem stance eyes closed min a bilateral 2x20 sec hold   Air-ex SBA standing balance with narrow base eyes open 2x30 sec hold  Air-ex narrow base 2x30 sec hod eyes closed   Air-ex tandem eyes open and eyes closed min a eyes closed SBA eyes open 2x20 sec hold  Step over hurdle 2x10         PATIENT EDUCATION:  Education details: reviewed benefits of balance training; reviewed  Person educated: Patient Education method: Explanation, Demonstration, Tactile cues, Verbal cues, and Handouts Education comprehension: verbalized understanding, returned demonstration, verbal cues required, tactile cues required, and needs further education   HOME EXERCISE PROGRAM: Access Code: [email or web address] URL: [email or web address] Date: 06/18/2021 Prepared by: Carolyne Littles  Exercises Shoulder extension with resistance - Neutral - 1 x daily - 7 x weekly - 3 sets - 10 reps Scapular Retraction with Resistance - 1 x daily - 7 x weekly - 3 sets - 10 reps Shoulder External Rotation and Scapular Retraction with Resistance - 1 x daily - 7 x weekly - 3 sets - 10 reps   ASSESSMENT:  CLINICAL IMPRESSION: The patient tolerated  treatment well. She was given base exercises to help her knees if they get flaired up again. We also added stepping onto the air-ex. She is getting to the point where she has a full program of exercises. She feels like she is close to the point where she can start working on them on her own. She will come next visit then we will re-assess  her HEP and plan going forward. She may just work on them for a period of time then follow up.    cleaning, community activity, driving, meal prep, laundry, and yard work  Other factors: Anxiety, Tachycardia, depression,   REHAB POTENTIAL: Good  CLINICAL DECISION MAKING: Evolving/moderate complexity decreasing mobility over time   EVALUATION COMPLEXITY: Moderate   GOALS: Goals reviewed with patient? Yes All goals remain consistent   SHORT TERM GOALS:  STG Name Target Date Goal status  1 Patient will increase her BERG score by 6 points  Baseline:  12/30/2021 INITIAL  2 Patient will increase general hip strength by 5lbs bilateral  Baseline:  12/30/2021 INITIAL  3 Patient will be independent with basic HEP for balance and strengthening  Baseline: 12/30/2021 INITIAL  LONG TERM GOALS:   LTG Name Target Date Goal status  1 Patient will return to walking her dog without fear of falling  Baseline: 01/20/2022 INITIAL  2 Patient will ambulate with improved hip flexion in order to improve her ability to ambulate without tripping.  Baseline: 01/20/2022 INITIAL  3 Patient will be independent with complete HEP for balance and strengthening  Baseline: 01/20/2022 INITIAL   PLAN: PT FREQUENCY: 2x/week  PT DURATION: 6 weeks  PLANNED INTERVENTIONS: Therapeutic exercises, Therapeutic activity, Neuro Muscular re-education, Balance training, Gait training, Patient/Family education, Joint mobilization, Stair training, DME instructions, Aquatic Therapy, Cryotherapy, Moist heat, Taping, and Manual therapy  PLAN FOR NEXT SESSION: begin balance training consider tandem  stance and narrow base; consider hurdles; consider progression to air-ex; consider forward and backwards balance PHYSICAL THERAPY DISCHARGE SUMMARY  Visits from Start of Care: 7  Current functional level related to goals / functional outcomes: Full HEP; improved balance; improved endurance with daily tasks    Remaining deficits: Still limitations in all of the above but improving    Education / Equipment: HEP    Patient agrees to discharge. Patient goals were met. Patient is being discharged due to meeting the stated rehab goals.   Carney Living PT DPT  12/09/2021, 12:57 PM

## 2021-12-13 DIAGNOSIS — I872 Venous insufficiency (chronic) (peripheral): Secondary | ICD-10-CM | POA: Diagnosis not present

## 2021-12-13 DIAGNOSIS — Z950 Presence of cardiac pacemaker: Secondary | ICD-10-CM | POA: Diagnosis not present

## 2021-12-13 DIAGNOSIS — R350 Frequency of micturition: Secondary | ICD-10-CM | POA: Diagnosis not present

## 2021-12-13 DIAGNOSIS — I10 Essential (primary) hypertension: Secondary | ICD-10-CM | POA: Diagnosis not present

## 2021-12-13 DIAGNOSIS — I480 Paroxysmal atrial fibrillation: Secondary | ICD-10-CM | POA: Diagnosis not present

## 2021-12-16 ENCOUNTER — Encounter (HOSPITAL_BASED_OUTPATIENT_CLINIC_OR_DEPARTMENT_OTHER): Payer: Medicare Other | Admitting: Physical Therapy

## 2021-12-18 DIAGNOSIS — F325 Major depressive disorder, single episode, in full remission: Secondary | ICD-10-CM | POA: Diagnosis not present

## 2021-12-18 DIAGNOSIS — I480 Paroxysmal atrial fibrillation: Secondary | ICD-10-CM | POA: Diagnosis not present

## 2021-12-18 DIAGNOSIS — M810 Age-related osteoporosis without current pathological fracture: Secondary | ICD-10-CM | POA: Diagnosis not present

## 2021-12-18 DIAGNOSIS — E7800 Pure hypercholesterolemia, unspecified: Secondary | ICD-10-CM | POA: Diagnosis not present

## 2021-12-18 DIAGNOSIS — I10 Essential (primary) hypertension: Secondary | ICD-10-CM | POA: Diagnosis not present

## 2021-12-23 ENCOUNTER — Ambulatory Visit (HOSPITAL_BASED_OUTPATIENT_CLINIC_OR_DEPARTMENT_OTHER): Payer: Medicare Other | Admitting: Physical Therapy

## 2021-12-30 DIAGNOSIS — R300 Dysuria: Secondary | ICD-10-CM | POA: Diagnosis not present

## 2022-01-20 DIAGNOSIS — I10 Essential (primary) hypertension: Secondary | ICD-10-CM | POA: Diagnosis not present

## 2022-01-20 DIAGNOSIS — F325 Major depressive disorder, single episode, in full remission: Secondary | ICD-10-CM | POA: Diagnosis not present

## 2022-01-20 DIAGNOSIS — E7800 Pure hypercholesterolemia, unspecified: Secondary | ICD-10-CM | POA: Diagnosis not present

## 2022-01-20 DIAGNOSIS — I480 Paroxysmal atrial fibrillation: Secondary | ICD-10-CM | POA: Diagnosis not present

## 2022-01-20 DIAGNOSIS — M810 Age-related osteoporosis without current pathological fracture: Secondary | ICD-10-CM | POA: Diagnosis not present

## 2022-01-24 DIAGNOSIS — N390 Urinary tract infection, site not specified: Secondary | ICD-10-CM | POA: Diagnosis not present

## 2022-01-24 DIAGNOSIS — N952 Postmenopausal atrophic vaginitis: Secondary | ICD-10-CM | POA: Diagnosis not present

## 2022-01-24 DIAGNOSIS — R350 Frequency of micturition: Secondary | ICD-10-CM | POA: Diagnosis not present

## 2022-02-10 DIAGNOSIS — R300 Dysuria: Secondary | ICD-10-CM | POA: Diagnosis not present

## 2022-02-18 ENCOUNTER — Ambulatory Visit (INDEPENDENT_AMBULATORY_CARE_PROVIDER_SITE_OTHER): Payer: Medicare Other

## 2022-02-18 DIAGNOSIS — I495 Sick sinus syndrome: Secondary | ICD-10-CM | POA: Diagnosis not present

## 2022-02-18 LAB — CUP PACEART REMOTE DEVICE CHECK
Battery Remaining Longevity: 111 mo
Battery Voltage: 2.99 V
Brady Statistic AP VP Percent: 0.29 %
Brady Statistic AP VS Percent: 59.33 %
Brady Statistic AS VP Percent: 0.01 %
Brady Statistic AS VS Percent: 40.37 %
Brady Statistic RA Percent Paced: 61.34 %
Brady Statistic RV Percent Paced: 0.3 %
Date Time Interrogation Session: 20230711001912
Implantable Lead Implant Date: 20180709
Implantable Lead Implant Date: 20180709
Implantable Lead Location: 753859
Implantable Lead Location: 753860
Implantable Lead Model: 3830
Implantable Lead Model: 5076
Implantable Pulse Generator Implant Date: 20180709
Lead Channel Impedance Value: 285 Ohm
Lead Channel Impedance Value: 323 Ohm
Lead Channel Impedance Value: 399 Ohm
Lead Channel Impedance Value: 437 Ohm
Lead Channel Pacing Threshold Amplitude: 0.625 V
Lead Channel Pacing Threshold Pulse Width: 0.4 ms
Lead Channel Sensing Intrinsic Amplitude: 2 mV
Lead Channel Sensing Intrinsic Amplitude: 2 mV
Lead Channel Sensing Intrinsic Amplitude: 7.25 mV
Lead Channel Sensing Intrinsic Amplitude: 7.25 mV
Lead Channel Setting Pacing Amplitude: 1.5 V
Lead Channel Setting Pacing Amplitude: 2.5 V
Lead Channel Setting Pacing Pulse Width: 1 ms
Lead Channel Setting Sensing Sensitivity: 2 mV

## 2022-02-26 ENCOUNTER — Emergency Department (HOSPITAL_BASED_OUTPATIENT_CLINIC_OR_DEPARTMENT_OTHER): Payer: Medicare Other | Admitting: Radiology

## 2022-02-26 ENCOUNTER — Emergency Department (HOSPITAL_BASED_OUTPATIENT_CLINIC_OR_DEPARTMENT_OTHER): Payer: Medicare Other

## 2022-02-26 ENCOUNTER — Emergency Department (HOSPITAL_BASED_OUTPATIENT_CLINIC_OR_DEPARTMENT_OTHER)
Admission: EM | Admit: 2022-02-26 | Discharge: 2022-02-26 | Disposition: A | Discharge status: Home / Self Care | Payer: Medicare Other | Attending: Emergency Medicine | Admitting: Emergency Medicine

## 2022-02-26 ENCOUNTER — Other Ambulatory Visit: Payer: Self-pay

## 2022-02-26 DIAGNOSIS — R079 Chest pain, unspecified: Secondary | ICD-10-CM | POA: Diagnosis not present

## 2022-02-26 DIAGNOSIS — Z7982 Long term (current) use of aspirin: Secondary | ICD-10-CM | POA: Diagnosis not present

## 2022-02-26 DIAGNOSIS — R109 Unspecified abdominal pain: Secondary | ICD-10-CM | POA: Diagnosis not present

## 2022-02-26 DIAGNOSIS — R0789 Other chest pain: Secondary | ICD-10-CM | POA: Insufficient documentation

## 2022-02-26 DIAGNOSIS — Z950 Presence of cardiac pacemaker: Secondary | ICD-10-CM | POA: Diagnosis not present

## 2022-02-26 LAB — HEPATIC FUNCTION PANEL
ALT: 13 U/L (ref 0–44)
AST: 22 U/L (ref 15–41)
Albumin: 4.4 g/dL (ref 3.5–5.0)
Alkaline Phosphatase: 58 U/L (ref 38–126)
Bilirubin, Direct: 0.2 mg/dL (ref 0.0–0.2)
Indirect Bilirubin: 0.8 mg/dL (ref 0.3–0.9)
Total Bilirubin: 1 mg/dL (ref 0.3–1.2)
Total Protein: 7.3 g/dL (ref 6.5–8.1)

## 2022-02-26 LAB — URINALYSIS, ROUTINE W REFLEX MICROSCOPIC
Bilirubin Urine: NEGATIVE
Glucose, UA: NEGATIVE mg/dL
Hgb urine dipstick: NEGATIVE
Ketones, ur: NEGATIVE mg/dL
Leukocytes,Ua: NEGATIVE
Nitrite: NEGATIVE
Protein, ur: NEGATIVE mg/dL
Specific Gravity, Urine: <1.005 — ABNORMAL LOW (ref 1.005–1.030)
pH: 7 (ref 5.0–8.0)

## 2022-02-26 LAB — CBC
HCT: 47.5 % — ABNORMAL HIGH (ref 36.0–46.0)
Hemoglobin: 15.6 g/dL — ABNORMAL HIGH (ref 12.0–15.0)
MCH: 30.5 pg (ref 26.0–34.0)
MCHC: 32.8 g/dL (ref 30.0–36.0)
MCV: 92.8 fL (ref 80.0–100.0)
Platelets: 256 10*3/uL (ref 150–400)
RBC: 5.12 MIL/uL — ABNORMAL HIGH (ref 3.87–5.11)
RDW: 13.2 % (ref 11.5–15.5)
WBC: 5.8 10*3/uL (ref 4.0–10.5)
nRBC: 0 % (ref 0.0–0.2)

## 2022-02-26 LAB — TROPONIN I (HIGH SENSITIVITY)
Troponin I (High Sensitivity): 5 ng/L (ref <18)
Troponin I (High Sensitivity): 5 ng/L (ref <18)

## 2022-02-26 LAB — BASIC METABOLIC PANEL
Anion gap: 8 (ref 5–15)
BUN: 13 mg/dL (ref 8–23)
CO2: 27 mmol/L (ref 22–32)
Calcium: 9.8 mg/dL (ref 8.9–10.3)
Chloride: 103 mmol/L (ref 98–111)
Creatinine, Ser: 0.78 mg/dL (ref 0.44–1.00)
GFR, Estimated: >60 mL/min (ref >60)
Glucose, Bld: 119 mg/dL — ABNORMAL HIGH (ref 70–99)
Potassium: 4 mmol/L (ref 3.5–5.1)
Sodium: 138 mmol/L (ref 135–145)

## 2022-02-26 LAB — LIPASE, BLOOD: Lipase: 34 U/L (ref 11–51)

## 2022-02-26 MED ORDER — FAMOTIDINE 20 MG PO TABS: 20.0000 mg | ORAL_TABLET | Freq: Once | ORAL | Status: DC

## 2022-02-26 MED ORDER — LIDOCAINE VISCOUS HCL 2 % MT SOLN: 15.0000 mL | Freq: Once | OROMUCOSAL | Status: DC

## 2022-02-26 MED ORDER — ALUM & MAG HYDROXIDE-SIMETH 200-200-20 MG/5ML PO SUSP: 30.0000 mL | Freq: Once | ORAL | Status: DC

## 2022-02-26 NOTE — ED Notes (Signed)
Discharge instructions reviewed and explained, pt verbalized understanding and had no further questions on d/c. Pt caox4 and ambulatory on departure.

## 2022-02-26 NOTE — ED Triage Notes (Signed)
Pt presents POV from home with mid-sternum chest heaviness started 0300, non-radiating, , associated with abd discomfort, indigestion and some nausea.  Pt also reports some Left arm pain but unsure if she had arm pain last night during this event.  Pt describes her chest pain as someone sitting on her chest, originally thought it was indigestion, then tried deep breathing.

## 2022-02-26 NOTE — ED Provider Notes (Signed)
[facility] EMERGENCY DEPT Provider Note   CSN: [id number] Arrival date & time: 02/26/22  3570     History  CC: Chest pain   Katherine Cardenas is a 81 y.o. female w/ hx of PAF, sinus node dysfunction, sinus bradycardia s/p pacemaker placement, presenting to the ED with chest pain.  She reports epigastric discomfort that woke her from sleep around 0300 last night, and then has been waxing and waning through the course of the night into this morning.  She said it feels like indigestion but also like a knot or heaviness in her chest.  She does suffer from indigestion but this felt different to her.  She says she is currently asymptomatic.  She reports he had dinner around 7 PM last night, eats a fairly small dinner but does take prunes before bedtime for chronic constipation.  She denies currently any nausea, vomiting, or any issues with diarrhea or constipation.  She reports her only abdominal surgery was an appendectomy.  She otherwise feels well at the moment, but presented because she felt anxious.  She reports "I live by myself, and I think some reassurance is helpful."    She does suffer from recurring UTI's, has reported 4 UTI's this year.  She is not on antibiotics.  External record review shows the patient's last cardiology evaluation was in May 2022, with a cardiologist notes that they opted not to anticoagulate given the brevity of the atrial fibrillation episodes that she experienced.  Her last cardiac catheterization was in 2016 which demonstrated mild nonobstructive coronary disease (20% stenosis of both the prox RCA & Ost LAD to Prox LAD).  External record review also shows that her pacemaker review 1 week ago, on 02/18/22 was functionally normally, and review prior to that in April showed that she had 2 nonsustained V. tach episodes, and PAT/SVT with rates to 150's, with the longest lasting less than a minute.  HPI     Home Medications Prior to Admission medications    Medication Sig Start Date End Date Taking? Authorizing Provider  amLODipine (NORVASC) 2.5 MG tablet Take 1 tablet (2.5 mg total) by mouth daily. 06/18/21   Shirley Friar, PA-C  Ascorbic Acid (VITAMIN C) 1000 MG tablet Take 500 mg by mouth daily before supper.     [provider]  aspirin EC 81 MG tablet Take 81 mg by mouth daily.    [provider]  cholecalciferol (VITAMIN D) 1000 UNITS tablet Take 1,000 Units by mouth at bedtime.     [provider]  Coenzyme Q10 200 MG capsule Take 200 mg by mouth daily.    [provider]  COVID-19 mRNA bivalent vaccine, Moderna, (MODERNA COVID-19 BIVAL BOOSTER) 50 MCG/0.5ML injection Inject into the muscle. 07/22/21   Carlyle Basques, MD  COVID-19 mRNA Vac-TriS, Pfizer, (PFIZER-BIONT COVID-19 VAC-TRIS) SUSP injection Inject into the muscle. 02/04/21   Carlyle Basques, MD  Cranberry 500 MG TABS Take 500 mg by mouth daily.    [provider]  diltiazem (CARDIZEM) 30 MG tablet TAKE 1 TABLET DAILY AS NEEDED. FOR HEART RATE>100. 10/17/20   Deboraha Sprang, MD  Diphenhyd-Calamine-Benzyl Alc (IVAREST POISON IVY [facility] EX) Apply 1 application topically daily as needed (itching).    [provider]  doxycycline (VIBRAMYCIN) 100 MG capsule Take 1 capsule (100 mg total) by mouth 2 (two) times daily. 09/13/21   Wyvonnia Dusky, MD  LORazepam (ATIVAN) 0.5 MG tablet Take 0.25 mg by mouth 2 (two) times daily.  [provider]  Magnesium 400 MG CAPS Take 400 mg by mouth daily.    [provider]  metoprolol tartrate (LOPRESSOR) 25 MG tablet Take 0.5 tablets (12.5 mg total) by mouth 2 (two) times daily. 06/18/21 09/16/21  Shirley Friar, PA-C  nitroGLYCERIN (NITROSTAT) 0.4 MG SL tablet Place 1 tablet (0.4 mg total) under the tongue every 5 (five) minutes x 3 doses as needed for chest pain. 05/25/18   Deboraha Sprang, MD  Polyethyl Glycol-Propyl Glycol (SYSTANE OP) Place 1 drop into both eyes 2  (two) times daily.    [provider]  Probiotic Product (PROBIOTIC COMPLEX ACIDOPHILUS) CAPS Take 1 tablet by mouth daily. "Enzyme Probiotic Complex"    [provider]  propafenone (RYTHMOL) 150 MG tablet TAKE 1 TABLET BY MOUTH EVERY 12 HOURS. 12/02/21   Deboraha Sprang, MD  Turmeric 500 MG CAPS Take 500 mg by mouth at bedtime.    [provider]  vitamin B-12 (CYANOCOBALAMIN) 1000 MCG tablet Take 1,000 mcg by mouth every other day.    [provider]      Allergies    Augmentin [amoxicillin-pot clavulanate], Ciprofloxacin, Diltiazem, Epinephrine, Novocain [procaine], Avapro [irbesartan], Shrimp [shellfish allergy], Iohexol, Macrobid [nitrofurantoin], Penicillins, and Zithromax [azithromycin]    Review of Systems   Review of Systems  Physical Exam Updated Vital Signs BP (!) 157/90   Pulse (!) 110   Temp 98.1 F (36.7 C) (Oral)   Resp 16   LMP 08/11/1988 (Approximate)   SpO2 97%  Physical Exam Constitutional:      General: She is not in acute distress. HENT:     Head: Normocephalic and atraumatic.  Eyes:     Conjunctiva/sclera: Conjunctivae normal.     Pupils: Pupils are equal, round, and reactive to light.  Cardiovascular:     Rate and Rhythm: Normal rate and regular rhythm.  Pulmonary:     Effort: Pulmonary effort is normal. No respiratory distress.  Abdominal:     General: There is no distension.     Tenderness: There is no abdominal tenderness.  Skin:    General: Skin is warm and dry.  Neurological:     General: No focal deficit present.     Mental Status: She is alert. Mental status is at baseline.  Psychiatric:        Mood and Affect: Mood normal.        Behavior: Behavior normal.     ED Results / Procedures / Treatments   Labs (all labs ordered are listed, but only abnormal results are displayed) Labs Reviewed  BASIC METABOLIC PANEL - Abnormal; Notable for the following components:      Result Value   Glucose, Bld 119  (*)    All other components within normal limits  CBC - Abnormal; Notable for the following components:   RBC 5.12 (*)    Hemoglobin 15.6 (*)    HCT 47.5 (*)    All other components within normal limits  URINALYSIS, ROUTINE W REFLEX MICROSCOPIC - Abnormal; Notable for the following components:   Color, Urine COLORLESS (*)    Specific Gravity, Urine <1.005 (*)    All other components within normal limits  HEPATIC FUNCTION PANEL  LIPASE, BLOOD  TROPONIN I (HIGH SENSITIVITY)  TROPONIN I (HIGH SENSITIVITY)    EKG EKG Interpretation  Date/Time:  Wednesday February 26 2022 10:08:44 EDT Ventricular Rate:  68 PR Interval:  207 QRS Duration: 110 QT Interval:  419 QTC Calculation: 446 R Axis:   -  19 Text Interpretation: Atrial-paced rhythm Borderline left axis deviation No significant change since last tracing Confirmed by Octaviano Glow [phone]) on 02/26/2022 11:18:55 AM  Radiology US Abdomen Limited RUQ (LIVER/GB)  Result Date: 02/26/2022 CLINICAL DATA:  Epigastric and chest pain EXAM: ULTRASOUND ABDOMEN LIMITED RIGHT UPPER QUADRANT COMPARISON:  08/13/2018 FINDINGS: Gallbladder: No gallstones or wall thickening visualized. No sonographic Murphy sign noted by sonographer. Common bile duct: Diameter: 3 mm Liver: No focal lesion identified. Somewhat coarsened echotexture and heterogeneous parenchymal echogenicity. Portal vein is patent on color Doppler imaging with normal direction of blood flow towards the liver. Other: None. IMPRESSION: No evidence of cholelithiasis or cholecystitis. Electronically Signed   By: Merilyn Baba M.D.   On: 02/26/2022 11:49   DG Chest 1 View  Result Date: 02/26/2022 CLINICAL DATA:  Chest pain evaluate for pneumonia or effusion EXAM: CHEST  1 VIEW COMPARISON:  Same day AP chest radiograph, chest radiograph 08/13/2018 FINDINGS: A single lateral projection of the chest is provided. There is no focal consolidation or pleural effusion. A left chest wall cardiac device and  leads are again noted. There is no acute osseous abnormality. IMPRESSION: No focal consolidation or pleural effusion identified on this single lateral projection. Electronically Signed   By: Valetta Mole M.D.   On: 02/26/2022 09:28   DG Chest Portable 1 View  Result Date: 02/26/2022 CLINICAL DATA:  Chest pain. EXAM: PORTABLE CHEST 1 VIEW COMPARISON:  08/13/2018 FINDINGS: There is a left chest wall pacer device leads in the right atrium and right ventricle. Heart size appears normal. There is no pleural effusion or edema. No airspace opacities. IMPRESSION: No acute cardiopulmonary abnormalities. Electronically Signed   By: Kerby Moors M.D.   On: 02/26/2022 08:27    Procedures Procedures    Medications Ordered in ED Medications - No data to display  ED Course/ Medical Decision Making/ A&P Clinical Course as of 02/26/22 1312  Wed Feb 26, 2022  1116 Delta troponins are negative.  Patient's labs are unremarkable.  She did report some very mild return of her epigastric discomfort about an hour ago, and I had discussed with her trying GI medications, but she did not want his medications.  Repeat EKG at that time was unchanged, no acute ischemic findings, with continued atrial paced rhythm.  She is pending a gallbladder ultrasound.  If this is unremarkable I would anticipate discharge home.  I recommended to her that she does touch base with her cardiologist, and explained that there are further tests that cannot be done out of the emergency department, but at this time, with minimal to no symptoms, negative troponins, normal repeat EKGs, and no significant coronary obstructive history, including on LHC in 2016, I feel that she is reasonably low risk for outpatient follow-up. [MT]    Clinical Course User Index [MT] Jessa Stinson, Carola Rhine, MD                           Medical Decision Making Amount and/or Complexity of Data Reviewed Labs: ordered. Radiology: ordered.   This patient presents to the  Emergency Department with complaint of chest pain. This involves an extensive number of treatment options, and is a complaint that carries with it a high risk of complications and morbidity, given the patient's comorbidity, including HTN, HLD, as cardiac risk factors .The differential diagnosis includes ACS vs Pneumothorax vs Reflux/Gastritis vs MSK pain vs Pneumonia vs biliary disease vs other  Nighttime symptom onset that  is epigastric would be more consistent with reflux/gastritis.  However she is asymptomatic now; holding GI medications.  I felt PE was less likely given that her symptoms been waxing and waning, she is not hypoxic, not tachycardic.  She is currently asymptomatic.  This presentation would not be consistent with a PE.  Likewise this presentation would be highly consistent with an aortic dissection.  I do not believe that she emergently needs a CT angiogram of the chest.  I ordered, reviewed, and interpreted labs.  Pertinent results include no significant findings She currently has no active chest pain or symptoms, no medications indicated at this time. I ordered imaging studies which included x-ray of the chest, right upper quadrant ultrasound I independently visualized and interpreted imaging which showed no significant finding and the monitor tracing which showed paced rhythm. I agree with the radiologist interpretation External records obtained and reviewed showing cardiology evaluation and workup as noted in history above I personally reviewed the patients ECG which showed atrial paced rhythm with no acute ischemic findings.  Repeat EKG is stable and unchanged  After the interventions stated above, I reevaluated the patient and found that they were minimally symptomatic, feeling well, hungry and wanting to go home.  Based on the patient's clinical exam, vital signs, risk factors, and ED testing, I felt that the patient's overall risk of life-threatening emergency such as ACS, PE,  sepsis, or infection was low.  At this time, I felt the patient's presentation was most clinically consistent with nonspecific chest pain, but explained to the patient that this evaluation was not a definitive diagnostic workup.  I discussed outpatient follow up with primary care provider, and provided specialist office number on the patient's discharge paper if a referral was deemed necessary.  Return precautions were discussed with the patient.  I felt the patient was clinically stable for discharge.         Final Clinical Impression(s) / ED Diagnoses Final diagnoses:  Chest discomfort    Rx / DC Orders ED Discharge Orders     None         Wyvonnia Dusky, MD 02/26/22 1312

## 2022-02-26 NOTE — Discharge Instructions (Addendum)
You were diagnosed with chest pain today.  This is a non-specific diagnosis, but your provider did not feel this was a life-threatening condition at this time.  Chest pain is a common presenting condition in the Emergency Department, and it is not uncommon for patients to leave without a specific diagnosis.  It is important to remember that your workup today was not a complete medical workup.  You may still have a serious medical attention that needs follow up care with a specialist. If your provider referred you to see a specialist, it is VERY important that you call to set up an appointment with them.    It is also important that you speak to your primary care provider in 1-2 days after this visit to the ER.  Your PCP may want to see you in the office, or else they may want you to follow up with the specialist.  SEEK IMMEDIATE MEDICAL ATTENTION IF:  You have severe chest pain, especially if the pain is crushing or pressure-like and spreads to the arms, back, neck, or jaw, or if you have sweating, nausea (feeling sick to your stomach), or shortness of breath. THIS IS AN EMERGENCY. Don't wait to see if the pain will go away. Get medical help at once. Call 911 or 0 (operator). DO NOT drive yourself to the hospital.   Your chest pain gets worse and does not go away with rest.  You have an attack of chest pain lasting longer than usual, despite rest and treatment with the medications your caregiver has prescribed.  You wake from sleep with chest pain or shortness of breath.  You feel dizzy or faint.  You have chest pain not typical of your usual pain for which you originally saw your caregiver.  

## 2022-02-28 ENCOUNTER — Other Ambulatory Visit: Payer: Self-pay | Admitting: Internal Medicine

## 2022-03-13 NOTE — Progress Notes (Signed)
Remote pacemaker transmission.   

## 2022-03-27 DIAGNOSIS — E7800 Pure hypercholesterolemia, unspecified: Secondary | ICD-10-CM | POA: Diagnosis not present

## 2022-03-27 DIAGNOSIS — I480 Paroxysmal atrial fibrillation: Secondary | ICD-10-CM | POA: Diagnosis not present

## 2022-03-27 DIAGNOSIS — I10 Essential (primary) hypertension: Secondary | ICD-10-CM | POA: Diagnosis not present

## 2022-03-27 DIAGNOSIS — F325 Major depressive disorder, single episode, in full remission: Secondary | ICD-10-CM | POA: Diagnosis not present

## 2022-03-27 DIAGNOSIS — M810 Age-related osteoporosis without current pathological fracture: Secondary | ICD-10-CM | POA: Diagnosis not present

## 2022-03-28 DIAGNOSIS — I480 Paroxysmal atrial fibrillation: Secondary | ICD-10-CM | POA: Diagnosis not present

## 2022-03-28 DIAGNOSIS — N390 Urinary tract infection, site not specified: Secondary | ICD-10-CM | POA: Diagnosis not present

## 2022-03-28 DIAGNOSIS — Z950 Presence of cardiac pacemaker: Secondary | ICD-10-CM | POA: Diagnosis not present

## 2022-03-28 DIAGNOSIS — I10 Essential (primary) hypertension: Secondary | ICD-10-CM | POA: Diagnosis not present

## 2022-03-28 DIAGNOSIS — F411 Generalized anxiety disorder: Secondary | ICD-10-CM | POA: Diagnosis not present

## 2022-04-29 DIAGNOSIS — R300 Dysuria: Secondary | ICD-10-CM | POA: Diagnosis not present

## 2022-04-29 DIAGNOSIS — F411 Generalized anxiety disorder: Secondary | ICD-10-CM | POA: Diagnosis not present

## 2022-05-05 DIAGNOSIS — F325 Major depressive disorder, single episode, in full remission: Secondary | ICD-10-CM | POA: Diagnosis not present

## 2022-05-05 DIAGNOSIS — M810 Age-related osteoporosis without current pathological fracture: Secondary | ICD-10-CM | POA: Diagnosis not present

## 2022-05-05 DIAGNOSIS — E7800 Pure hypercholesterolemia, unspecified: Secondary | ICD-10-CM | POA: Diagnosis not present

## 2022-05-05 DIAGNOSIS — I10 Essential (primary) hypertension: Secondary | ICD-10-CM | POA: Diagnosis not present

## 2022-05-05 DIAGNOSIS — I480 Paroxysmal atrial fibrillation: Secondary | ICD-10-CM | POA: Diagnosis not present

## 2022-05-13 DIAGNOSIS — F325 Major depressive disorder, single episode, in full remission: Secondary | ICD-10-CM | POA: Diagnosis not present

## 2022-05-13 DIAGNOSIS — R269 Unspecified abnormalities of gait and mobility: Secondary | ICD-10-CM | POA: Diagnosis not present

## 2022-05-13 DIAGNOSIS — F411 Generalized anxiety disorder: Secondary | ICD-10-CM | POA: Diagnosis not present

## 2022-05-20 ENCOUNTER — Ambulatory Visit (INDEPENDENT_AMBULATORY_CARE_PROVIDER_SITE_OTHER): Payer: Medicare Other

## 2022-05-20 DIAGNOSIS — I495 Sick sinus syndrome: Secondary | ICD-10-CM

## 2022-05-20 LAB — CUP PACEART REMOTE DEVICE CHECK
Battery Remaining Longevity: 106 mo
Battery Voltage: 2.99 V
Brady Statistic AP VP Percent: 0.33 %
Brady Statistic AP VS Percent: 65.55 %
Brady Statistic AS VP Percent: 0.02 %
Brady Statistic AS VS Percent: 34.11 %
Brady Statistic RA Percent Paced: 68.7 %
Brady Statistic RV Percent Paced: 0.34 %
Date Time Interrogation Session: 20231010041201
Implantable Lead Implant Date: 20180709
Implantable Lead Implant Date: 20180709
Implantable Lead Location: 753859
Implantable Lead Location: 753860
Implantable Lead Model: 3830
Implantable Lead Model: 5076
Implantable Pulse Generator Implant Date: 20180709
Lead Channel Impedance Value: 266 Ohm
Lead Channel Impedance Value: 304 Ohm
Lead Channel Impedance Value: 380 Ohm
Lead Channel Impedance Value: 437 Ohm
Lead Channel Pacing Threshold Amplitude: 0.625 V
Lead Channel Pacing Threshold Pulse Width: 0.4 ms
Lead Channel Sensing Intrinsic Amplitude: 2 mV
Lead Channel Sensing Intrinsic Amplitude: 2 mV
Lead Channel Sensing Intrinsic Amplitude: 9.625 mV
Lead Channel Sensing Intrinsic Amplitude: 9.625 mV
Lead Channel Setting Pacing Amplitude: 1.5 V
Lead Channel Setting Pacing Amplitude: 2.5 V
Lead Channel Setting Pacing Pulse Width: 1 ms
Lead Channel Setting Sensing Sensitivity: 2 mV

## 2022-05-30 ENCOUNTER — Other Ambulatory Visit: Payer: Self-pay | Admitting: Student

## 2022-05-30 DIAGNOSIS — I495 Sick sinus syndrome: Secondary | ICD-10-CM

## 2022-06-04 NOTE — Progress Notes (Signed)
Remote pacemaker transmission.   

## 2022-06-10 DIAGNOSIS — F330 Major depressive disorder, recurrent, mild: Secondary | ICD-10-CM | POA: Diagnosis not present

## 2022-06-11 DIAGNOSIS — B356 Tinea cruris: Secondary | ICD-10-CM | POA: Diagnosis not present

## 2022-06-21 ENCOUNTER — Emergency Department (HOSPITAL_BASED_OUTPATIENT_CLINIC_OR_DEPARTMENT_OTHER)
Admission: EM | Admit: 2022-06-21 | Discharge: 2022-06-21 | Disposition: A | Discharge status: Home / Self Care | Payer: Medicare Other | Attending: Emergency Medicine | Admitting: Emergency Medicine

## 2022-06-21 ENCOUNTER — Encounter (HOSPITAL_BASED_OUTPATIENT_CLINIC_OR_DEPARTMENT_OTHER): Payer: Self-pay | Admitting: Emergency Medicine

## 2022-06-21 ENCOUNTER — Other Ambulatory Visit: Payer: Self-pay

## 2022-06-21 DIAGNOSIS — Z7982 Long term (current) use of aspirin: Secondary | ICD-10-CM | POA: Insufficient documentation

## 2022-06-21 DIAGNOSIS — R21 Rash and other nonspecific skin eruption: Secondary | ICD-10-CM | POA: Diagnosis not present

## 2022-06-21 MED ORDER — KETOCONAZOLE 2 % EX CREA: 1.0000 | TOPICAL_CREAM | Freq: Every day | CUTANEOUS | 1 refills | Status: DC

## 2022-06-21 MED ORDER — KETOCONAZOLE 2 % EX CREA: TOPICAL_CREAM | Freq: Once | CUTANEOUS | Status: DC

## 2022-06-21 NOTE — ED Provider Notes (Signed)
[facility] EMERGENCY DEPT Provider Note   CSN: [id number] Arrival date & time: 06/21/22  1233     History  Chief Complaint  Patient presents with   Rash   HPI Katherine Cardenas is a 81 y.o. female with atrial fibrillation, anxiety and depression presenting for rash.  She noticed the rash 10 days ago.  Rash is primarily located in her rectal crease.  She was evaluated by her PCP 7 days ago and diagnosed with a fungal infection. Started on ketoconazole which she has been taking twice a day.  Rash is red, itchy and at times painful.  She has taken the ketoconazole 7 days ago. 2 days ago she thought the rash was proving but    Rash      Home Medications Prior to Admission medications   Medication Sig Start Date End Date Taking? Authorizing Provider  amLODipine (NORVASC) 2.5 MG tablet Take 1 tablet (2.5 mg total) by mouth daily. Please contact our office to schedule an appointment for future refills. [phone]. 1st attempt 05/30/22   Deboraha Sprang, MD  Ascorbic Acid (VITAMIN C) 1000 MG tablet Take 500 mg by mouth daily before supper.     [provider]  aspirin EC 81 MG tablet Take 81 mg by mouth daily.    [provider]  cholecalciferol (VITAMIN D) 1000 UNITS tablet Take 1,000 Units by mouth at bedtime.     [provider]  Coenzyme Q10 200 MG capsule Take 200 mg by mouth daily.    [provider]  COVID-19 mRNA bivalent vaccine, Moderna, (MODERNA COVID-19 BIVAL BOOSTER) 50 MCG/0.5ML injection Inject into the muscle. 07/22/21   Carlyle Basques, MD  COVID-19 mRNA Vac-TriS, Pfizer, (PFIZER-BIONT COVID-19 VAC-TRIS) SUSP injection Inject into the muscle. 02/04/21   Carlyle Basques, MD  Cranberry 500 MG TABS Take 500 mg by mouth daily.    [provider]  diltiazem (CARDIZEM) 30 MG tablet TAKE 1 TABLET DAILY AS NEEDED. FOR HEART RATE>100. 10/17/20   Deboraha Sprang, MD  Diphenhyd-Calamine-Benzyl Alc (IVAREST POISON IVY [facility] EX)  Apply 1 application topically daily as needed (itching).    [provider]  doxycycline (VIBRAMYCIN) 100 MG capsule Take 1 capsule (100 mg total) by mouth 2 (two) times daily. 09/13/21   Wyvonnia Dusky, MD  LORazepam (ATIVAN) 0.5 MG tablet Take 0.25 mg by mouth 2 (two) times daily.    [provider]  Magnesium 400 MG CAPS Take 400 mg by mouth daily.    [provider]  metoprolol tartrate (LOPRESSOR) 25 MG tablet Take 0.5 tablets (12.5 mg total) by mouth 2 (two) times daily. 06/18/21 09/16/21  Shirley Friar, PA-C  nitroGLYCERIN (NITROSTAT) 0.4 MG SL tablet Place 1 tablet (0.4 mg total) under the tongue every 5 (five) minutes x 3 doses as needed for chest pain. 05/25/18   Deboraha Sprang, MD  Polyethyl Glycol-Propyl Glycol (SYSTANE OP) Place 1 drop into both eyes 2 (two) times daily.    [provider]  Probiotic Product (PROBIOTIC COMPLEX ACIDOPHILUS) CAPS Take 1 tablet by mouth daily. "Enzyme Probiotic Complex"    [provider]  propafenone (RYTHMOL) 150 MG tablet TAKE 1 TABLET BY MOUTH EVERY 12 HOURS 02/28/22   Deboraha Sprang, MD  Turmeric 500 MG CAPS Take 500 mg by mouth at bedtime.    [provider]  vitamin B-12 (CYANOCOBALAMIN) 1000 MCG tablet Take 1,000 mcg by mouth every other day.    [provider]  Allergies    Augmentin [amoxicillin-pot clavulanate], Ciprofloxacin, Diltiazem, Epinephrine, Novocain [procaine], Avapro [irbesartan], Shrimp [shellfish allergy], Iohexol, Macrobid [nitrofurantoin], Penicillins, and Zithromax [azithromycin]    Review of Systems   Review of Systems  Skin:  Positive for rash.    Physical Exam Updated Vital Signs BP (!) 152/95 (BP Location: Right Arm)   Pulse 80   Temp 98.5 F (36.9 C) (Oral)   Resp 18   Ht '5\' 6"'$  (1.676 m)   Wt 59 kg   LMP 08/11/1988 (Approximate)   SpO2 96%   BMI 20.98 kg/m  Physical Exam Vitals reviewed.  Constitutional:      Appearance: Normal  appearance.  HENT:     Head: Normocephalic.     Nose: Nose normal.  Eyes:     Conjunctiva/sclera: Conjunctivae normal.  Pulmonary:     Effort: Pulmonary effort is normal.  Genitourinary:    Comments: Rash noted about the gluteal cleft extending down into between the rectal folds.  Rash is red, irregular border with satellite lesions.  Approximately 4 inches in diameter. Neurological:     Mental Status: She is alert.  Psychiatric:        Mood and Affect: Mood normal.     ED Results / Procedures / Treatments   Labs (all labs ordered are listed, but only abnormal results are displayed) Labs Reviewed - No data to display  EKG None  Radiology No results found.  Procedures Procedures    Medications Ordered in ED Medications  ketoconazole (NIZORAL) 2 % cream (has no administration in time range)    ED Course/ Medical Decision Making/ A&P                           Medical Decision Making  Patient presented for rash.  On exam, rash was located at the gluteal cleft extending between the gluteal folds down to the rectum.  Rash is also red, with irregular border and satellite lesions.  Patient stated that the rash is itchy, painful and burning.  Findings on exam and history, rashes concerning for an ongoing fungal infection.  Patient stated that overall she thinks the ketoconazole is working but needs a refill.  Also discussed oral antifungals.  But patient stated that she would rather continue the topical treatment for a couple weeks and follow-up with her PCP.  I refilled her ketoconazole and advised her to follow-up with her PCP for reevaluation in a week.        Final Clinical Impression(s) / ED Diagnoses Final diagnoses:  Rash    Rx / DC Orders ED Discharge Orders     None         Harriet Pho, PA-C 06/21/22 [place], MD 06/22/22 [phone]

## 2022-06-21 NOTE — Discharge Instructions (Signed)
Evaluation of your rash reveals that you likely have a ongoing candidal rash about the gluteal cleft.  This is a fungal infection.  Recommend that you continue to take ketoconazole and apply twice a day.  Commend that you take it for another 2 weeks and follow-up with your PCP.

## 2022-06-21 NOTE — ED Triage Notes (Signed)
Pt via pov from home with rash in her rectal/buttock area. She saw pcp and was given ketoconazole to use and was told it would be gone in a week. She has been using cream but states it got better but then got itchy and painful again. Pt alert & oriented, nad noted.

## 2022-06-23 DIAGNOSIS — I480 Paroxysmal atrial fibrillation: Secondary | ICD-10-CM | POA: Diagnosis not present

## 2022-06-23 DIAGNOSIS — I10 Essential (primary) hypertension: Secondary | ICD-10-CM | POA: Diagnosis not present

## 2022-06-23 DIAGNOSIS — E7800 Pure hypercholesterolemia, unspecified: Secondary | ICD-10-CM | POA: Diagnosis not present

## 2022-06-23 DIAGNOSIS — M810 Age-related osteoporosis without current pathological fracture: Secondary | ICD-10-CM | POA: Diagnosis not present

## 2022-06-23 DIAGNOSIS — F325 Major depressive disorder, single episode, in full remission: Secondary | ICD-10-CM | POA: Diagnosis not present

## 2022-06-24 ENCOUNTER — Encounter (HOSPITAL_BASED_OUTPATIENT_CLINIC_OR_DEPARTMENT_OTHER): Payer: Self-pay | Admitting: Physical Therapy

## 2022-06-24 ENCOUNTER — Ambulatory Visit (HOSPITAL_BASED_OUTPATIENT_CLINIC_OR_DEPARTMENT_OTHER): Payer: Medicare Other | Attending: Internal Medicine | Admitting: Physical Therapy

## 2022-06-24 DIAGNOSIS — R2689 Other abnormalities of gait and mobility: Secondary | ICD-10-CM | POA: Insufficient documentation

## 2022-06-24 NOTE — Therapy (Signed)
OUTPATIENT PHYSICAL THERAPY LOWER EXTREMITY EVALUATION   Patient Name: Katherine Cardenas MRN: [id number] DOB:08/16/1940, 81 y.o., female Today's Date: 06/24/2022    Past Medical History:  Diagnosis Date   Anxiety    Atrial fibrillation [facility])    Atrial tachycardia    Depression    Facial spasm    Hypertension    Tachy-brady syndrome [facility])    a. s/p MDT His Bundle pacemaker implant 2018 - Dr Caryl Comes   Past Surgical History:  Procedure Laterality Date   APPENDECTOMY     age 35   CARDIAC CATHETERIZATION N/A 12/15/2014   Procedure: Left Heart Cath and Coronary Angiography;  Surgeon: Burnell Blanks, MD;  Location: [facility] CV LAB;  Service: Cardiovascular;  Laterality: N/A;   CATARACT EXTRACTION     Right Eye   DILATION AND CURETTAGE OF UTERUS  1990's   benign polyp   PACEMAKER IMPLANT N/A 02/16/2017   Procedure: Pacemaker Implant;  Surgeon: Deboraha Sprang, MD;  Location: [place] CV LAB;  Service: Cardiovascular;  Laterality: N/A;   TONSILLECTOMY AND ADENOIDECTOMY     --age 42   Patient Active Problem List   Diagnosis Date Noted   Pacemaker 12/06/2020   Syncope 12/07/2016   Sinus node dysfunction (HCC) 12/07/2016   Paroxysmal atrial fibrillation ([place]) 12/07/2016   Hyperlipidemia 01/18/2016   Atrial tachycardia 02/13/2015   Essential hypertension 02/13/2015   Chronic coronary artery disease 12/15/2014    PCP: Lajean Manes MD  REFERRING PROVIDER: Rochele Raring MD  REFERRING DIAG:R26.9 (ICD-10-CM) - Unspecified abnormalities of gait and mobility   THERAPY DIAG:  No diagnosis found.  Rationale for Evaluation and Treatment: Rehabilitation  ONSET DATE: has been progressively worse over the past few months  SUBJECTIVE:   SUBJECTIVE STATEMENT: Patient has long history of peripheral neuropathy.  She feels like her peripheral neuropathy has been increasing over the past few months.  She feels like she has had a decline in balance. She continues to try to walk but  feels like her.  She has had physical therapy in the past which improved her general mobility.  She is using a cane currently.  She has an intermittent left ankle pain.  She does not know why this comes and goes.  PERTINENT HISTORY: Anxiety, Tachycardia, depression, facial spasm,    PAIN:  Are you having pain? Yes: NPRS scale: 0/10 Pain location: pain in left lateral ankle comes and goes  Pain description: aches Aggravating factors: just comes and goes  Relieving factors: rest  PRECAUTIONS: Pacemaker  WEIGHT BEARING RESTRICTIONS: No  FALLS:  Has patient fallen in last 6 months? No  LIVING ENVIRONMENT:   OCCUPATION: retired  Hobbies: liketto walk    PLOF: Independent  PATIENT GOALS:  To improve her balance   NEXT MD VISIT:   OBJECTIVE:   DIAGNOSTIC FINDINGS:    PATIENT SURVEYS:  FOTO  COGNITION: Overall cognitive status: Within functional limits for tasks assessed     SENSATION: Bilateral LE numbness  POSTURE: No Significant postural limitations  PALPATION: No significant tenderness to palpation   LOWER EXTREMITY ROM:  Passive ROM Right eval Left eval  Hip flexion    Hip extension    Hip abduction    Hip adduction    Hip internal rotation    Hip external rotation    Knee flexion    Knee extension    Ankle dorsiflexion    Ankle plantarflexion    Ankle inversion    Ankle eversion     (  Blank rows = not tested)  LOWER EXTREMITY MMT:  Full ROM    FUNCTIONAL TESTS:  Berg Balance Scale: 34  BERG Balance Test          Date:   Sit to Stand 2  Standing unsupported 4  Sitting with back unsupported but feet supported 3  Stand to sit  3  Transfers  3  Standing unsupported with eyes closed 3  Standing unsupported feet together 3  From standing position, reach forward with outstretched arm 2  From standing position, pick up object from floor 3  From standing position, turn and look behind over each shoulder 2  Turn 360 2  Standing  unsupported, alternately place foot on step 2  Standing unsupported, one foot in front 1  Standing on one leg 1  Total:  34     GAIT: Small stride length; decreased hip flexion. Uses cane   TODAY'S TREATMENT:                                                                                                                              DATE:  X3NFPYDD  Printed and reviewed old HEP. We will review all of the exercises as we go .  PATIENT EDUCATION:  Education details: HEP, progression of balance activity  Person educated: Patient Education method: Explanation, Demonstration, Tactile cues, Verbal cues, and Handouts Education comprehension: verbalized understanding, returned demonstration, verbal cues required, tactile cues required, and needs further education  HOME EXERCISE PROGRAM: [id number]   ASSESSMENT:  CLINICAL IMPRESSION: Patient is an 81 year old female who presents with declining balance over the past 2 months.  Her Berg balance test score has declined compared to her last episode of physical therapy.  She has maintained her strength.  She lost her exercises.  We reprinted her exercises today.  She has been using her elliptical at home and walking outside but she feels like her balance has affected her ability to walk outside.  She would benefit from skilled therapy to improve her safety with ambulation in the community. OBJECTIVE IMPAIRMENTS: Abnormal gait, decreased activity tolerance, decreased endurance, decreased mobility, difficulty walking, and pain.   ACTIVITY LIMITATIONS: carrying, lifting, standing, squatting, transfers, and locomotion level  PARTICIPATION LIMITATIONS: meal prep, cleaning, laundry, driving, shopping, community activity, and yard work  PERSONAL FACTORS: Age and 1-2 comorbidities: peripheral neuropathy   are also affecting patient's functional outcome.   REHAB POTENTIAL: Good  CLINICAL DECISION MAKING: Stable/uncomplicated  EVALUATION COMPLEXITY:  Low   GOALS: Goals reviewed with patient? Yes  SHORT TERM GOALS: Target date: 08/05/2022  Patient will increase her Berg balance scale score by 5 points Baseline: Goal status: INITIAL  2.  Patient will increase gross lower extremity strength by 5 pounds Baseline:  Goal status: INITIAL  3.  Patient will be independent with basic exercise and balance program Baseline:  Goal status: INITIAL   LONG TERM GOALS: Target date: 08/05/2022   Patient will walk  in the community Baseline:  Goal status: INITIAL  2.  Patient will go down the stairs with reciprocal gait without fear of falling Baseline:  Goal status: INITIAL  3.  Patient will be independent with complete balance and strengthening program Baseline:  Goal status: INITIAL  PLAN:  PT FREQUENCY: 1-2x/week  PT DURATION: 12 weeks  PLANNED INTERVENTIONS: Therapeutic exercises, Therapeutic activity, Neuromuscular re-education, Balance training, Gait training, Patient/Family education, Self Care, Joint mobilization, DME instructions, Aquatic Therapy, Cryotherapy, Moist heat, Taping, and Manual therapy  PLAN FOR NEXT SESSION: begin with base balance exercises and move to more advanced balance activity   Carney Living, PT 06/24/2022, 12:42 PM

## 2022-06-25 ENCOUNTER — Telehealth: Payer: Self-pay

## 2022-06-25 NOTE — Telephone Encounter (Signed)
        Patient  visited [place] on 11/11    Telephone encounter attempt :  1st  A HIPAA compliant voice message was left requesting a return call.  Instructed patient to call back    [facility] [employer]  [phone] [address], [place], [place] [address] Phone: [phone] Email: [email or web address]

## 2022-06-26 DIAGNOSIS — N952 Postmenopausal atrophic vaginitis: Secondary | ICD-10-CM | POA: Diagnosis not present

## 2022-06-26 DIAGNOSIS — L304 Erythema intertrigo: Secondary | ICD-10-CM | POA: Diagnosis not present

## 2022-06-26 DIAGNOSIS — I872 Venous insufficiency (chronic) (peripheral): Secondary | ICD-10-CM | POA: Diagnosis not present

## 2022-06-26 DIAGNOSIS — G629 Polyneuropathy, unspecified: Secondary | ICD-10-CM | POA: Diagnosis not present

## 2022-06-26 DIAGNOSIS — I10 Essential (primary) hypertension: Secondary | ICD-10-CM | POA: Diagnosis not present

## 2022-06-26 DIAGNOSIS — F325 Major depressive disorder, single episode, in full remission: Secondary | ICD-10-CM | POA: Diagnosis not present

## 2022-07-10 ENCOUNTER — Telehealth: Payer: Self-pay | Admitting: Internal Medicine

## 2022-07-10 DIAGNOSIS — F330 Major depressive disorder, recurrent, mild: Secondary | ICD-10-CM | POA: Diagnosis not present

## 2022-07-10 NOTE — Telephone Encounter (Signed)
  Pt would like to make sure her remote pacer checks scheduled for next year

## 2022-07-11 NOTE — Telephone Encounter (Signed)
I scheduled the patient a remote check for 08/20/2022 and let the patient know.

## 2022-07-14 ENCOUNTER — Ambulatory Visit: Payer: Medicare Other | Admitting: Student

## 2022-07-18 ENCOUNTER — Encounter (HOSPITAL_BASED_OUTPATIENT_CLINIC_OR_DEPARTMENT_OTHER): Payer: Medicare Other | Admitting: Physical Therapy

## 2022-07-28 ENCOUNTER — Ambulatory Visit (HOSPITAL_BASED_OUTPATIENT_CLINIC_OR_DEPARTMENT_OTHER): Payer: Medicare Other | Attending: Internal Medicine | Admitting: Physical Therapy

## 2022-07-28 ENCOUNTER — Encounter (HOSPITAL_BASED_OUTPATIENT_CLINIC_OR_DEPARTMENT_OTHER): Payer: Self-pay | Admitting: Physical Therapy

## 2022-07-28 DIAGNOSIS — R2689 Other abnormalities of gait and mobility: Secondary | ICD-10-CM | POA: Insufficient documentation

## 2022-07-28 NOTE — Therapy (Signed)
OUTPATIENT PHYSICAL THERAPY LOWER EXTREMITY EVALUATION   Patient Name: Katherine Cardenas MRN: [id number] DOB:[birth year], 81 y.o., female Today's Date: 07/28/2022   PT End of Session - 07/28/22 1144     Visit Number 2    Number of Visits 8    Date for PT Re-Evaluation 09/16/22    PT Start Time 2505    PT Stop Time 1225    PT Time Calculation (min) 40 min    Activity Tolerance Patient tolerated treatment well    Behavior During Therapy [facility] for tasks assessed/performed             Past Medical History:  Diagnosis Date   Anxiety    Atrial fibrillation (HCC)    Atrial tachycardia    Depression    Facial spasm    Hypertension    Tachy-brady syndrome ([place])    a. s/p MDT His Bundle pacemaker implant 2018 - Dr Caryl Comes   Past Surgical History:  Procedure Laterality Date   APPENDECTOMY     age 35   CARDIAC CATHETERIZATION N/A 12/15/2014   Procedure: Left Heart Cath and Coronary Angiography;  Surgeon: Burnell Blanks, MD;  Location: [place] CV LAB;  Service: Cardiovascular;  Laterality: N/A;   CATARACT EXTRACTION     Right Eye   DILATION AND CURETTAGE OF UTERUS  1990's   benign polyp   PACEMAKER IMPLANT N/A 02/16/2017   Procedure: Pacemaker Implant;  Surgeon: Deboraha Sprang, MD;  Location: [place] CV LAB;  Service: Cardiovascular;  Laterality: N/A;   TONSILLECTOMY AND ADENOIDECTOMY     --age 35   Patient Active Problem List   Diagnosis Date Noted   Pacemaker 12/06/2020   Syncope 12/07/2016   Sinus node dysfunction (HCC) 12/07/2016   Paroxysmal atrial fibrillation ([place]) 12/07/2016   Hyperlipidemia 01/18/2016   Atrial tachycardia 02/13/2015   Essential hypertension 02/13/2015   Chronic coronary artery disease 12/15/2014    PCP: Lajean Manes MD  REFERRING PROVIDER: Rochele Raring MD  REFERRING DIAG:R26.9 (ICD-10-CM) - Unspecified abnormalities of gait and mobility   THERAPY DIAG:  Other abnormalities of gait and mobility  Rationale for Evaluation and  Treatment: Rehabilitation  ONSET DATE: has been progressively worse over the past few months  SUBJECTIVE:   SUBJECTIVE STATEMENT: The patient reports she hasn't been able to be really do many of her exercises. She reports some weakness in her left arm. She reports it has been going on a while and hadn't changed. She was advised if it changes or gets worto contact her MD right away.   PERTINENT HISTORY: Anxiety, Tachycardia, depression, facial spasm,    PAIN:  Are you having pain? Yes: NPRS scale: 0/10 Pain location: pain in left lateral ankle comes and goes  Pain description: aches Aggravating factors: just comes and goes  Relieving factors: rest  PRECAUTIONS: Pacemaker  WEIGHT BEARING RESTRICTIONS: No  FALLS:  Has patient fallen in last 6 months? No  LIVING ENVIRONMENT:   OCCUPATION: retired  Hobbies: liketto walk    PLOF: Independent  PATIENT GOALS:  To improve her balance   NEXT MD VISIT:   OBJECTIVE:   DIAGNOSTIC FINDINGS:    PATIENT SURVEYS:  FOTO  COGNITION: Overall cognitive status: Within functional limits for tasks assessed     SENSATION: Bilateral LE numbness  POSTURE: No Significant postural limitations  PALPATION: No significant tenderness to palpation   LOWER EXTREMITY ROM:  Passive ROM Right eval Left eval  Hip flexion    Hip extension  Hip abduction    Hip adduction    Hip internal rotation    Hip external rotation    Knee flexion    Knee extension    Ankle dorsiflexion    Ankle plantarflexion    Ankle inversion    Ankle eversion     (Blank rows = not tested)  LOWER EXTREMITY MMT:  Full ROM    FUNCTIONAL TESTS:  Berg Balance Scale: 34  BERG Balance Test          Date:   Sit to Stand 2  Standing unsupported 4  Sitting with back unsupported but feet supported 3  Stand to sit  3  Transfers  3  Standing unsupported with eyes closed 3  Standing unsupported feet together 3  From standing position, reach forward  with outstretched arm 2  From standing position, pick up object from floor 3  From standing position, turn and look behind over each shoulder 2  Turn 360 2  Standing unsupported, alternately place foot on step 2  Standing unsupported, one foot in front 1  Standing on one leg 1  Total:  34     GAIT: Small stride length; decreased hip flexion. Uses cane   TODAY'S TREATMENT:                                                                                                                              DATE:  X3NFPYDD  Printed and reviewed old HEP. We will review all of the exercises as we go .  12/18 Reviewed use of nu-step and where she can purchase an exercises bike  Reviewed thera-cane use  Row 2x15 green  Shoulder extension 2x15 green   Bilateral ER 2x15 red  Horizontal abduction 2x15 red  Shoulder flexion with band 2x15 red    PATIENT EDUCATION:  Education details: HEP, progression of balance activity  Person educated: Patient Education method: Explanation, Demonstration, Tactile cues, Verbal cues, and Handouts Education comprehension: verbalized understanding, returned demonstration, verbal cues required, tactile cues required, and needs further education  HOME EXERCISE PROGRAM: [id number]   ASSESSMENT:  CLINICAL IMPRESSION: The patient reports she plans on being more consistent with her program. We reviewed an postural UE portion of her program with her today. She tolerated well. She reports that her L UE has felt more weak over the past few weeks. She was advised if this persists to contact her MD right away. She does not feel any numbness or lack of ability to use it. She reports it is just weak. We also reviewed proper technique for sit to stand transfer. With a minor technique adjustment she was able to stand much more easily.   OBJECTIVE IMPAIRMENTS: Abnormal gait, decreased activity tolerance, decreased endurance, decreased mobility, difficulty walking, and pain.    ACTIVITY LIMITATIONS: carrying, lifting, standing, squatting, transfers, and locomotion level  PARTICIPATION LIMITATIONS: meal prep, cleaning, laundry, driving, shopping, community activity, and yard work  PERSONAL FACTORS: Age  and 1-2 comorbidities: peripheral neuropathy   are also affecting patient's functional outcome.   REHAB POTENTIAL: Good  CLINICAL DECISION MAKING: Stable/uncomplicated  EVALUATION COMPLEXITY: Low   GOALS: Goals reviewed with patient? Yes  SHORT TERM GOALS: Target date: 09/08/2022  Patient will increase her Berg balance scale score by 5 points Baseline: Goal status: INITIAL  2.  Patient will increase gross lower extremity strength by 5 pounds Baseline:  Goal status: INITIAL  3.  Patient will be independent with basic exercise and balance program Baseline:  Goal status: INITIAL   LONG TERM GOALS: Target date: 09/08/2022   Patient will walk in the community Baseline:  Goal status: INITIAL  2.  Patient will go down the stairs with reciprocal gait without fear of falling Baseline:  Goal status: INITIAL  3.  Patient will be independent with complete balance and strengthening program Baseline:  Goal status: INITIAL  PLAN:  PT FREQUENCY: 1-2x/week  PT DURATION: 12 weeks  PLANNED INTERVENTIONS: Therapeutic exercises, Therapeutic activity, Neuromuscular re-education, Balance training, Gait training, Patient/Family education, Self Care, Joint mobilization, DME instructions, Aquatic Therapy, Cryotherapy, Moist heat, Taping, and Manual therapy  PLAN FOR NEXT SESSION: begin with base balance exercises and move to more advanced balance activity   Carney Living, PT 07/28/2022, 11:49 AM

## 2022-08-10 DIAGNOSIS — F330 Major depressive disorder, recurrent, mild: Secondary | ICD-10-CM | POA: Diagnosis not present

## 2022-08-12 NOTE — Progress Notes (Unsigned)
Electrophysiology Office Note Date: 08/19/2022  ID:  Katherine Cardenas, DOB 16-Jul-1941, MRN [id number]  PCP: Lajean Manes, MD Primary Cardiologist: None Electrophysiologist: Virl Axe, MD   CC: Pacemaker follow-up  Katherine Cardenas is a 82 y.o. female w PMH notable for Atach, parox Afib, tachy-brady syndrome s/p PPM, HTN; seen today for Virl Axe, MD for routine electrophysiology followup. Since last being seen in our clinic the patient reports doing ***.    Last saw PA Tillery 06/2021, felt as though her heart was racing at times; started 12.'5mg'$  BID lopressor (pt preferred over Toprol), amlodipine decreased. Cotninued propafenone and diltiazem.  Went to ER 02/2022 for chest discomfort, determined to be GI /indigestion  Today, ***  she denies chest pain, palpitations, dyspnea, PND, orthopnea, nausea, vomiting, dizziness, syncope, edema, weight gain, or early satiety.   Last remote transmission showed AF burden 0.2%, 1 parox AF episode 10h  *** AF burden / episodes *** palpitations   Device History: MDT dual chamber PPM, implanted 01/17/2017; dx tachy-brady syndrome  Past Medical History:  Diagnosis Date   Anxiety    Atrial fibrillation (HCC)    Atrial tachycardia    Depression    Facial spasm    Hypertension    Tachy-brady syndrome ([place])    a. s/p MDT His Bundle pacemaker implant 2018 - Dr Caryl Comes   Past Surgical History:  Procedure Laterality Date   APPENDECTOMY     age 64   CARDIAC CATHETERIZATION N/A 12/15/2014   Procedure: Left Heart Cath and Coronary Angiography;  Surgeon: Burnell Blanks, MD;  Location: [facility];  Service: Cardiovascular;  Laterality: N/A;   CATARACT EXTRACTION     Right Eye   DILATION AND CURETTAGE OF UTERUS  1990's   benign polyp   PACEMAKER IMPLANT N/A 02/16/2017   Procedure: Pacemaker Implant;  Surgeon: Deboraha Sprang, MD;  Location: [facility];  Service: Cardiovascular;  Laterality: N/A;   TONSILLECTOMY AND  ADENOIDECTOMY     --age 68    Current Outpatient Medications  Medication Sig Dispense Refill   amLODipine (NORVASC) 2.5 MG tablet Take 1 tablet (2.5 mg total) by mouth daily. Please contact our office to schedule an appointment for future refills. [phone]. 1st attempt 90 tablet 3   Ascorbic Acid (VITAMIN C) 1000 MG tablet Take 500 mg by mouth daily before supper.      aspirin EC 81 MG tablet Take 81 mg by mouth daily.     cholecalciferol (VITAMIN D) 1000 UNITS tablet Take 1,000 Units by mouth at bedtime.      Coenzyme Q10 200 MG capsule Take 200 mg by mouth daily.     COVID-19 mRNA bivalent vaccine, Moderna, (MODERNA COVID-19 BIVAL BOOSTER) 50 MCG/0.5ML injection Inject into the muscle. 0.5 mL 0   COVID-19 mRNA Vac-TriS, Pfizer, (PFIZER-BIONT COVID-19 VAC-TRIS) SUSP injection Inject into the muscle. 0.3 mL 0   Cranberry 500 MG TABS Take 500 mg by mouth daily.     diltiazem (CARDIZEM) 30 MG tablet TAKE 1 TABLET DAILY AS NEEDED. FOR HEART RATE>100. 90 tablet 3   Diphenhyd-Calamine-Benzyl Alc (IVAREST POISON IVY ITCH EX) Apply 1 application topically daily as needed (itching).     doxycycline (VIBRAMYCIN) 100 MG capsule Take 1 capsule (100 mg total) by mouth 2 (two) times daily. 20 capsule 0   ketoconazole (NIZORAL) 2 % cream Apply 1 Application topically daily. 15 g 1   LORazepam (ATIVAN) 0.5 MG tablet Take 0.25 mg by mouth 2 (  two) times daily.     Magnesium 400 MG CAPS Take 400 mg by mouth daily.     metoprolol tartrate (LOPRESSOR) 25 MG tablet Take 0.5 tablets (12.5 mg total) by mouth 2 (two) times daily. 90 tablet 3   nitroGLYCERIN (NITROSTAT) 0.4 MG SL tablet Place 1 tablet (0.4 mg total) under the tongue every 5 (five) minutes x 3 doses as needed for chest pain. 25 tablet 3   Polyethyl Glycol-Propyl Glycol (SYSTANE OP) Place 1 drop into both eyes 2 (two) times daily.     Probiotic Product (PROBIOTIC COMPLEX ACIDOPHILUS) CAPS Take 1 tablet by mouth daily. "Enzyme Probiotic Complex"      propafenone (RYTHMOL) 150 MG tablet TAKE 1 TABLET BY MOUTH EVERY 12 HOURS. 180 tablet 1   Turmeric 500 MG CAPS Take 500 mg by mouth at bedtime.     vitamin B-12 (CYANOCOBALAMIN) 1000 MCG tablet Take 1,000 mcg by mouth every other day.     No current facility-administered medications for this visit.    Allergies:   Augmentin [amoxicillin-pot clavulanate], Ciprofloxacin, Diltiazem, Epinephrine, Novocain [procaine], Avapro [irbesartan], Shrimp [shellfish allergy], Iohexol, Macrobid [nitrofurantoin], Penicillins, and Zithromax [azithromycin]   Social History: Social History   Socioeconomic History   Marital status: Married    Spouse name: Not on file   Number of children: Not on file   Years of education: Not on file   Highest education level: Not on file  Occupational History   Not on file  Tobacco Use   Smoking status: Never   Smokeless tobacco: Never  Vaping Use   Vaping Use: Never used  Substance and Sexual Activity   Alcohol use: Yes    Alcohol/week: 14.0 standard drinks of alcohol    Types: 14 Glasses of wine per week   Drug use: No   Sexual activity: Never    Partners: Male    Birth control/protection: Post-menopausal  Other Topics Concern   Not on file  Social History Narrative   Not on file   Social Determinants of Health   Financial Resource Strain: Not on file  Food Insecurity: Not on file  Transportation Needs: Not on file  Physical Activity: Not on file  Stress: Not on file  Social Connections: Not on file  Intimate Partner Violence: Not on file    Family History: Family History  Problem Relation Age of Onset   Stomach cancer Father 70   Hypertension Mother    Renal Disease Mother 18   Kidney failure Mother    Hypertension Maternal Grandmother    Heart attack Maternal Grandmother      Review of Systems: All other systems reviewed and are otherwise negative except as noted above.  Physical Exam: There were no vitals filed for this visit.    GEN- The patient is well appearing, alert and oriented x 3 today.   HEENT: normocephalic, atraumatic; sclera clear, conjunctiva pink; hearing intact; oropharynx clear; neck supple, no JVP Lymph- no cervical lymphadenopathy Lungs- Clear to ausculation bilaterally, normal work of breathing.  No wheezes, rales, rhonchi Heart- {Blank single:19197::"Regular","Irregularly irregular"}  rate and rhythm, no murmurs, rubs or gallops, PMI not laterally displaced GI- soft, non-tender, non-distended, bowel sounds present, no hepatosplenomegaly Extremities- no clubbing or cyanosis. {EDEMA EQAST:41962} peripheral edema; DP/PT/radial pulses 2+ bilaterally MS- no significant deformity or atrophy Skin- warm and dry, no rash or lesion; PPM pocket well healed Psych- euthymic mood, full affect Neuro- strength and sensation are intact  PPM Interrogation-  reviewed in detail today,  See PACEART report.  EKG:  EKG is ordered today. Personal review of ekg ordered today shows ***   Recent Labs: 02/26/2022: ALT 13; BUN 13; Creatinine, Ser 0.78; Hemoglobin 15.6; Platelets 256; Potassium 4.0; Sodium 138   Wt Readings from Last 3 Encounters:  06/21/22 130 lb (59 kg)  09/13/21 142 lb (64.4 kg)  06/18/21 138 lb 3.2 oz (62.7 kg)     Other studies Reviewed: Additional studies/ records that were reviewed today include: Previous EP office notes, Previous remote checks, Most recent labwork.   {Select studies to display:26339}  Assessment and Plan:  1. Tachy-Brady syndrome s/p Medtronic PPM  Normal PPM function See Pace Art report No changes today  2. Atach Well controlled on propafenone + diltiazem *** intervals  3. Paroxysmal AF *** burden For now, following sub-clinical AFib by pt preference.  Continue lopressor 12.5 mg BID  #) Hypercoag due to Afib CHA2DS2-VASc Score = 5 [CHF History: 0, HTN History: 1, Diabetes History: 0, Stroke History: 0, Vascular Disease History: 1, Age Score: 2, Gender Score:  1].  Therefore, the patient's annual risk of stroke is 7.2 %     {Confirm score is correct.  If not, click here to update score.  REFRESH note.  :1}    #) HTN *** goal today.  Recommend checking blood pressures 1-2 times per week at home and recording the values.  Recommend bringing these recordings to the primary care physician.   Current medicines are reviewed at length with the patient today.    Labs/ tests ordered today include: *** No orders of the defined types were placed in this encounter.    Disposition:   Follow up with {EPPROVIDERS:28135} in {Blank single:19197::"2 weeks","4 weeks","3 months","6 months","12 months","as usual post gen change"}    Signed, Mamie Levers, NP  08/19/2022 7:43 PM  [place] [address] [phone] (office) [phone] (fax)

## 2022-08-15 ENCOUNTER — Other Ambulatory Visit: Payer: Self-pay | Admitting: Internal Medicine

## 2022-08-19 ENCOUNTER — Encounter (HOSPITAL_BASED_OUTPATIENT_CLINIC_OR_DEPARTMENT_OTHER): Payer: Medicare Other | Admitting: Physical Therapy

## 2022-08-19 ENCOUNTER — Ambulatory Visit (INDEPENDENT_AMBULATORY_CARE_PROVIDER_SITE_OTHER): Payer: Medicare Other

## 2022-08-19 DIAGNOSIS — I495 Sick sinus syndrome: Secondary | ICD-10-CM

## 2022-08-19 LAB — CUP PACEART REMOTE DEVICE CHECK
Battery Remaining Longevity: 102 mo
Battery Voltage: 2.98 V
Brady Statistic AP VP Percent: 0.28 %
Brady Statistic AP VS Percent: 68.66 %
Brady Statistic AS VP Percent: 0 %
Brady Statistic AS VS Percent: 31.06 %
Brady Statistic RA Percent Paced: 72.78 %
Brady Statistic RV Percent Paced: 0.31 %
Date Time Interrogation Session: 20240108214224
Implantable Lead Connection Status: 753985
Implantable Lead Connection Status: 753985
Implantable Lead Implant Date: 20180709
Implantable Lead Implant Date: 20180709
Implantable Lead Location: 753859
Implantable Lead Location: 753860
Implantable Lead Model: 3830
Implantable Lead Model: 5076
Implantable Pulse Generator Implant Date: 20180709
Lead Channel Impedance Value: 285 Ohm
Lead Channel Impedance Value: 323 Ohm
Lead Channel Impedance Value: 380 Ohm
Lead Channel Impedance Value: 437 Ohm
Lead Channel Pacing Threshold Amplitude: 0.625 V
Lead Channel Pacing Threshold Pulse Width: 0.4 ms
Lead Channel Sensing Intrinsic Amplitude: 2.125 mV
Lead Channel Sensing Intrinsic Amplitude: 2.125 mV
Lead Channel Sensing Intrinsic Amplitude: 7.875 mV
Lead Channel Sensing Intrinsic Amplitude: 7.875 mV
Lead Channel Setting Pacing Amplitude: 1.5 V
Lead Channel Setting Pacing Amplitude: 2.5 V
Lead Channel Setting Pacing Pulse Width: 1 ms
Lead Channel Setting Sensing Sensitivity: 2 mV
Zone Setting Status: 755011
Zone Setting Status: 755011

## 2022-08-20 ENCOUNTER — Ambulatory Visit: Payer: Medicare Other | Attending: Internal Medicine | Admitting: Cardiology

## 2022-08-20 ENCOUNTER — Encounter: Payer: Self-pay | Admitting: Student

## 2022-08-20 VITALS — BP 170/88 | HR 61 | Ht 66.0 in | Wt 134.8 lb

## 2022-08-20 DIAGNOSIS — R2689 Other abnormalities of gait and mobility: Secondary | ICD-10-CM | POA: Insufficient documentation

## 2022-08-20 DIAGNOSIS — I4719 Other supraventricular tachycardia: Secondary | ICD-10-CM | POA: Diagnosis not present

## 2022-08-20 DIAGNOSIS — Z950 Presence of cardiac pacemaker: Secondary | ICD-10-CM | POA: Diagnosis not present

## 2022-08-20 DIAGNOSIS — I10 Essential (primary) hypertension: Secondary | ICD-10-CM | POA: Diagnosis not present

## 2022-08-20 DIAGNOSIS — I495 Sick sinus syndrome: Secondary | ICD-10-CM | POA: Diagnosis not present

## 2022-08-20 DIAGNOSIS — I480 Paroxysmal atrial fibrillation: Secondary | ICD-10-CM | POA: Diagnosis not present

## 2022-08-20 LAB — CUP PACEART INCLINIC DEVICE CHECK
Battery Remaining Longevity: 102 mo
Battery Voltage: 2.98 V
Brady Statistic AP VP Percent: 0.29 %
Brady Statistic AP VS Percent: 58.49 %
Brady Statistic AS VP Percent: 0.01 %
Brady Statistic AS VS Percent: 41.21 %
Brady Statistic RA Percent Paced: 60.91 %
Brady Statistic RV Percent Paced: 0.31 %
Date Time Interrogation Session: 20240110134926
Implantable Lead Connection Status: 753985
Implantable Lead Connection Status: 753985
Implantable Lead Implant Date: 20180709
Implantable Lead Implant Date: 20180709
Implantable Lead Location: 753859
Implantable Lead Location: 753860
Implantable Lead Model: 3830
Implantable Lead Model: 5076
Implantable Pulse Generator Implant Date: 20180709
Lead Channel Impedance Value: 266 Ohm
Lead Channel Impedance Value: 342 Ohm
Lead Channel Impedance Value: 380 Ohm
Lead Channel Impedance Value: 456 Ohm
Lead Channel Pacing Threshold Amplitude: 0.625 V
Lead Channel Pacing Threshold Pulse Width: 0.4 ms
Lead Channel Sensing Intrinsic Amplitude: 1.125 mV
Lead Channel Sensing Intrinsic Amplitude: 2.125 mV
Lead Channel Sensing Intrinsic Amplitude: 8 mV
Lead Channel Sensing Intrinsic Amplitude: 9.375 mV
Lead Channel Setting Pacing Amplitude: 1.5 V
Lead Channel Setting Pacing Amplitude: 2.5 V
Lead Channel Setting Pacing Pulse Width: 1 ms
Lead Channel Setting Sensing Sensitivity: 2 mV
Zone Setting Status: 755011
Zone Setting Status: 755011

## 2022-08-20 NOTE — Patient Instructions (Signed)
Medication Instructions:  Your physician recommends that you continue on your current medications as directed. Please refer to the Current Medication list given to you today.  *If you need a refill on your cardiac medications before your next appointment, please call your pharmacy*   Lab Work: TODAY: BMET, CBC  If you have labs (blood work) drawn today and your tests are completely normal, you will receive your results only by: [place] (if you have MyChart) OR A paper copy in the mail If you have any lab test that is abnormal or we need to change your treatment, we will call you to review the results.   Follow-Up: At [facility], you and your health needs are our priority.  As part of our continuing mission to provide you with exceptional heart care, we have created designated Provider Care Teams.  These Care Teams include your primary Cardiologist (physician) and Advanced Practice Providers (APPs -  Physician Assistants and Nurse Practitioners) who all work together to provide you with the care you need, when you need it.   Your next appointment:   6 month(s)  The format for your next appointment:   In Person  Provider:   Legrand Como "Jonni Sanger" Tillery, PA-C   Other Instructions CHA2DS2-VASc Score = 5 [CHF History: 0, HTN History: 1, Diabetes History: 0, Stroke History: 0, Vascular Disease History: 1, Age Score: 2, Gender Score: 1].  Therefore, the patient's annual risk of stroke is 7.2 %

## 2022-08-21 LAB — BASIC METABOLIC PANEL
BUN/Creatinine Ratio: 15 (ref 12–28)
BUN: 12 mg/dL (ref 8–27)
CO2: 24 mmol/L (ref 20–29)
Calcium: 9.7 mg/dL (ref 8.7–10.3)
Chloride: 103 mmol/L (ref 96–106)
Creatinine, Ser: 0.8 mg/dL (ref 0.57–1.00)
Glucose: 102 mg/dL — ABNORMAL HIGH (ref 70–99)
Potassium: 4.5 mmol/L (ref 3.5–5.2)
Sodium: 139 mmol/L (ref 134–144)
eGFR: 73 mL/min/{1.73_m2} (ref >59)

## 2022-08-21 LAB — CBC
Hematocrit: 45.9 % (ref 34.0–46.6)
Hemoglobin: 15 g/dL (ref 11.1–15.9)
MCH: 30.4 pg (ref 26.6–33.0)
MCHC: 32.7 g/dL (ref 31.5–35.7)
MCV: 93 fL (ref 79–97)
Platelets: 278 10*3/uL (ref 150–450)
RBC: 4.94 x10E6/uL (ref 3.77–5.28)
RDW: 12.4 % (ref 11.7–15.4)
WBC: 6.8 10*3/uL (ref 3.4–10.8)

## 2022-08-25 NOTE — Addendum Note (Signed)
Addended by: Mamie Levers on: 08/25/2022 10:08 AM   Modules accepted: Orders

## 2022-09-02 ENCOUNTER — Ambulatory Visit (HOSPITAL_BASED_OUTPATIENT_CLINIC_OR_DEPARTMENT_OTHER): Payer: Medicare Other | Admitting: Physical Therapy

## 2022-09-02 ENCOUNTER — Encounter (HOSPITAL_BASED_OUTPATIENT_CLINIC_OR_DEPARTMENT_OTHER): Payer: Self-pay | Admitting: Physical Therapy

## 2022-09-02 ENCOUNTER — Other Ambulatory Visit (HOSPITAL_BASED_OUTPATIENT_CLINIC_OR_DEPARTMENT_OTHER): Payer: Self-pay

## 2022-09-02 DIAGNOSIS — I10 Essential (primary) hypertension: Secondary | ICD-10-CM | POA: Diagnosis not present

## 2022-09-02 DIAGNOSIS — R2689 Other abnormalities of gait and mobility: Secondary | ICD-10-CM

## 2022-09-02 DIAGNOSIS — I480 Paroxysmal atrial fibrillation: Secondary | ICD-10-CM | POA: Diagnosis not present

## 2022-09-02 DIAGNOSIS — Z23 Encounter for immunization: Secondary | ICD-10-CM | POA: Diagnosis not present

## 2022-09-02 DIAGNOSIS — Z950 Presence of cardiac pacemaker: Secondary | ICD-10-CM | POA: Diagnosis not present

## 2022-09-02 DIAGNOSIS — I495 Sick sinus syndrome: Secondary | ICD-10-CM | POA: Diagnosis not present

## 2022-09-02 DIAGNOSIS — I4719 Other supraventricular tachycardia: Secondary | ICD-10-CM | POA: Diagnosis not present

## 2022-09-02 MED ORDER — INFLUENZA VAC A&B SA ADJ QUAD 0.5 ML IM PRSY
PREFILLED_SYRINGE | INTRAMUSCULAR | 0 refills | Status: DC
  Filled 2022-09-02: qty 0.5, 1d supply, fill #0

## 2022-09-02 NOTE — Therapy (Signed)
OUTPATIENT PHYSICAL THERAPY LOWER EXTREMITY EVALUATION   Patient Name: Katherine Cardenas MRN: [id number] DOB:[birth year], 82 y.o., female Today's Date: 09/02/2022   PT End of Session - 09/02/22 1131     Visit Number 3    Number of Visits 8    Date for PT Re-Evaluation 09/16/22    PT Start Time 1100    PT Stop Time 1143    PT Time Calculation (min) 43 min    Activity Tolerance Patient tolerated treatment well    Behavior During Therapy [facility] for tasks assessed/performed              Past Medical History:  Diagnosis Date   Anxiety    Atrial fibrillation (HCC)    Atrial tachycardia    Depression    Facial spasm    Hypertension    Tachy-brady syndrome ([place])    a. s/p MDT His Bundle pacemaker implant 2018 - Dr Caryl Comes   Past Surgical History:  Procedure Laterality Date   APPENDECTOMY     age 63   CARDIAC CATHETERIZATION N/A 12/15/2014   Procedure: Left Heart Cath and Coronary Angiography;  Surgeon: Burnell Blanks, MD;  Location: [place] [facility];  Service: Cardiovascular;  Laterality: N/A;   CATARACT EXTRACTION     Right Eye   DILATION AND CURETTAGE OF UTERUS  1990's   benign polyp   PACEMAKER IMPLANT N/A 02/16/2017   Procedure: Pacemaker Implant;  Surgeon: Deboraha Sprang, MD;  Location: [place] [facility];  Service: Cardiovascular;  Laterality: N/A;   TONSILLECTOMY AND ADENOIDECTOMY     --age 49   Patient Active Problem List   Diagnosis Date Noted   Pacemaker 12/06/2020   Syncope 12/07/2016   Sinus node dysfunction (HCC) 12/07/2016   Paroxysmal atrial fibrillation ([place]) 12/07/2016   Hyperlipidemia 01/18/2016   Atrial tachycardia 02/13/2015   Essential hypertension 02/13/2015   Chronic coronary artery disease 12/15/2014    PCP: Lajean Manes MD  REFERRING PROVIDER: Rochele Raring MD  REFERRING DIAG:R26.9 (ICD-10-CM) - Unspecified abnormalities of gait and mobility   THERAPY DIAG:  Other abnormalities of gait and mobility  Rationale for Evaluation and  Treatment: Rehabilitation  ONSET DATE: has been progressively worse over the past few months  SUBJECTIVE:   SUBJECTIVE STATEMENT: No complaint of weakness in her left arm today. She did have a fall though on Christmas eve. She was outside when it was getting dark. She stepped wrong and fell backwards and hit her head. She has no residual issues form the fall.  PERTINENT HISTORY: Anxiety, Tachycardia, depression, facial spasm,    PAIN:  Are you having pain? Yes: NPRS scale: 0/10 Pain location: pain in left lateral ankle comes and goes  Pain description: aches Aggravating factors: just comes and goes  Relieving factors: rest  PRECAUTIONS: Pacemaker  WEIGHT BEARING RESTRICTIONS: No  FALLS:  Has patient fallen in last 6 months? No  LIVING ENVIRONMENT:   OCCUPATION: retired  Hobbies: liketto walk    PLOF: Independent  PATIENT GOALS:  To improve her balance   NEXT MD VISIT:   OBJECTIVE:   DIAGNOSTIC FINDINGS:    PATIENT SURVEYS:  FOTO  COGNITION: Overall cognitive status: Within functional limits for tasks assessed     SENSATION: Bilateral LE numbness  POSTURE: No Significant postural limitations  PALPATION: No significant tenderness to palpation   LOWER EXTREMITY ROM:  Passive ROM Right eval Left eval  Hip flexion    Hip extension    Hip abduction  Hip adduction    Hip internal rotation    Hip external rotation    Knee flexion    Knee extension    Ankle dorsiflexion    Ankle plantarflexion    Ankle inversion    Ankle eversion     (Blank rows = not tested)  LOWER EXTREMITY MMT:  Full ROM    FUNCTIONAL TESTS:  Berg Balance Scale: 34  BERG Balance Test          Date:   Sit to Stand 2  Standing unsupported 4  Sitting with back unsupported but feet supported 3  Stand to sit  3  Transfers  3  Standing unsupported with eyes closed 3  Standing unsupported feet together 3  From standing position, reach forward with outstretched arm 2   From standing position, pick up object from floor 3  From standing position, turn and look behind over each shoulder 2  Turn 360 2  Standing unsupported, alternately place foot on step 2  Standing unsupported, one foot in front 1  Standing on one leg 1  Total:  34     GAIT: Small stride length; decreased hip flexion. Uses cane   TODAY'S TREATMENT:                                                                                                                              DATE:  [id number]  Printed and reviewed old HEP. We will review all of the exercises as we go . 1/23 Ex-bike: reviewed set up 5 min L1   Standing:  Air-ex:  heel raise 2x15  Standing march 2x15   Step onto air-ex x15 each leg  Hurdle step lateral 2x15   Narrow base eyes open and closed 2x30 sec hold each   Sit to stand 3x5  Sti to transfer and back 3x5     12/18 Reviewed use of nu-step and where she can purchase an exercises bike  Reviewed thera-cane use  Row 2x15 green  Shoulder extension 2x15 green   Bilateral ER 2x15 red  Horizontal abduction 2x15 red  Shoulder flexion with band 2x15 red    PATIENT EDUCATION:  Education details: HEP, progression of balance activity  Person educated: Patient Education method: Explanation, Demonstration, Tactile cues, Verbal cues, and Handouts Education comprehension: verbalized understanding, returned demonstration, verbal cues required, tactile cues required, and needs further education  HOME EXERCISE PROGRAM: [id number]   ASSESSMENT:  CLINICAL IMPRESSION: The patient reports she hasn't not been consistent with her exercises and plan. She reports she plans on becoming more consistent now. She had no residual issues from the fall. We reviewed transfers today. She reported feeling unsteady at times with transfers. The more she practiced the better she did. We also worked on exercises that cause her to have to pick her foot up more. We will continue to progress  as tolerated.   OBJECTIVE IMPAIRMENTS: Abnormal gait, decreased activity tolerance, decreased endurance, decreased mobility,  difficulty walking, and pain.   ACTIVITY LIMITATIONS: carrying, lifting, standing, squatting, transfers, and locomotion level  PARTICIPATION LIMITATIONS: meal prep, cleaning, laundry, driving, shopping, community activity, and yard work  PERSONAL FACTORS: Age and 1-2 comorbidities: peripheral neuropathy   are also affecting patient's functional outcome.   REHAB POTENTIAL: Good  CLINICAL DECISION MAKING: Stable/uncomplicated  EVALUATION COMPLEXITY: Low   GOALS: Goals reviewed with patient? Yes  SHORT TERM GOALS: Target date: 10/14/2022  Patient will increase her Berg balance scale score by 5 points Baseline: Goal status: INITIAL  2.  Patient will increase gross lower extremity strength by 5 pounds Baseline:  Goal status: INITIAL  3.  Patient will be independent with basic exercise and balance program Baseline:  Goal status: INITIAL   LONG TERM GOALS: Target date: 10/14/2022   Patient will walk in the community Baseline:  Goal status: INITIAL  2.  Patient will go down the stairs with reciprocal gait without fear of falling Baseline:  Goal status: INITIAL  3.  Patient will be independent with complete balance and strengthening program Baseline:  Goal status: INITIAL  PLAN:  PT FREQUENCY: 1-2x/week  PT DURATION: 12 weeks  PLANNED INTERVENTIONS: Therapeutic exercises, Therapeutic activity, Neuromuscular re-education, Balance training, Gait training, Patient/Family education, Self Care, Joint mobilization, DME instructions, Aquatic Therapy, Cryotherapy, Moist heat, Taping, and Manual therapy  PLAN FOR NEXT SESSION: begin with base balance exercises and move to more advanced balance activity   Carney Living, PT 09/02/2022, 12:41 PM

## 2022-09-10 DIAGNOSIS — F330 Major depressive disorder, recurrent, mild: Secondary | ICD-10-CM | POA: Diagnosis not present

## 2022-09-11 NOTE — Progress Notes (Signed)
Remote pacemaker transmission.   

## 2022-09-16 ENCOUNTER — Ambulatory Visit (HOSPITAL_BASED_OUTPATIENT_CLINIC_OR_DEPARTMENT_OTHER): Payer: Medicare Other | Attending: Internal Medicine | Admitting: Physical Therapy

## 2022-09-16 ENCOUNTER — Encounter (HOSPITAL_BASED_OUTPATIENT_CLINIC_OR_DEPARTMENT_OTHER): Payer: Self-pay | Admitting: Physical Therapy

## 2022-09-16 DIAGNOSIS — R2689 Other abnormalities of gait and mobility: Secondary | ICD-10-CM | POA: Diagnosis not present

## 2022-09-16 NOTE — Therapy (Signed)
OUTPATIENT PHYSICAL THERAPY LOWER EXTREMITY EVALUATION   Patient Name: Katherine Cardenas MRN: [id number] DOB:[birth year], 82 y.o., female Today's Date: 09/16/2022   PT End of Session - 09/16/22 1127     Visit Number 4    Number of Visits 8    Date for PT Re-Evaluation 09/16/22    PT Start Time 1100    PT Stop Time 1142    PT Time Calculation (min) 42 min    Activity Tolerance Patient tolerated treatment well    Behavior During Therapy [facility] for tasks assessed/performed              Past Medical History:  Diagnosis Date   Anxiety    Atrial fibrillation (HCC)    Atrial tachycardia    Depression    Facial spasm    Hypertension    Tachy-brady syndrome ([place])    a. s/p MDT His Bundle pacemaker implant 2018 - Dr Caryl Comes   Past Surgical History:  Procedure Laterality Date   APPENDECTOMY     age 32   CARDIAC CATHETERIZATION N/A 12/15/2014   Procedure: Left Heart Cath and Coronary Angiography;  Surgeon: Burnell Blanks, MD;  Location: [facility];  Service: Cardiovascular;  Laterality: N/A;   CATARACT EXTRACTION     Right Eye   DILATION AND CURETTAGE OF UTERUS  1990's   benign polyp   PACEMAKER IMPLANT N/A 02/16/2017   Procedure: Pacemaker Implant;  Surgeon: Deboraha Sprang, MD;  Location: [facility];  Service: Cardiovascular;  Laterality: N/A;   TONSILLECTOMY AND ADENOIDECTOMY     --age 26   Patient Active Problem List   Diagnosis Date Noted   Pacemaker 12/06/2020   Syncope 12/07/2016   Sinus node dysfunction (HCC) 12/07/2016   Paroxysmal atrial fibrillation ([place]) 12/07/2016   Hyperlipidemia 01/18/2016   Atrial tachycardia 02/13/2015   Essential hypertension 02/13/2015   Chronic coronary artery disease 12/15/2014    PCP: Lajean Manes MD  REFERRING PROVIDER: Rochele Raring MD  REFERRING DIAG:R26.9 (ICD-10-CM) - Unspecified abnormalities of gait and mobility   THERAPY DIAG:  Other abnormalities of gait and mobility  Rationale for Evaluation and  Treatment: Rehabilitation  ONSET DATE: has been progressively worse over the past few months  SUBJECTIVE:   SUBJECTIVE STATEMENT: The patient reports that   PERTINENT HISTORY: Anxiety, Tachycardia, depression, facial spasm,    PAIN:  Are you having pain? Yes: NPRS scale: 0/10 Pain location: pain in left lateral ankle comes and goes  Pain description: aches Aggravating factors: just comes and goes  Relieving factors: rest  PRECAUTIONS: Pacemaker  WEIGHT BEARING RESTRICTIONS: No  FALLS:  Has patient fallen in last 6 months? No  LIVING ENVIRONMENT:   OCCUPATION: retired  Hobbies: liketto walk    PLOF: Independent  PATIENT GOALS:  To improve her balance   NEXT MD VISIT:   OBJECTIVE:   DIAGNOSTIC FINDINGS:    PATIENT SURVEYS:  FOTO  COGNITION: Overall cognitive status: Within functional limits for tasks assessed     SENSATION: Bilateral LE numbness  POSTURE: No Significant postural limitations  PALPATION: No significant tenderness to palpation   LOWER EXTREMITY ROM:  Passive ROM Right eval Left eval  Hip flexion    Hip extension    Hip abduction    Hip adduction    Hip internal rotation    Hip external rotation    Knee flexion    Knee extension    Ankle dorsiflexion    Ankle plantarflexion    Ankle  inversion    Ankle eversion     (Blank rows = not tested)  LOWER EXTREMITY MMT:  Full ROM    FUNCTIONAL TESTS:  Berg Balance Scale: 34  BERG Balance Test          Date:   Sit to Stand 2  Standing unsupported 4  Sitting with back unsupported but feet supported 3  Stand to sit  3  Transfers  3  Standing unsupported with eyes closed 3  Standing unsupported feet together 3  From standing position, reach forward with outstretched arm 2  From standing position, pick up object from floor 3  From standing position, turn and look behind over each shoulder 2  Turn 360 2  Standing unsupported, alternately place foot on step 2  Standing  unsupported, one foot in front 1  Standing on one leg 1  Total:  34     GAIT: Small stride length; decreased hip flexion. Uses cane   TODAY'S TREATMENT:                                                                                                                              DATE:  X3NFPYDD  Printed and reviewed old HEP. We will review all of the exercises as we go .  2/6  Standing:  Air-ex:  heel raise 2x15  Standing march 2x15   Step onto air-ex x15 each leg  Hurdle step lateral 2x15   Narrow base eyes open and closed 2x30 sec hold each   Sit to stand 3x5  Sti to transfer and back 3x5    1/23 Ex-bike: reviewed set up 5 min L1   Standing:  Air-ex:  heel raise 2x15  Standing march 2x15   Step onto air-ex x15 each leg  Hurdle step lateral 2x15   Narrow base eyes open and closed 2x30 sec hold each   Sit to stand 3x5  Sti to transfer and back 3x5     12/18 Reviewed use of nu-step and where she can purchase an exercises bike  Reviewed thera-cane use  Row 2x15 green  Shoulder extension 2x15 green   Bilateral ER 2x15 red  Horizontal abduction 2x15 red  Shoulder flexion with band 2x15 red    PATIENT EDUCATION:  Education details: HEP, progression of balance activity  Person educated: Patient Education method: Explanation, Demonstration, Tactile cues, Verbal cues, and Handouts Education comprehension: verbalized understanding, returned demonstration, verbal cues required, tactile cues required, and needs further education  HOME EXERCISE PROGRAM: [id number]   ASSESSMENT:  CLINICAL IMPRESSION: The patient reports she hasn't not been consistent with her exercises and plan. She reports she plans on becoming more consistent now. She had no residual issues from the fall. We reviewed transfers today. She reported feeling unsteady at times with transfers. The more she practiced the better she did. We also worked on exercises that cause her to have to pick her  foot up more. We will continue  to progress as tolerated.   OBJECTIVE IMPAIRMENTS: Abnormal gait, decreased activity tolerance, decreased endurance, decreased mobility, difficulty walking, and pain.   ACTIVITY LIMITATIONS: carrying, lifting, standing, squatting, transfers, and locomotion level  PARTICIPATION LIMITATIONS: meal prep, cleaning, laundry, driving, shopping, community activity, and yard work  PERSONAL FACTORS: Age and 1-2 comorbidities: peripheral neuropathy   are also affecting patient's functional outcome.   REHAB POTENTIAL: Good  CLINICAL DECISION MAKING: Stable/uncomplicated  EVALUATION COMPLEXITY: Low   GOALS: Goals reviewed with patient? Yes  SHORT TERM GOALS: Target date: 10/28/2022  Patient will increase her Berg balance scale score by 5 points Baseline: Goal status: INITIAL  2.  Patient will increase gross lower extremity strength by 5 pounds Baseline:  Goal status: INITIAL  3.  Patient will be independent with basic exercise and balance program Baseline:  Goal status: INITIAL   LONG TERM GOALS: Target date: 10/28/2022   Patient will walk in the community Baseline:  Goal status: INITIAL  2.  Patient will go down the stairs with reciprocal gait without fear of falling Baseline:  Goal status: INITIAL  3.  Patient will be independent with complete balance and strengthening program Baseline:  Goal status: INITIAL  PLAN:  PT FREQUENCY: 1-2x/week  PT DURATION: 12 weeks  PLANNED INTERVENTIONS: Therapeutic exercises, Therapeutic activity, Neuromuscular re-education, Balance training, Gait training, Patient/Family education, Self Care, Joint mobilization, DME instructions, Aquatic Therapy, Cryotherapy, Moist heat, Taping, and Manual therapy  PLAN FOR NEXT SESSION: begin with base balance exercises and move to more advanced balance activity   Carney Living, PT 09/16/2022, 11:46 AM

## 2022-09-30 ENCOUNTER — Ambulatory Visit (HOSPITAL_BASED_OUTPATIENT_CLINIC_OR_DEPARTMENT_OTHER): Payer: Medicare Other | Admitting: Physical Therapy

## 2022-09-30 DIAGNOSIS — R2689 Other abnormalities of gait and mobility: Secondary | ICD-10-CM

## 2022-09-30 NOTE — Therapy (Signed)
OUTPATIENT PHYSICAL THERAPY LOWER EXTREMITY EVALUATION   Patient Name: Katherine Cardenas MRN: [id number] DOB:1941-01-10, 82 y.o., female Today's Date: 10/01/2022   PT End of Session - 09/30/22 1115     Visit Number 5    Number of Visits 16    Date for PT Re-Evaluation 11/25/22    PT Start Time 1103    PT Stop Time 1144    PT Time Calculation (min) 41 min    Activity Tolerance Patient tolerated treatment well    Behavior During Therapy [facility] for tasks assessed/performed              Past Medical History:  Diagnosis Date   Anxiety    Atrial fibrillation (HCC)    Atrial tachycardia    Depression    Facial spasm    Hypertension    Tachy-brady syndrome ([place])    a. s/p MDT His Bundle pacemaker implant 2018 - Dr Caryl Comes   Past Surgical History:  Procedure Laterality Date   APPENDECTOMY     age 82   CARDIAC CATHETERIZATION N/A 12/15/2014   Procedure: Left Heart Cath and Coronary Angiography;  Surgeon: Burnell Blanks, MD;  Location: [place];  Service: Cardiovascular;  Laterality: N/A;   CATARACT EXTRACTION     Right Eye   DILATION AND CURETTAGE OF UTERUS  1990's   benign polyp   PACEMAKER IMPLANT N/A 02/16/2017   Procedure: Pacemaker Implant;  Surgeon: Deboraha Sprang, MD;  Location: [facility];  Service: Cardiovascular;  Laterality: N/A;   TONSILLECTOMY AND ADENOIDECTOMY     --age 28   Patient Active Problem List   Diagnosis Date Noted   Pacemaker 12/06/2020   Syncope 12/07/2016   Sinus node dysfunction (HCC) 12/07/2016   Paroxysmal atrial fibrillation ([place]) 12/07/2016   Hyperlipidemia 01/18/2016   Atrial tachycardia 02/13/2015   Essential hypertension 02/13/2015   Chronic coronary artery disease 12/15/2014    PCP: Lajean Manes MD  REFERRING PROVIDER: Rochele Raring MD  REFERRING DIAG:R26.9 (ICD-10-CM) - Unspecified abnormalities of gait and mobility   THERAPY DIAG:  Other abnormalities of gait and mobility  Rationale for Evaluation and  Treatment: Rehabilitation  ONSET DATE: has been progressively worse over the past few months  SUBJECTIVE:   SUBJECTIVE STATEMENT:   The patient comes in today reporting that overall she feels more off balance.  She feels this is not coming from her feet but something more systemic.  She feels less safe with basic ambulation around her house and outside.   PERTINENT HISTORY: Anxiety, Tachycardia, depression, facial spasm,    PAIN:  Are you having pain? Yes: NPRS scale: 0/10 Pain location: pain in left lateral ankle comes and goes  Pain description: aches Aggravating factors: just comes and goes  Relieving factors: rest  PRECAUTIONS: Pacemaker  WEIGHT BEARING RESTRICTIONS: No  FALLS:  Has patient fallen in last 6 months? No  LIVING ENVIRONMENT:   OCCUPATION: retired  Hobbies: liketto walk    PLOF: Independent  PATIENT GOALS:  To improve her balance   NEXT MD VISIT:   OBJECTIVE:   DIAGNOSTIC FINDINGS:    PATIENT SURVEYS:  FOTO  COGNITION: Overall cognitive status: Within functional limits for tasks assessed     SENSATION: Bilateral LE numbness  POSTURE: No Significant postural limitations  PALPATION: No significant tenderness to palpation   LOWER EXTREMITY ROM:  Passive ROM Right eval Left eval  Hip flexion    Hip extension    Hip abduction  Hip adduction    Hip internal rotation    Hip external rotation    Knee flexion    Knee extension    Ankle dorsiflexion    Ankle plantarflexion    Ankle inversion    Ankle eversion     (Blank rows = not tested)  LOWER EXTREMITY MMT:  Full ROM    FUNCTIONAL TESTS:  Berg Balance Scale: 34  BERG Balance Test          Date:   Sit to Stand 4  Standing unsupported 4  Sitting with back unsupported but feet supported 4  Stand to sit  3  Transfers  3  Standing unsupported with eyes closed 4  Standing unsupported feet together 3  From standing position, reach forward with outstretched arm 3   From standing position, pick up object from floor 4  From standing position, turn and look behind over each shoulder 3  Turn 360 3  Standing unsupported, alternately place foot on step 2  Standing unsupported, one foot in front 1  Standing on one leg 3  Total:  44    LOWER EXTREMITY MMT:    MMT Right eval Left eval  Hip flexion 22.7 19.4  Hip extension    Hip abduction 24.0 26.6  Hip adduction    Hip internal rotation    Hip external rotation    Knee flexion    Knee extension 28.4 21.7  Ankle dorsiflexion    Ankle plantarflexion    Ankle inversion    Ankle eversion     (Blank rows = not tested)   GAIT: Small stride length; decreased hip flexion. Uses cane   TODAY'S TREATMENT:                                                                                                                              DATE:  X3NFPYDD  2/21 Self-care: Reviewed different types of walkers.  Reviewed benefits of each.  Reviewed places where she may be able to test walkers such as screen through a medical supply.  Reviewed types of foot  bikes for home use.  Performed progress note.  Reviewed goals.  Reviewed FOTO  Performed Berg balance testing.  Reviewed plan of care and HEP going forward. 2/6  Standing:  Air-ex:  heel raise 2x15  Standing march 2x15   Step onto air-ex x15 each leg  Hurdle step lateral 2x15   Narrow base eyes open and closed 2x30 sec hold each   Sit to stand 3x5  Sti to transfer and back 3x5    1/23 Ex-bike: reviewed set up 5 min L1   Standing:  Air-ex:  heel raise 2x15  Standing march 2x15   Step onto air-ex x15 each leg  Hurdle step lateral 2x15   Narrow base eyes open and closed 2x30 sec hold each   Sit to stand 3x5  Sti to transfer and back 3x5     12/18 Reviewed  use of nu-step and where she can purchase an exercises bike  Reviewed thera-cane use  Row 2x15 green  Shoulder extension 2x15 green   Bilateral ER 2x15 red  Horizontal  abduction 2x15 red  Shoulder flexion with band 2x15 red    PATIENT EDUCATION:  Education details: HEP, progression of balance activity  Person educated: Patient Education method: Explanation, Demonstration, Tactile cues, Verbal cues, and Handouts Education comprehension: verbalized understanding, returned demonstration, verbal cues required, tactile cues required, and needs further education  HOME EXERCISE PROGRAM: [id number]   ASSESSMENT:  CLINICAL IMPRESSION: Despite feeling off balance the patient's objective measures have improved significantly.  She has reached her Foto goal and reach her Merrilee Jansky balance goal.  She is not sure with this new feeling of of imbalance is coming from.  Therapy suggested potentially going to our neuro clinic for more comprehensive balance testing.  She will consider in the future.  Therapy suggested for now if she feels more on balance and more unsafe in her home for potential use a walker.  We reviewed different types of walkers and benefits of each type of walker.  She would like to try some out.  She was advised that local medical supply stores may have walkers that she can use in-store to test out.  She also would like to get a foot like to continue to work on her cardiovascular endurance.  At this time the patient is coming biweekly.  She has shown significant improvement in objective measures and goals during this.  Despite her objective improvements she is to continue to feel like she is imbalanced.  She will go back to her doctor over the next few weeks.  She was advised to talk to her doctor about her new feeling of imbalance.  We will continue 1 week 8 for now.   OBJECTIVE IMPAIRMENTS: Abnormal gait, decreased activity tolerance, decreased endurance, decreased mobility, difficulty walking, and pain.   ACTIVITY LIMITATIONS: carrying, lifting, standing, squatting, transfers, and locomotion level  PARTICIPATION LIMITATIONS: meal prep, cleaning, laundry,  driving, shopping, community activity, and yard work  PERSONAL FACTORS: Age and 1-2 comorbidities: peripheral neuropathy   are also affecting patient's functional outcome.   REHAB POTENTIAL: Good  CLINICAL DECISION MAKING: Stable/uncomplicated  EVALUATION COMPLEXITY: Low   GOALS: Goals reviewed with patient? Yes  SHORT TERM GOALS: Target date: 11/12/2022  Patient will increase her Berg balance scale score by 5 points Baseline: Goal status: Achieved 2/21  2.  Patient will increase gross lower extremity strength by 5 pounds Baseline:  Goal status: No significant improvement in strength compared to previous strength assessment 2/21  3.  Patient will be independent with basic exercise and balance program Baseline:  Goal status: Independent with basic program 2/21   LONG TERM GOALS: Target date: 11/12/2022   Patient will walk in the community Baseline:  Goal status: Continues to feel unsafe 2/21  2.  Patient will go down the stairs with reciprocal gait without fear of falling Baseline:  Goal status: Continues to go up sideways 2/21  3.  Patient will be independent with complete balance and strengthening program Baseline:  Goal status: Working on developing final program 2/21  PLAN:  PT FREQUENCY: 1-2x/week  PT DURATION: 12 weeks  PLANNED INTERVENTIONS: Therapeutic exercises, Therapeutic activity, Neuromuscular re-education, Balance training, Gait training, Patient/Family education, Self Care, Joint mobilization, DME instructions, Aquatic Therapy, Cryotherapy, Moist heat, Taping, and Manual therapy  PLAN FOR NEXT SESSION: begin with base balance exercises and move to more advanced  balance activity   Carney Living, PT 10/01/2022, 6:10 AM

## 2022-10-01 ENCOUNTER — Encounter (HOSPITAL_BASED_OUTPATIENT_CLINIC_OR_DEPARTMENT_OTHER): Payer: Self-pay | Admitting: Physical Therapy

## 2022-10-08 ENCOUNTER — Telehealth: Payer: Self-pay | Admitting: Internal Medicine

## 2022-10-08 MED ORDER — METOPROLOL SUCCINATE ER 25 MG PO TB24: 12.5000 mg | ORAL_TABLET | Freq: Every day | ORAL | 3 refills | Status: DC

## 2022-10-08 NOTE — Telephone Encounter (Signed)
Pt stated she has a pacemaker that syncs with an app on her phone. Today she experienced an episode of dizziness while moving some boxes. During that time she received notification on her phone around 1122 and 1134 stating "transmission has been sent". Pt stated she still doesn't feel right however, she is no longer dizzy. Will forward to device clinic for advise, MD and nurse.

## 2022-10-08 NOTE — Telephone Encounter (Signed)
Patient is calling back for update. Please advise

## 2022-10-08 NOTE — Telephone Encounter (Signed)
STAT if patient feels like he/she is going to faint   Are you dizzy now? no  Do you feel faint or have you passed out? no  Do you have any other symptoms? Fear from her episode  Have you checked your HR and BP (record if available)? HR is 68  Patient states she had an episode of dizziness today and had to sit down. She says she called her daughter and opened the app on her phone for her pacemaker. She says the pacemaker says it last communicated at 11:22 am. She says she cannot remember if it does this routinely or during episodes. She says she is still sitting in her chair and cannot get up yet. She says she lives alone, but she did call her daughter. She says she has gotten up to get her phone. She says she is not sure if this is related to her pacemaker. She says she feels scared due to the episode and is not sure if she needs to go to the ED. She says she does not feel dizzy now, but then stated she feels like she will pass out if she stands up.

## 2022-10-08 NOTE — Telephone Encounter (Signed)
I got the transmission. I let her speak with Sonia Baller, rn.

## 2022-10-08 NOTE — Telephone Encounter (Signed)
Returned call to Pt.  Advised to continue amlodipine and to add the Toprol 12.5 mg one tablet PO daily.  She understands.

## 2022-10-08 NOTE — Telephone Encounter (Signed)
Transmission received from Pt. No episodes recorded from today. Presenting EGM below-Pt with frequent PVC's. Pt device function WNL.  PVC counter positive for increased PVC's over the last 49 days. Pt attests to recent increased stress related to family sickness and recent death of her husband. States she became dizzy this morning and was concerned.  Pulse OX reading showed HR less than 60 bpm.   Pt has previously been advised to take metoprolol succinate, but her PCP advised her not to.  Discussed taking Toprol to help suppress PVC's.  Advised this may help with transient dizziness. Pt agreeable.  Discussed with GT.  Ok to try low dose Toprol 12.5 mg po daily.  Pt thankful for call back.  Follow up appt made in July with Dr. Caryl Comes per Pt request.

## 2022-10-08 NOTE — Telephone Encounter (Signed)
Pt stated she was under the impression the amlodipine was going to be discontinued and replaced with metoprolol. However, it was not documented to discontinue amlodipine. She expressed concerns with taking both medications together. She was advised to take metoprolol in PM and amlodipine in AM. She will like to speak with Willeen Cass, RN for clarification.

## 2022-10-13 DIAGNOSIS — Z79899 Other long term (current) drug therapy: Secondary | ICD-10-CM | POA: Diagnosis not present

## 2022-10-13 DIAGNOSIS — M810 Age-related osteoporosis without current pathological fracture: Secondary | ICD-10-CM | POA: Diagnosis not present

## 2022-10-13 DIAGNOSIS — F325 Major depressive disorder, single episode, in full remission: Secondary | ICD-10-CM | POA: Diagnosis not present

## 2022-10-13 DIAGNOSIS — Z1331 Encounter for screening for depression: Secondary | ICD-10-CM | POA: Diagnosis not present

## 2022-10-13 DIAGNOSIS — F411 Generalized anxiety disorder: Secondary | ICD-10-CM | POA: Diagnosis not present

## 2022-10-13 DIAGNOSIS — E7800 Pure hypercholesterolemia, unspecified: Secondary | ICD-10-CM | POA: Diagnosis not present

## 2022-10-13 DIAGNOSIS — Z0000 Encounter for general adult medical examination without abnormal findings: Secondary | ICD-10-CM | POA: Diagnosis not present

## 2022-10-13 DIAGNOSIS — R269 Unspecified abnormalities of gait and mobility: Secondary | ICD-10-CM | POA: Diagnosis not present

## 2022-10-13 DIAGNOSIS — G5139 Clonic hemifacial spasm, unspecified: Secondary | ICD-10-CM | POA: Diagnosis not present

## 2022-10-13 DIAGNOSIS — I10 Essential (primary) hypertension: Secondary | ICD-10-CM | POA: Diagnosis not present

## 2022-10-13 DIAGNOSIS — Z950 Presence of cardiac pacemaker: Secondary | ICD-10-CM | POA: Diagnosis not present

## 2022-10-13 DIAGNOSIS — I480 Paroxysmal atrial fibrillation: Secondary | ICD-10-CM | POA: Diagnosis not present

## 2022-10-21 DIAGNOSIS — F4323 Adjustment disorder with mixed anxiety and depressed mood: Secondary | ICD-10-CM | POA: Diagnosis not present

## 2022-10-23 ENCOUNTER — Encounter (HOSPITAL_BASED_OUTPATIENT_CLINIC_OR_DEPARTMENT_OTHER): Payer: Self-pay | Admitting: Physical Therapy

## 2022-10-23 ENCOUNTER — Ambulatory Visit (HOSPITAL_BASED_OUTPATIENT_CLINIC_OR_DEPARTMENT_OTHER): Payer: Medicare Other | Attending: Internal Medicine | Admitting: Physical Therapy

## 2022-10-23 DIAGNOSIS — R2689 Other abnormalities of gait and mobility: Secondary | ICD-10-CM | POA: Insufficient documentation

## 2022-10-23 NOTE — Therapy (Signed)
OUTPATIENT PHYSICAL THERAPY LOWER EXTREMITY EVALUATION   Patient Name: Katherine Cardenas MRN: [id number] DOB:[birth year], 82 y.o., female Today's Date: 10/01/2022   PT End of Session - 09/30/22 1115     Visit Number 5    Number of Visits 16    Date for PT Re-Evaluation 11/25/22    PT Start Time 1103    PT Stop Time 1144    PT Time Calculation (min) 41 min    Activity Tolerance Patient tolerated treatment well    Behavior During Therapy [facility] for tasks assessed/performed              Past Medical History:  Diagnosis Date   Anxiety    Atrial fibrillation (HCC)    Atrial tachycardia    Depression    Facial spasm    Hypertension    Tachy-brady syndrome ([place])    a. s/p MDT His Bundle pacemaker implant 2018 - Dr Caryl Comes   Past Surgical History:  Procedure Laterality Date   APPENDECTOMY     age 21   CARDIAC CATHETERIZATION N/A 12/15/2014   Procedure: Left Heart Cath and Coronary Angiography;  Surgeon: Burnell Blanks, MD;  Location: [place] [facility];  Service: Cardiovascular;  Laterality: N/A;   CATARACT EXTRACTION     Right Eye   DILATION AND CURETTAGE OF UTERUS  1990's   benign polyp   PACEMAKER IMPLANT N/A 02/16/2017   Procedure: Pacemaker Implant;  Surgeon: Deboraha Sprang, MD;  Location: [place] [facility];  Service: Cardiovascular;  Laterality: N/A;   TONSILLECTOMY AND ADENOIDECTOMY     --age 36   Patient Active Problem List   Diagnosis Date Noted   Pacemaker 12/06/2020   Syncope 12/07/2016   Sinus node dysfunction (HCC) 12/07/2016   Paroxysmal atrial fibrillation ([place]) 12/07/2016   Hyperlipidemia 01/18/2016   Atrial tachycardia 02/13/2015   Essential hypertension 02/13/2015   Chronic coronary artery disease 12/15/2014    PCP: Lajean Manes MD  REFERRING PROVIDER: Rochele Raring MD  REFERRING DIAG:R26.9 (ICD-10-CM) - Unspecified abnormalities of gait and mobility   THERAPY DIAG:  Other abnormalities of gait and mobility  Rationale for Evaluation and  Treatment: Rehabilitation  ONSET DATE: has been progressively worse over the past few months  SUBJECTIVE:   SUBJECTIVE STATEMENT:   The patient continues reporting that overall she feels more off balance.  She reports she just has times when she really feels like she is going to fall.   PERTINENT HISTORY: Anxiety, Tachycardia, depression, facial spasm,    PAIN:  Are you having pain? Yes: NPRS scale: 0/10 Pain location: pain in left lateral ankle comes and goes  Pain description: aches Aggravating factors: just comes and goes  Relieving factors: rest  PRECAUTIONS: Pacemaker  WEIGHT BEARING RESTRICTIONS: No  FALLS:  Has patient fallen in last 6 months? No  LIVING ENVIRONMENT:   OCCUPATION: retired  Hobbies: liketto walk    PLOF: Independent  PATIENT GOALS:  To improve her balance   NEXT MD VISIT:   OBJECTIVE:   DIAGNOSTIC FINDINGS:    PATIENT SURVEYS:  FOTO  COGNITION: Overall cognitive status: Within functional limits for tasks assessed     SENSATION: Bilateral LE numbness  POSTURE: No Significant postural limitations  PALPATION: No significant tenderness to palpation   LOWER EXTREMITY ROM:  Passive ROM Right eval Left eval  Hip flexion    Hip extension    Hip abduction    Hip adduction    Hip internal rotation    Hip  external rotation    Knee flexion    Knee extension    Ankle dorsiflexion    Ankle plantarflexion    Ankle inversion    Ankle eversion     (Blank rows = not tested)  LOWER EXTREMITY MMT:  Full ROM    FUNCTIONAL TESTS:  Berg Balance Scale: 34  BERG Balance Test          Date:   Sit to Stand 4  Standing unsupported 4  Sitting with back unsupported but feet supported 4  Stand to sit  3  Transfers  3  Standing unsupported with eyes closed 4  Standing unsupported feet together 3  From standing position, reach forward with outstretched arm 3  From standing position, pick up object from floor 4  From standing  position, turn and look behind over each shoulder 3  Turn 360 3  Standing unsupported, alternately place foot on step 2  Standing unsupported, one foot in front 1  Standing on one leg 3  Total:  44    LOWER EXTREMITY MMT:    MMT Right eval Left eval  Hip flexion 22.7 19.4  Hip extension    Hip abduction 24.0 26.6  Hip adduction    Hip internal rotation    Hip external rotation    Knee flexion    Knee extension 28.4 21.7  Ankle dorsiflexion    Ankle plantarflexion    Ankle inversion    Ankle eversion     (Blank rows = not tested)   GAIT: Small stride length; decreased hip flexion. Uses cane   TODAY'S TREATMENT:                                                                                                                              DATE:  X3NFPYDD  3/14  Sit to stand without hands x10  Sit to stand without hands with eyes closed x20  Sit to stand on air-ex x10   Fwd and lateral hurdle walk 4 hurdles 5 laps   Hurdle weaving with min therapist assist 4 laps   Step onto air-ex x20 fwd and lateral   Box touches 2x20 each leg     2/21 Self-care: Reviewed different types of walkers.  Reviewed benefits of each.  Reviewed places where she may be able to test walkers such as screen through a medical supply.  Reviewed types of foot  bikes for home use.  Performed progress note.  Reviewed goals.  Reviewed FOTO  Performed Berg balance testing.  Reviewed plan of care and HEP going forward. 2/6  Standing:  Air-ex:  heel raise 2x15  Standing march 2x15   Step onto air-ex x15 each leg  Hurdle step lateral 2x15   Narrow base eyes open and closed 2x30 sec hold each   Sit to stand 3x5  Sti to transfer and back 3x5    1/23 Ex-bike: reviewed set up 5 min L1   Standing:  Air-ex:  heel raise 2x15  Standing march 2x15   Step onto air-ex x15 each leg  Hurdle step lateral 2x15   Narrow base eyes open and closed 2x30 sec hold each   Sit to stand 3x5  Sti  to transfer and back 3x5     12/18 Reviewed use of nu-step and where she can purchase an exercises bike  Reviewed thera-cane use  Row 2x15 green  Shoulder extension 2x15 green   Bilateral ER 2x15 red  Horizontal abduction 2x15 red  Shoulder flexion with band 2x15 red    PATIENT EDUCATION:  Education details: HEP, progression of balance activity  Person educated: Patient Education method: Explanation, Demonstration, Tactile cues, Verbal cues, and Handouts Education comprehension: verbalized understanding, returned demonstration, verbal cues required, tactile cues required, and needs further education  HOME EXERCISE PROGRAM: [id number]   ASSESSMENT:  CLINICAL IMPRESSION: The patient continues to do well with high level dynamic and static balance activity, yet she continues to report feeling more and more unstable for short bouts of time. She wants to continue working with therapy here but we suggested maybe she go to our [facility] clinic for a more comprehensive balance assessment. She will consider.   OBJECTIVE IMPAIRMENTS: Abnormal gait, decreased activity tolerance, decreased endurance, decreased mobility, difficulty walking, and pain.   ACTIVITY LIMITATIONS: carrying, lifting, standing, squatting, transfers, and locomotion level  PARTICIPATION LIMITATIONS: meal prep, cleaning, laundry, driving, shopping, community activity, and yard work  PERSONAL FACTORS: Age and 1-2 comorbidities: peripheral neuropathy   are also affecting patient's functional outcome.   REHAB POTENTIAL: Good  CLINICAL DECISION MAKING: Stable/uncomplicated  EVALUATION COMPLEXITY: Low   GOALS: Goals reviewed with patient? Yes  SHORT TERM GOALS: Target date: 11/12/2022  Patient will increase her Berg balance scale score by 5 points Baseline: Goal status: Achieved 2/21  2.  Patient will increase gross lower extremity strength by 5 pounds Baseline:  Goal status: No significant improvement in strength  compared to previous strength assessment 2/21  3.  Patient will be independent with basic exercise and balance program Baseline:  Goal status: Independent with basic program 2/21   LONG TERM GOALS: Target date: 11/12/2022   Patient will walk in the community Baseline:  Goal status: Continues to feel unsafe 2/21  2.  Patient will go down the stairs with reciprocal gait without fear of falling Baseline:  Goal status: Continues to go up sideways 2/21  3.  Patient will be independent with complete balance and strengthening program Baseline:  Goal status: Working on developing final program 2/21  PLAN:  PT FREQUENCY: 1-2x/week  PT DURATION: 12 weeks  PLANNED INTERVENTIONS: Therapeutic exercises, Therapeutic activity, Neuromuscular re-education, Balance training, Gait training, Patient/Family education, Self Care, Joint mobilization, DME instructions, Aquatic Therapy, Cryotherapy, Moist heat, Taping, and Manual therapy  PLAN FOR NEXT SESSION: begin with base balance exercises and move to more advanced balance activity   Carney Living, PT 10/01/2022, 6:10 AM

## 2022-10-27 DIAGNOSIS — F4323 Adjustment disorder with mixed anxiety and depressed mood: Secondary | ICD-10-CM | POA: Diagnosis not present

## 2022-11-04 ENCOUNTER — Ambulatory Visit (HOSPITAL_BASED_OUTPATIENT_CLINIC_OR_DEPARTMENT_OTHER): Payer: Medicare Other | Admitting: Physical Therapy

## 2022-11-04 ENCOUNTER — Encounter (HOSPITAL_BASED_OUTPATIENT_CLINIC_OR_DEPARTMENT_OTHER): Payer: Self-pay | Admitting: Physical Therapy

## 2022-11-04 DIAGNOSIS — R2689 Other abnormalities of gait and mobility: Secondary | ICD-10-CM

## 2022-11-04 NOTE — Therapy (Addendum)
OUTPATIENT PHYSICAL THERAPY LOWER EXTREMITY Treatment/discharge    Patient Name: Katherine Cardenas MRN: [id number] DOB:03-21-1941, 82 y.o., female Today's Date: 10/01/2022   PT End of Session - 09/30/22 1115     Visit Number 5    Number of Visits 16    Date for PT Re-Evaluation 11/25/22    PT Start Time 1103    PT Stop Time 1144    PT Time Calculation (min) 41 min    Activity Tolerance Patient tolerated treatment well    Behavior During Therapy [facility] for tasks assessed/performed              Past Medical History:  Diagnosis Date   Anxiety    Atrial fibrillation (HCC)    Atrial tachycardia    Depression    Facial spasm    Hypertension    Tachy-brady syndrome (HCC)    a. s/p MDT His Bundle pacemaker implant 2018 - Dr Graciela Husbands   Past Surgical History:  Procedure Laterality Date   APPENDECTOMY     age 72   CARDIAC CATHETERIZATION N/A 12/15/2014   Procedure: Left Heart Cath and Coronary Angiography;  Surgeon: Kathleene Hazel, MD;  Location: [facility] INVASIVE [facility];  Service: Cardiovascular;  Laterality: N/A;   CATARACT EXTRACTION     Right Eye   DILATION AND CURETTAGE OF UTERUS  1990's   benign polyp   PACEMAKER IMPLANT N/A 02/16/2017   Procedure: Pacemaker Implant;  Surgeon: Duke Salvia, MD;  Location: [facility] INVASIVE [facility] LAB;  Service: Cardiovascular;  Laterality: N/A;   TONSILLECTOMY AND ADENOIDECTOMY     --age 58   Patient Active Problem List   Diagnosis Date Noted   Pacemaker 12/06/2020   Syncope 12/07/2016   Sinus node dysfunction (HCC) 12/07/2016   Paroxysmal atrial fibrillation (HCC) 12/07/2016   Hyperlipidemia 01/18/2016   Atrial tachycardia 02/13/2015   Essential hypertension 02/13/2015   Chronic coronary artery disease 12/15/2014    PCP: Merlene Laughter MD  REFERRING PROVIDER: Hillard Danker MD  REFERRING DIAG:R26.9 (ICD-10-CM) - Unspecified abnormalities of gait and mobility   THERAPY DIAG:  Other abnormalities of gait and mobility  Rationale for  Evaluation and Treatment: Rehabilitation  ONSET DATE: has been progressively worse over the past few months  SUBJECTIVE:   SUBJECTIVE STATEMENT:   The patient comes in today reporting an acute onset of right shoulder pain. She thinks she has been holding her Ipad too much. It is effecting her ability to perform her exercises.  PERTINENT HISTORY: Anxiety, Tachycardia, depression, facial spasm,    PAIN:  Are you having pain? Yes: NPRS scale: 3/10 Pain location: right shoulder  Pain description: aches Aggravating factors: just comes and goes  Relieving factors: rest  PRECAUTIONS: Pacemaker  WEIGHT BEARING RESTRICTIONS: No  FALLS:  Has patient fallen in last 6 months? No  LIVING ENVIRONMENT:   OCCUPATION: retired  Hobbies: liketto walk    PLOF: Independent  PATIENT GOALS:  To improve her balance   NEXT MD VISIT:   OBJECTIVE:   DIAGNOSTIC FINDINGS:    PATIENT SURVEYS:  FOTO  COGNITION: Overall cognitive status: Within functional limits for tasks assessed     SENSATION: Bilateral LE numbness  POSTURE: No Significant postural limitations  PALPATION: No significant tenderness to palpation   LOWER EXTREMITY ROM:  Passive ROM Right eval Left eval  Hip flexion    Hip extension    Hip abduction    Hip adduction    Hip internal rotation    Hip external rotation  Knee flexion    Knee extension    Ankle dorsiflexion    Ankle plantarflexion    Ankle inversion    Ankle eversion     (Blank rows = not tested)  LOWER EXTREMITY MMT:  Full ROM    FUNCTIONAL TESTS:  Berg Balance Scale: 34  BERG Balance Test          Date:   Sit to Stand 4  Standing unsupported 4  Sitting with back unsupported but feet supported 4  Stand to sit  3  Transfers  3  Standing unsupported with eyes closed 4  Standing unsupported feet together 3  From standing position, reach forward with outstretched arm 3  From standing position, pick up object from floor 4  From  standing position, turn and look behind over each shoulder 3  Turn 360 3  Standing unsupported, alternately place foot on step 2  Standing unsupported, one foot in front 1  Standing on one leg 3  Total:  44    LOWER EXTREMITY MMT:    MMT Right eval Left eval  Hip flexion 22.7 19.4  Hip extension    Hip abduction 24.0 26.6  Hip adduction    Hip internal rotation    Hip external rotation    Knee flexion    Knee extension 28.4 21.7  Ankle dorsiflexion    Ankle plantarflexion    Ankle inversion    Ankle eversion     (Blank rows = not tested)   GAIT: Small stride length; decreased hip flexion. Uses cane   TODAY'S TREATMENT:                                                                                                                              DATE:  X3NFPYDD  3/26 Soft tissue mobilization right upper trap  Wand flexion in pain-free ranges 2 x 10  Row with red T band with cueing for core contraction 2 x 10 Shoulder extension with red T band cueing for core contraction 2 x 10 Both  previously given for HEP  Fwd and lateral hurdle walk 4 hurdles 5 laps  Step onto air-ex x20 fwd and lateral both contactless guard assist and gait belt   3/14  Sit to stand without hands x10  Sit to stand without hands with eyes closed x20  Sit to stand on air-ex x10   Fwd and lateral hurdle walk 4 hurdles 5 laps   Hurdle weaving with min therapist assist 4 laps   Step onto air-ex x20 fwd and lateral   Box touches 2x20 each leg     2/21 Self-care: Reviewed different types of walkers.  Reviewed benefits of each.  Reviewed places where she may be able to test walkers such as screen through a medical supply.  Reviewed types of foot  bikes for home use.  Performed progress note.  Reviewed goals.  Reviewed FOTO  Performed Berg balance testing.  Reviewed plan  of care and HEP going forward. 2/6  Standing:  Air-ex:  heel raise 2x15  Standing march 2x15   Step onto air-ex  x15 each leg  Hurdle step lateral 2x15   Narrow base eyes open and closed 2x30 sec hold each   Sit to stand 3x5  Sti to transfer and back 3x5    1/23 Ex-bike: reviewed set up 5 min L1   Standing:  Air-ex:  heel raise 2x15  Standing march 2x15   Step onto air-ex x15 each leg  Hurdle step lateral 2x15   Narrow base eyes open and closed 2x30 sec hold each   Sit to stand 3x5  Sti to transfer and back 3x5     12/18 Reviewed use of nu-step and where she can purchase an exercises bike  Reviewed thera-cane use  Row 2x15 green  Shoulder extension 2x15 green   Bilateral ER 2x15 red  Horizontal abduction 2x15 red  Shoulder flexion with band 2x15 red    PATIENT EDUCATION:  Education details: HEP, progression of balance activity  Person educated: Patient Education method: Explanation, Demonstration, Tactile cues, Verbal cues, and Handouts Education comprehension: verbalized understanding, returned demonstration, verbal cues required, tactile cues required, and needs further education  HOME EXERCISE PROGRAM: [id number]   ASSESSMENT:  CLINICAL IMPRESSION: Patient presented today with right shoulder pain with an acute onset.  She believes it is likely because she holds her iPad for long periods of time.  Therapy reviewed iPad stands that she can use in her bed in order to not hold her iPad.  Therapy performed soft tissue mobilization to her upper trap.  She reported improved pain following manual therapy.  She is educated in self soft tissue mobilization of the upper trap.  She continues to do well high-level dynamic balance exercises.  Her issue she describes as when things are coming at her it throws her balance off.  Therapy discussed with her that this could have a potential the VOR origin.  She was advised that our neuro clinic may be the best for evaluating this issue.  She reports she would like to get through this month before transferring clinics.  She was advised that she  should do this soon because if she is becoming more out of balance she is a high fall risk. OBJECTIVE IMPAIRMENTS: Abnormal gait, decreased activity tolerance, decreased endurance, decreased mobility, difficulty walking, and pain.   ACTIVITY LIMITATIONS: carrying, lifting, standing, squatting, transfers, and locomotion level  PARTICIPATION LIMITATIONS: meal prep, cleaning, laundry, driving, shopping, community activity, and yard work  PERSONAL FACTORS: Age and 1-2 comorbidities: peripheral neuropathy   are also affecting patient's functional outcome.   REHAB POTENTIAL: Good  CLINICAL DECISION MAKING: Stable/uncomplicated  EVALUATION COMPLEXITY: Low   GOALS: Goals reviewed with patient? Yes  SHORT TERM GOALS: Target date: 11/12/2022  Patient will increase her Berg balance scale score by 5 points Baseline: Goal status: Achieved 2/21  2.  Patient will increase gross lower extremity strength by 5 pounds Baseline:  Goal status: No significant improvement in strength compared to previous strength assessment 2/21  3.  Patient will be independent with basic exercise and balance program Baseline:  Goal status: Independent with basic program 2/21   LONG TERM GOALS: Target date: 11/12/2022   Patient will walk in the community Baseline:  Goal status: Continues to feel unsafe 2/21  2.  Patient will go down the stairs with reciprocal gait without fear of falling Baseline:  Goal status: Continues to go up sideways  2/21  3.  Patient will be independent with complete balance and strengthening program Baseline:  Goal status: Working on developing final program 2/21  PLAN:  PT FREQUENCY: 1-2x/week  PT DURATION: 12 weeks  PLANNED INTERVENTIONS: Therapeutic exercises, Therapeutic activity, Neuromuscular re-education, Balance training, Gait training, Patient/Family education, Self Care, Joint mobilization, DME instructions, Aquatic Therapy, Cryotherapy, Moist heat, Taping, and Manual  therapy  PLAN FOR NEXT SESSION: begin with base balance exercises and move to more advanced balance activity  PHYSICAL THERAPY DISCHARGE SUMMARY  Visits from Start of Care: 5  Current functional level related to goals / functional outcomes: Objective improvement but no significant subjective feeling of improvement    Remaining deficits: See above   Education / Equipment: HEP    Patient agrees to discharge. Patient goals were met. Patient is being discharged due to meeting the stated rehab goals.  Dessie Coma, PT 10/01/2022, 6:10 AM

## 2022-11-13 DIAGNOSIS — F4323 Adjustment disorder with mixed anxiety and depressed mood: Secondary | ICD-10-CM | POA: Diagnosis not present

## 2022-11-17 ENCOUNTER — Encounter (HOSPITAL_BASED_OUTPATIENT_CLINIC_OR_DEPARTMENT_OTHER): Payer: Medicare Other | Admitting: Physical Therapy

## 2022-11-18 ENCOUNTER — Ambulatory Visit (INDEPENDENT_AMBULATORY_CARE_PROVIDER_SITE_OTHER): Payer: Medicare Other

## 2022-11-18 DIAGNOSIS — F4323 Adjustment disorder with mixed anxiety and depressed mood: Secondary | ICD-10-CM | POA: Diagnosis not present

## 2022-11-18 DIAGNOSIS — I495 Sick sinus syndrome: Secondary | ICD-10-CM | POA: Diagnosis not present

## 2022-11-19 LAB — CUP PACEART REMOTE DEVICE CHECK
Battery Remaining Longevity: 97 mo
Battery Voltage: 2.98 V
Brady Statistic AP VP Percent: 1.1 %
Brady Statistic AP VS Percent: 93.49 %
Brady Statistic AS VP Percent: 0 %
Brady Statistic AS VS Percent: 5.41 %
Brady Statistic RA Percent Paced: 97.88 %
Brady Statistic RV Percent Paced: 1.31 %
Date Time Interrogation Session: 20240408230101
Implantable Lead Connection Status: 753985
Implantable Lead Connection Status: 753985
Implantable Lead Implant Date: 20180709
Implantable Lead Implant Date: 20180709
Implantable Lead Location: 753859
Implantable Lead Location: 753860
Implantable Lead Model: 3830
Implantable Lead Model: 5076
Implantable Pulse Generator Implant Date: 20180709
Lead Channel Impedance Value: 285 Ohm
Lead Channel Impedance Value: 323 Ohm
Lead Channel Impedance Value: 399 Ohm
Lead Channel Impedance Value: 456 Ohm
Lead Channel Pacing Threshold Amplitude: 0.625 V
Lead Channel Pacing Threshold Pulse Width: 0.4 ms
Lead Channel Sensing Intrinsic Amplitude: 1.375 mV
Lead Channel Sensing Intrinsic Amplitude: 1.375 mV
Lead Channel Sensing Intrinsic Amplitude: 8.125 mV
Lead Channel Sensing Intrinsic Amplitude: 8.125 mV
Lead Channel Setting Pacing Amplitude: 1.5 V
Lead Channel Setting Pacing Amplitude: 2.5 V
Lead Channel Setting Pacing Pulse Width: 1 ms
Lead Channel Setting Sensing Sensitivity: 2 mV
Zone Setting Status: 755011
Zone Setting Status: 755011

## 2022-11-25 DIAGNOSIS — F4323 Adjustment disorder with mixed anxiety and depressed mood: Secondary | ICD-10-CM | POA: Diagnosis not present

## 2022-12-03 DIAGNOSIS — F4323 Adjustment disorder with mixed anxiety and depressed mood: Secondary | ICD-10-CM | POA: Diagnosis not present

## 2022-12-12 ENCOUNTER — Encounter (HOSPITAL_BASED_OUTPATIENT_CLINIC_OR_DEPARTMENT_OTHER): Payer: Medicare Other | Admitting: Physical Therapy

## 2022-12-15 DIAGNOSIS — F4323 Adjustment disorder with mixed anxiety and depressed mood: Secondary | ICD-10-CM | POA: Diagnosis not present

## 2022-12-22 DIAGNOSIS — F4323 Adjustment disorder with mixed anxiety and depressed mood: Secondary | ICD-10-CM | POA: Diagnosis not present

## 2022-12-23 ENCOUNTER — Encounter (HOSPITAL_BASED_OUTPATIENT_CLINIC_OR_DEPARTMENT_OTHER): Payer: Medicare Other | Admitting: Physical Therapy

## 2022-12-23 DIAGNOSIS — F325 Major depressive disorder, single episode, in full remission: Secondary | ICD-10-CM | POA: Diagnosis not present

## 2022-12-23 DIAGNOSIS — R2689 Other abnormalities of gait and mobility: Secondary | ICD-10-CM | POA: Diagnosis not present

## 2022-12-23 DIAGNOSIS — F411 Generalized anxiety disorder: Secondary | ICD-10-CM | POA: Diagnosis not present

## 2022-12-23 DIAGNOSIS — G5139 Clonic hemifacial spasm, unspecified: Secondary | ICD-10-CM | POA: Diagnosis not present

## 2022-12-23 DIAGNOSIS — I10 Essential (primary) hypertension: Secondary | ICD-10-CM | POA: Diagnosis not present

## 2022-12-26 NOTE — Progress Notes (Signed)
Remote pacemaker transmission.   

## 2023-01-21 DIAGNOSIS — F4323 Adjustment disorder with mixed anxiety and depressed mood: Secondary | ICD-10-CM | POA: Diagnosis not present

## 2023-01-27 DIAGNOSIS — F4323 Adjustment disorder with mixed anxiety and depressed mood: Secondary | ICD-10-CM | POA: Diagnosis not present

## 2023-02-03 DIAGNOSIS — F4323 Adjustment disorder with mixed anxiety and depressed mood: Secondary | ICD-10-CM | POA: Diagnosis not present

## 2023-02-11 DIAGNOSIS — F4323 Adjustment disorder with mixed anxiety and depressed mood: Secondary | ICD-10-CM | POA: Diagnosis not present

## 2023-02-14 ENCOUNTER — Other Ambulatory Visit: Payer: Self-pay | Admitting: Internal Medicine

## 2023-02-17 ENCOUNTER — Ambulatory Visit (INDEPENDENT_AMBULATORY_CARE_PROVIDER_SITE_OTHER): Payer: Medicare Other

## 2023-02-17 ENCOUNTER — Encounter: Payer: Self-pay | Admitting: Internal Medicine

## 2023-02-17 ENCOUNTER — Ambulatory Visit: Payer: Medicare Other | Attending: Internal Medicine | Admitting: Internal Medicine

## 2023-02-17 VITALS — BP 122/76 | HR 74 | Ht 66.0 in | Wt 136.8 lb

## 2023-02-17 DIAGNOSIS — I495 Sick sinus syndrome: Secondary | ICD-10-CM

## 2023-02-17 DIAGNOSIS — I480 Paroxysmal atrial fibrillation: Secondary | ICD-10-CM

## 2023-02-17 DIAGNOSIS — F4323 Adjustment disorder with mixed anxiety and depressed mood: Secondary | ICD-10-CM | POA: Diagnosis not present

## 2023-02-17 LAB — CUP PACEART INCLINIC DEVICE CHECK
Battery Remaining Longevity: 91 mo
Battery Voltage: 2.98 V
Brady Statistic AP VP Percent: 0.68 %
Brady Statistic AP VS Percent: 89.48 %
Brady Statistic AS VP Percent: 0 %
Brady Statistic AS VS Percent: 9.83 %
Brady Statistic RA Percent Paced: 92.84 %
Brady Statistic RV Percent Paced: 1.32 %
Date Time Interrogation Session: 20240709171014
Implantable Lead Connection Status: 753985
Implantable Lead Connection Status: 753985
Implantable Lead Implant Date: 20180709
Implantable Lead Implant Date: 20180709
Implantable Lead Location: 753859
Implantable Lead Location: 753860
Implantable Lead Model: 3830
Implantable Lead Model: 5076
Implantable Pulse Generator Implant Date: 20180709
Lead Channel Impedance Value: 285 Ohm
Lead Channel Impedance Value: 361 Ohm
Lead Channel Impedance Value: 380 Ohm
Lead Channel Impedance Value: 494 Ohm
Lead Channel Pacing Threshold Amplitude: 0.75 V
Lead Channel Pacing Threshold Pulse Width: 0.4 ms
Lead Channel Sensing Intrinsic Amplitude: 1.625 mV
Lead Channel Sensing Intrinsic Amplitude: 1.625 mV
Lead Channel Sensing Intrinsic Amplitude: 10.375 mV
Lead Channel Sensing Intrinsic Amplitude: 12.375 mV
Lead Channel Setting Pacing Amplitude: 1.5 V
Lead Channel Setting Pacing Amplitude: 2.5 V
Lead Channel Setting Pacing Pulse Width: 1 ms
Lead Channel Setting Sensing Sensitivity: 2 mV
Zone Setting Status: 755011
Zone Setting Status: 755011

## 2023-02-17 MED ORDER — METOPROLOL SUCCINATE ER 25 MG PO TB24: 25.0000 mg | ORAL_TABLET | Freq: Every day | ORAL | 3 refills | Status: DC

## 2023-02-17 NOTE — Patient Instructions (Signed)
Medication Instructions:  Your physician has recommended you make the following change in your medication:  1) INCREASE Toprol (metoprolol succinate) to 25 mg by mouth daily  *If you need a refill on your cardiac medications before your next appointment, please call your pharmacy*  Follow-Up: At [facility], you and your health needs are our priority.  As part of our continuing mission to provide you with exceptional heart care, we have created designated Provider Care Teams.  These Care Teams include your primary Cardiologist (physician) and Advanced Practice Providers (APPs -  Physician Assistants and Nurse Practitioners) who all work together to provide you with the care you need, when you need it.  Your next appointment:   4 month(s)  Provider:   You may see Sherryl Manges, MD or one of the following Advanced Practice Providers on your designated Care Team:   Francis Dowse, [place] [address]" [place], [place] Sherie Don, NP Canary Brim, NP

## 2023-02-17 NOTE — Progress Notes (Signed)
Patient Care Team: Thana Ates, MD as PCP - General (Internal Medicine) Duke Salvia, MD as PCP - Electrophysiology (Cardiology)   HPI  Katherine Cardenas is a 82 y.o. female Seen in follow-up for a history of atrial arrhythmia and sinus node dysfunction.s/p Pacer Medtronic  7/18  The latter had prompted the discontinuation of diltiazem about 4 years ago. When she was in the hospital she was having bradycardia again and was elected to discontinue her propafenone. With that there was normalization of her heart rate.  She had been hospitalized 4/18 for syncope associated with bradycardia  Cardiac catheterization 5/16 demonstrated mild nonobstructive coronary disease  Echocardiogram was normal 4/18   Ongoing issues with SCAF, on aspirin noted coagulation.  Interval loss of her husband (2021) and her son Katherine Cardenas 4/24     The patient denies chest pain, shortness of breath, nocturnal dyspnea, orthopnea or peripheral edema.  There have been , lightheadedness or syncope.  Complains of some palpitations.  Much better on the metoprolol.[name]     DATE PR interval QRSduration Propafenone  5/18  140 92 0  3/19 160 96 150  10/19 Ap 148 96 150 (2x)  }5/22 Ap 200 106 150 (2.5X)        Date Cr K Hgb TC LDL  7/18    194 105  1/24  0.8 4.5 15.0        Thromboembolic risk factors ( age  -2, HTN-1, Gender-1) for a CHADSVASc Score of 4   Past Medical History:  Diagnosis Date   Anxiety    Atrial fibrillation (HCC)    Atrial tachycardia    Depression    Facial spasm    Hypertension    Tachy-brady syndrome (HCC)    a. s/p MDT His Bundle pacemaker implant 2018 - Dr Graciela Husbands    Past Surgical History:  Procedure Laterality Date   APPENDECTOMY     age 85   CARDIAC CATHETERIZATION N/A 12/15/2014   Procedure: Left Heart Cath and Coronary Angiography;  Surgeon: Kathleene Hazel, MD;  Location: [facility] INVASIVE CV LAB;  Service: Cardiovascular;  Laterality: N/A;   CATARACT EXTRACTION      Right Eye   DILATION AND CURETTAGE OF UTERUS  1990's   benign polyp   PACEMAKER IMPLANT N/A 02/16/2017   Procedure: Pacemaker Implant;  Surgeon: Duke Salvia, MD;  Location: [facility] INVASIVE CV LAB;  Service: Cardiovascular;  Laterality: N/A;   TONSILLECTOMY AND ADENOIDECTOMY     --age 40    Current Outpatient Medications  Medication Sig Dispense Refill   amLODipine (NORVASC) 2.5 MG tablet Take 1 tablet (2.5 mg total) by mouth daily. Please contact our office to schedule an appointment for future refills. [phone]. 1st attempt 90 tablet 3   Ascorbic Acid (VITAMIN C) 1000 MG tablet Take 500 mg by mouth daily before supper.      aspirin EC 81 MG tablet Take 81 mg by mouth daily.     cholecalciferol (VITAMIN D) 1000 UNITS tablet Take 1,000 Units by mouth at bedtime.      Coenzyme Q10 200 MG capsule Take 200 mg by mouth daily.     COVID-19 mRNA bivalent vaccine, Moderna, (MODERNA COVID-19 BIVAL BOOSTER) 50 MCG/0.5ML injection Inject into the muscle. 0.5 mL 0   COVID-19 mRNA Vac-TriS, Pfizer, (PFIZER-BIONT COVID-19 VAC-TRIS) SUSP injection Inject into the muscle. 0.3 mL 0   Cranberry 500 MG TABS Take 500 mg by mouth daily.     diltiazem (  CARDIZEM) 30 MG tablet TAKE 1 TABLET DAILY AS NEEDED. FOR HEART RATE>100. 90 tablet 3   influenza vaccine adjuvanted (FLUAD) 0.5 ML injection Inject into the muscle. 0.5 mL 0   LORazepam (ATIVAN) 0.5 MG tablet Take 0.25 mg by mouth 2 (two) times daily.     Magnesium 400 MG CAPS Take 400 mg by mouth daily.     metoprolol succinate (TOPROL XL) 25 MG 24 hr tablet Take 1 tablet (25 mg total) by mouth daily. 90 tablet 3   nitroGLYCERIN (NITROSTAT) 0.4 MG SL tablet Place 1 tablet (0.4 mg total) under the tongue every 5 (five) minutes x 3 doses as needed for chest pain. 25 tablet 3   Polyethyl Glycol-Propyl Glycol (SYSTANE OP) Place 1 drop into both eyes 2 (two) times daily.     Probiotic Product (PROBIOTIC COMPLEX ACIDOPHILUS) CAPS Take 1 tablet by mouth daily. "Enzyme  Probiotic Complex"     propafenone (RYTHMOL) 150 MG tablet TAKE 1 TABLET BY MOUTH EVERY 12 HOURS 180 tablet 1   Turmeric 500 MG CAPS Take 500 mg by mouth at bedtime.     vitamin B-12 (CYANOCOBALAMIN) 1000 MCG tablet Take 1,000 mcg by mouth daily.     No current facility-administered medications for this visit.    Allergies  Allergen Reactions   Augmentin [Amoxicillin-Pot Clavulanate] Hives   Ciprofloxacin Palpitations and Other (See Comments)    "irregular, rapid heart beat"   Diltiazem Other (See Comments)    Pulse dropped too low   Epinephrine Other (See Comments)    "NOVOCAIN"--could be the epinephrine----patient didn't tolerate-SYNCOPE     Novocain [Procaine] Other (See Comments)    could be the epinephrine----patient didn't tolerate-SYNCOPE   Avapro [Irbesartan] Swelling   Shrimp [Shellfish Allergy] Hives   Iohexol Itching and Rash     Code: RASH, Desc: patient states hx of possible reaction to ivp contrast years ago    Macrobid [Nitrofurantoin] Other (See Comments)    Burning skin   Penicillins Hives    Has patient had a PCN reaction causing immediate rash, facial/tongue/throat swelling, SOB or lightheadedness with hypotension: Yes Has patient had a PCN reaction causing severe rash involving mucus membranes or skin necrosis: No Has patient had a PCN reaction that required hospitalization No Has patient had a PCN reaction occurring within the last 10 years: No If all of the above answers are "NO", then may proceed with Cephalosporin use.   Zithromax [Azithromycin] Other (See Comments)    Cannot take with propafenone      Review of Systems negative except from HPI and PMH  Physical Exam BP 122/76   Pulse 74   Ht 5\' 6"  (1.676 m)   Wt 136 lb 12.8 oz (62.1 kg)   LMP 08/11/1988 (Approximate)   SpO2 95%   BMI 22.08 kg/m  Well developed and well nourished in no acute distress HENT normal Neck supple with JVP-flat Clear Device pocket well healed; without hematoma or  erythema.  There is no tethering  Regular rate and rhythm, no  gallop No  murmur Abd-soft with active BS No Clubbing cyanosis  edema Skin-warm and dry A & Oriented  Grossly normal sensory and motor function  ECG Atrial pacing at 74 Intervals 24/11/39 RSR prime   Device function is normal. Programming changes normla  See Paceart for details    Assessment and  Plan  Syncope  Atrial fibrillation/tachycardia  SCAF  Sinus bradycardia  Pacemaker   Medtronic  Hypertension  Hyperlipidemia  No interval syncope  Interval atrial fibrillation with some episodes as long as 21 hours.  Discussion regarding the limits of SCAF.  She thinks the increased burden may be related to the oriented stress that she has been under with the death of her son so we will continue on the metoprolol increasing the dose from 12.5--25 mg we will plan to reassess in about 4 months.  She is also advised to use her Apple Watch and will let us know if she has any ECG 2 days in a row suggesting atrial fibrillation  Blood pressure is well-controlled on the metoprolol and the amlodipine  Long discussion related to loss of husband and son       Current medicines are reviewed at length with the patient today .  The patient does not  have concerns regarding medicines.

## 2023-02-18 LAB — CUP PACEART REMOTE DEVICE CHECK
Battery Remaining Longevity: 91 mo
Battery Voltage: 2.98 V
Brady Statistic AP VP Percent: 0.38 %
Brady Statistic AP VS Percent: 96.16 %
Brady Statistic AS VP Percent: 0 %
Brady Statistic AS VS Percent: 3.46 %
Brady Statistic RA Percent Paced: 99.49 %
Brady Statistic RV Percent Paced: 0.38 %
Date Time Interrogation Session: 20240710125333
Implantable Lead Connection Status: 753985
Implantable Lead Connection Status: 753985
Implantable Lead Implant Date: 20180709
Implantable Lead Implant Date: 20180709
Implantable Lead Location: 753859
Implantable Lead Location: 753860
Implantable Lead Model: 3830
Implantable Lead Model: 5076
Implantable Pulse Generator Implant Date: 20180709
Lead Channel Impedance Value: 304 Ohm
Lead Channel Impedance Value: 323 Ohm
Lead Channel Impedance Value: 399 Ohm
Lead Channel Impedance Value: 456 Ohm
Lead Channel Pacing Threshold Amplitude: 0.75 V
Lead Channel Pacing Threshold Pulse Width: 0.4 ms
Lead Channel Sensing Intrinsic Amplitude: 10 mV
Lead Channel Sensing Intrinsic Amplitude: 10 mV
Lead Channel Sensing Intrinsic Amplitude: 2.25 mV
Lead Channel Sensing Intrinsic Amplitude: 2.25 mV
Lead Channel Setting Pacing Amplitude: 1.5 V
Lead Channel Setting Pacing Amplitude: 2.5 V
Lead Channel Setting Pacing Pulse Width: 1 ms
Lead Channel Setting Sensing Sensitivity: 2 mV
Zone Setting Status: 755011
Zone Setting Status: 755011

## 2023-02-23 DIAGNOSIS — F4323 Adjustment disorder with mixed anxiety and depressed mood: Secondary | ICD-10-CM | POA: Diagnosis not present

## 2023-03-02 ENCOUNTER — Telehealth: Payer: Self-pay

## 2023-03-02 DIAGNOSIS — F4323 Adjustment disorder with mixed anxiety and depressed mood: Secondary | ICD-10-CM | POA: Diagnosis not present

## 2023-03-02 NOTE — Telephone Encounter (Signed)
Alert received from [facility]:  Device alert for AF/AFL, controlled rates, in progress from 7/19 Hx of SCAF, no OAC per EPIC  Outreach made to Pt.  She would like a call back to discuss further.

## 2023-03-02 NOTE — Telephone Encounter (Signed)
Pt sent additional transmission.  Pt is in NSR.  Appears Pt had about 6 hours of atrial fibrillation on July 19-20, 2024.    Pt aware.  She states she did not increase Toprol at last office visit as advised d/t sister coming to visit.  She will increase Toprol going forward.  Will continue to monitor.

## 2023-03-09 DIAGNOSIS — F4323 Adjustment disorder with mixed anxiety and depressed mood: Secondary | ICD-10-CM | POA: Diagnosis not present

## 2023-03-11 ENCOUNTER — Ambulatory Visit: Payer: Medicare Other | Admitting: Nurse Practitioner

## 2023-03-12 NOTE — Progress Notes (Signed)
Remote pacemaker transmission.   

## 2023-03-17 DIAGNOSIS — F4323 Adjustment disorder with mixed anxiety and depressed mood: Secondary | ICD-10-CM | POA: Diagnosis not present

## 2023-03-22 ENCOUNTER — Other Ambulatory Visit: Payer: Self-pay

## 2023-03-22 DIAGNOSIS — Z7982 Long term (current) use of aspirin: Secondary | ICD-10-CM | POA: Diagnosis not present

## 2023-03-22 DIAGNOSIS — Z950 Presence of cardiac pacemaker: Secondary | ICD-10-CM | POA: Insufficient documentation

## 2023-03-22 DIAGNOSIS — T2000XA Burn of unspecified degree of head, face, and neck, unspecified site, initial encounter: Secondary | ICD-10-CM | POA: Diagnosis not present

## 2023-03-22 DIAGNOSIS — R269 Unspecified abnormalities of gait and mobility: Secondary | ICD-10-CM | POA: Insufficient documentation

## 2023-03-22 DIAGNOSIS — R519 Headache, unspecified: Secondary | ICD-10-CM | POA: Insufficient documentation

## 2023-03-22 DIAGNOSIS — E871 Hypo-osmolality and hyponatremia: Secondary | ICD-10-CM | POA: Insufficient documentation

## 2023-03-22 DIAGNOSIS — I10 Essential (primary) hypertension: Secondary | ICD-10-CM | POA: Insufficient documentation

## 2023-03-22 DIAGNOSIS — F419 Anxiety disorder, unspecified: Secondary | ICD-10-CM | POA: Insufficient documentation

## 2023-03-22 DIAGNOSIS — R260 Ataxic gait: Secondary | ICD-10-CM | POA: Diagnosis not present

## 2023-03-22 DIAGNOSIS — Z79899 Other long term (current) drug therapy: Secondary | ICD-10-CM | POA: Insufficient documentation

## 2023-03-22 LAB — BASIC METABOLIC PANEL
Anion gap: 8 (ref 5–15)
BUN: 18 mg/dL (ref 8–23)
CO2: 24 mmol/L (ref 22–32)
Calcium: 9.2 mg/dL (ref 8.9–10.3)
Chloride: 101 mmol/L (ref 98–111)
Creatinine, Ser: 0.8 mg/dL (ref 0.44–1.00)
GFR, Estimated: >60 mL/min (ref >60)
Glucose, Bld: 111 mg/dL — ABNORMAL HIGH (ref 70–99)
Potassium: 4.5 mmol/L (ref 3.5–5.1)
Sodium: 133 mmol/L — ABNORMAL LOW (ref 135–145)

## 2023-03-22 LAB — CBC
HCT: 46.3 % — ABNORMAL HIGH (ref 36.0–46.0)
Hemoglobin: 15.6 g/dL — ABNORMAL HIGH (ref 12.0–15.0)
MCH: 31 pg (ref 26.0–34.0)
MCHC: 33.7 g/dL (ref 30.0–36.0)
MCV: 92 fL (ref 80.0–100.0)
Platelets: 247 10*3/uL (ref 150–400)
RBC: 5.03 MIL/uL (ref 3.87–5.11)
RDW: 13.1 % (ref 11.5–15.5)
WBC: 7.9 10*3/uL (ref 4.0–10.5)
nRBC: 0 % (ref 0.0–0.2)

## 2023-03-22 LAB — MAGNESIUM: Magnesium: 2 mg/dL (ref 1.7–2.4)

## 2023-03-22 NOTE — ED Triage Notes (Signed)
Pt states that she is also concerned about theafib bc she "felt bad" and off balance. And her watch says she is in afib. She is on medication for afib

## 2023-03-22 NOTE — ED Triage Notes (Signed)
Pt has had a facial twitch that see was taking lorazepam for it. Pt has began to ween lorazepam dose. Tonight pt began having facial spasms and pain. Pt was told by dentist that she has a dental problem that needs to be monitored. Pain is on cheek near that area. Pt also has afib and wants to know if she is in afib. Denies CP SOB , or dizziness.

## 2023-03-23 ENCOUNTER — Emergency Department (HOSPITAL_BASED_OUTPATIENT_CLINIC_OR_DEPARTMENT_OTHER)
Admission: EM | Admit: 2023-03-23 | Discharge: 2023-03-23 | Disposition: A | Discharge status: Home / Self Care | Payer: Medicare Other | Attending: Emergency Medicine | Admitting: Emergency Medicine

## 2023-03-23 DIAGNOSIS — R2681 Unsteadiness on feet: Secondary | ICD-10-CM

## 2023-03-23 DIAGNOSIS — R4589 Other symptoms and signs involving emotional state: Secondary | ICD-10-CM

## 2023-03-23 DIAGNOSIS — R208 Other disturbances of skin sensation: Secondary | ICD-10-CM

## 2023-03-23 NOTE — ED Notes (Signed)
Pt. Not c/o pain at present, states she felt a"temperature change in her left face" and is worried about a possible infection in upper jaw, left side

## 2023-03-23 NOTE — ED Notes (Signed)
Pt. Wants to talk with MD prior to getting IV

## 2023-03-23 NOTE — Discharge Instructions (Addendum)
There is no evidence for odontogenic infection on exam.  Your symptoms have resolved.  Your orthostatic vitals were negative for orthostatic hypotension.  Your EKG showed a paced rhythm with a few PVCs present.  Your labs were indicative of potentially mild dehydration with an elevated hemoglobin of 15.6, recommend you continually orally rehydrate at home, follow-up with your PCP regarding your gait instability, chronic left-sided facial twitching.  Your neurologic exam was otherwise normal today.  Your calcium level was normal.

## 2023-03-23 NOTE — ED Provider Notes (Signed)
[place] EMERGENCY DEPARTMENT AT [facility] Provider Note   CSN: [id number] Arrival date & time: 03/22/23  2201     History  Chief Complaint  Patient presents with   Facial Pain    Katherine Cardenas is a 82 y.o. female.  HPI   82 year old female with medical history significant for anxiety, depression, atrial fibrillation with tachybradycardia syndrome and a permanent pacemaker in place (Medtronic in 2018) who presents to the emergency department with multiple complaints.  The patient states that earlier this evening around 6 or 7 PM she was bending over to get dishes out of the dishwasher when she became acutely unsteady on her feet.  She states that symptoms lasted around 2 minutes in total duration.  She denied any lightheadedness or near syncope.  No full syncope.  She denied any chest pain or shortness of breath at that time.  States that those symptoms have since resolved.  She occasionally gets unsteady on her feet has been working outpatient with physical therapy and does not feel like this is a new issue for her.  She denied any room spinning dizziness at this time.  No focal numbness, weakness, dysarthria or dysphagia.  Additionally, the patient stated that earlier tonight her watch showed that she was in atrial fibrillation.  She does not recall how fast her heart rate was going at that time.  She does have a pacemaker in place.  She additionally states that she felt a warmth to the left side of her face.  She was recently told by a dentist that she was missing a tooth and to watch out for evidence of dental infection.  No fevers, chills.  She endorsed a burning sensation of the left side of her face which is since resolved.  She chronically has a facial twitch for which she has chronically been taking lorazepam and this persistently has been bothering her.  She does not see neurology for this but has been managing her symptoms on her own and with her PCP.  She feels  that she may have been over anxious about her symptoms as the majority of them are chronic and/or intermittent and she has been more anxious about her health while living alone since the death of her husband who was a former neurologist 3 years ago.  She currently denies any symptoms other than her persistent facial twitching.   Home Medications Prior to Admission medications   Medication Sig Start Date End Date Taking? Authorizing Provider  amLODipine (NORVASC) 2.5 MG tablet Take 1 tablet (2.5 mg total) by mouth daily. Please contact our office to schedule an appointment for future refills. [phone]. 1st attempt 05/30/22   Duke Salvia, MD  Ascorbic Acid (VITAMIN C) 1000 MG tablet Take 500 mg by mouth daily before supper.     [provider]  aspirin EC 81 MG tablet Take 81 mg by mouth daily.    [provider]  cholecalciferol (VITAMIN D) 1000 UNITS tablet Take 1,000 Units by mouth at bedtime.     [provider]  Coenzyme Q10 200 MG capsule Take 200 mg by mouth daily.    [provider]  COVID-19 mRNA bivalent vaccine, Moderna, (MODERNA COVID-19 BIVAL BOOSTER) 50 MCG/0.5ML injection Inject into the muscle. 07/22/21   Judyann Munson, MD  COVID-19 mRNA Vac-TriS, Pfizer, (PFIZER-BIONT COVID-19 VAC-TRIS) SUSP injection Inject into the muscle. 02/04/21   Judyann Munson, MD  Cranberry 500 MG TABS Take 500 mg by mouth daily.  [provider]  diltiazem (CARDIZEM) 30 MG tablet TAKE 1 TABLET DAILY AS NEEDED. FOR HEART RATE>100. 10/17/20   Duke Salvia, MD  influenza vaccine adjuvanted (FLUAD) 0.5 ML injection Inject into the muscle. 09/02/22   Judyann Munson, MD  LORazepam (ATIVAN) 0.5 MG tablet Take 0.25 mg by mouth 2 (two) times daily.    [provider]  Magnesium 400 MG CAPS Take 400 mg by mouth daily.    [provider]  metoprolol succinate (TOPROL XL) 25 MG 24 hr tablet Take 1 tablet (25 mg total) by mouth daily. 02/17/23    Duke Salvia, MD  nitroGLYCERIN (NITROSTAT) 0.4 MG SL tablet Place 1 tablet (0.4 mg total) under the tongue every 5 (five) minutes x 3 doses as needed for chest pain. 05/25/18   Duke Salvia, MD  Polyethyl Glycol-Propyl Glycol (SYSTANE OP) Place 1 drop into both eyes 2 (two) times daily.    [provider]  Probiotic Product (PROBIOTIC COMPLEX ACIDOPHILUS) CAPS Take 1 tablet by mouth daily. "Enzyme Probiotic Complex"    [provider]  propafenone (RYTHMOL) 150 MG tablet TAKE 1 TABLET BY MOUTH EVERY 12 HOURS 02/16/23   Duke Salvia, MD  Turmeric 500 MG CAPS Take 500 mg by mouth at bedtime.    [provider]  vitamin B-12 (CYANOCOBALAMIN) 1000 MCG tablet Take 1,000 mcg by mouth daily.    [provider]      Allergies    Augmentin [amoxicillin-pot clavulanate], Ciprofloxacin, Diltiazem, Epinephrine, Novocain [procaine], Avapro [irbesartan], Shrimp [shellfish allergy], Iohexol, Macrobid [nitrofurantoin], Penicillins, and Zithromax [azithromycin]    Review of Systems   Review of Systems  All other systems reviewed and are negative.   Physical Exam Updated Vital Signs BP (!) 163/88   Pulse 83   Temp 97.8 F (36.6 C)   Resp 18   Ht 5\' 6"  (1.676 m)   LMP 08/11/1988 (Approximate)   SpO2 98%   BMI 22.08 kg/m  Physical Exam Vitals and nursing note reviewed.  Constitutional:      General: She is not in acute distress.    Appearance: She is well-developed.  HENT:     Head: Normocephalic and atraumatic.  Eyes:     Conjunctiva/sclera: Conjunctivae normal.  Cardiovascular:     Rate and Rhythm: Normal rate and regular rhythm.  Pulmonary:     Effort: Pulmonary effort is normal. No respiratory distress.  Abdominal:     Palpations: Abdomen is soft.     Tenderness: There is no abdominal tenderness.  Musculoskeletal:        General: No swelling.     Cervical back: Neck supple.  Skin:    General: Skin is warm and dry.     Capillary Refill:  Capillary refill takes less than 2 seconds.  Neurological:     Mental Status: She is alert.     Comments: MENTAL STATUS EXAM:    Orientation: Alert and oriented Memory: Cooperative, follows commands well.  Language: Speech is clear and language is normal.   CRANIAL NERVES:    CN 2 (Optic): Visual fields intact to confrontation.  CN 3,4,6 (EOM): Pupils equal and reactive to light. Full extraocular eye movement without nystagmus. Slight left sided ptosis present. CN 5 (Trigeminal): Facial sensation is normal, no weakness of masticatory muscles.  CN 7 (Facial): No facial weakness or asymmetry. Persistent facial twitching present on the left. CN 8 (Auditory): Auditory acuity grossly normal.  CN 9,10 (Glossophar): The uvula is midline, the  palate elevates symmetrically.  CN 11 (spinal access): Normal sternocleidomastoid and trapezius strength.  CN 12 (Hypoglossal): The tongue is midline. No atrophy or fasciculations.[name]   MOTOR:  Muscle Strength: 5/5RUE, 5/5LUE, 5/5RLE, 5/5LLE.   COORDINATION:   Intact finger-to-nose, no tremor.   SENSATION:   Intact to light touch all four extremities.     Psychiatric:        Mood and Affect: Mood normal.     ED Results / Procedures / Treatments   Labs (all labs ordered are listed, but only abnormal results are displayed) Labs Reviewed  BASIC METABOLIC PANEL - Abnormal; Notable for the following components:      Result Value   Sodium 133 (*)    Glucose, Bld 111 (*)    All other components within normal limits  CBC - Abnormal; Notable for the following components:   Hemoglobin 15.6 (*)    HCT 46.3 (*)    All other components within normal limits  MAGNESIUM    EKG None  Radiology No results found.  Procedures Procedures    Medications Ordered in ED Medications - No data to display  ED Course/ Medical Decision Making/ A&P                                 Medical Decision Making Amount and/or Complexity of Data Reviewed Labs:  ordered.   82 year old female with medical history significant for anxiety, depression, atrial fibrillation with tachybradycardia syndrome and a permanent pacemaker in place (Medtronic in 2018) who presents to the emergency department with multiple complaints.  The patient states that earlier this evening around 6 or 7 PM she was bending over to get dishes out of the dishwasher when she became acutely unsteady on her feet.  She states that symptoms lasted around 2 minutes in total duration.  She denied any lightheadedness or near syncope.  No full syncope.  She denied any chest pain or shortness of breath at that time.  States that those symptoms have since resolved.  She occasionally gets unsteady on her feet has been working outpatient with physical therapy and does not feel like this is a new issue for her.  She denied any room spinning dizziness at this time.  No focal numbness, weakness, dysarthria or dysphagia.  Additionally, the patient stated that earlier tonight her watch showed that she was in atrial fibrillation.  She does not recall how fast her heart rate was going at that time.  She does have a pacemaker in place.  She additionally states that she felt a warmth to the left side of her face.  She was recently told by a dentist that she was missing a tooth and to watch out for evidence of dental infection.  No fevers, chills.  She endorsed a burning sensation of the left side of her face which is since resolved.  She chronically has a facial twitch for which she has chronically been taking lorazepam and this persistently has been bothering her.  She does not see neurology for this but has been managing her symptoms on her own and with her PCP.  She feels that she may have been over anxious about her symptoms as the majority of them are chronic and/or intermittent and she has been more anxious about her health while living alone since the death of her husband who was a former neurologist 3 years  ago.  She currently denies any symptoms  other than her persistent facial twitching.   On arrival, the patient was afebrile, not tachycardic or tachypneic, mildly hypertensive BP 160/87, saturating 98% on room air.  Physical exam significant for left-sided ptosis and left facial twitching which is at baseline for the patient.  No new facial droop, no focal numbness or weakness, no dysarthria or dysphagia.  The patient is alert and oriented.  She denies any acute complaints at this time.  She had a brief episode of unsteadiness after bending over to get dishes out of the dishwasher.  Differential diagnosis includes orthostatic hypotension, cardiac arrhythmia, vasovagal episode.  Lower concern for acute CVA or TIA.   Initial EKG revealed paced rhythm, PVCs present, rate 77, no acute ischemic changes.   Laboratory evaluation collected in triage revealed magnesium normal, BMP generally unremarkable, mild hyponatremia 133, CBC with evidence of hemoconcentration with a hemoglobin of 15.6, no acute anemia, no leukocytosis.  The patient is neurologically intact.  Her facial burning has resolved and she has no evidence of odontogenic infection on exam. Orthostatic vitals were obtained: unremarkable.   Patient presenting with acute on chronic symptoms as described above.  Low concern for acute abnormality at this time.  Low concern for TIA or CVA.  Patient has chronic intermittent gait instability.  Orthostatic vitals were negative.  Her EKG revealed a paced rhythm.  Her pacemaker was just interrogated by cardiology in clinic 1 month ago and has a 7-year battery life left.  The patient declines further workup after discussion and would prefer to follow-up with her PCP.    Final Clinical Impression(s) / ED Diagnoses Final diagnoses:  Facial burning  Anxiety about health  Gait instability    Rx / DC Orders ED Discharge Orders     None         Ernie Avena, MD 03/23/23 [phone]

## 2023-03-24 DIAGNOSIS — F4323 Adjustment disorder with mixed anxiety and depressed mood: Secondary | ICD-10-CM | POA: Diagnosis not present

## 2023-03-30 DIAGNOSIS — F4323 Adjustment disorder with mixed anxiety and depressed mood: Secondary | ICD-10-CM | POA: Diagnosis not present

## 2023-04-06 DIAGNOSIS — F4323 Adjustment disorder with mixed anxiety and depressed mood: Secondary | ICD-10-CM | POA: Diagnosis not present

## 2023-04-13 DIAGNOSIS — F4323 Adjustment disorder with mixed anxiety and depressed mood: Secondary | ICD-10-CM | POA: Diagnosis not present

## 2023-04-21 DIAGNOSIS — F4323 Adjustment disorder with mixed anxiety and depressed mood: Secondary | ICD-10-CM | POA: Diagnosis not present

## 2023-04-27 DIAGNOSIS — F4323 Adjustment disorder with mixed anxiety and depressed mood: Secondary | ICD-10-CM | POA: Diagnosis not present

## 2023-04-28 DIAGNOSIS — I10 Essential (primary) hypertension: Secondary | ICD-10-CM | POA: Diagnosis not present

## 2023-04-28 DIAGNOSIS — F411 Generalized anxiety disorder: Secondary | ICD-10-CM | POA: Diagnosis not present

## 2023-04-28 DIAGNOSIS — R2689 Other abnormalities of gait and mobility: Secondary | ICD-10-CM | POA: Diagnosis not present

## 2023-04-28 DIAGNOSIS — G5139 Clonic hemifacial spasm, unspecified: Secondary | ICD-10-CM | POA: Diagnosis not present

## 2023-04-28 DIAGNOSIS — F325 Major depressive disorder, single episode, in full remission: Secondary | ICD-10-CM | POA: Diagnosis not present

## 2023-05-04 DIAGNOSIS — F4323 Adjustment disorder with mixed anxiety and depressed mood: Secondary | ICD-10-CM | POA: Diagnosis not present

## 2023-05-07 ENCOUNTER — Telehealth: Payer: Self-pay

## 2023-05-07 NOTE — Telephone Encounter (Signed)
Alert received from [facility]:  Patient initiated Ongoing AF, good ventricular rate control, no OAC has history of SCAF, increase AF burden is 29.1% of the time, sent to triage Next remote 05/19/2023

## 2023-05-07 NOTE — Telephone Encounter (Signed)
Spoke with patient.  She sent transmission today because was having issues with her remote monitor. She denies having any symptoms and is surprised she is having more episodes of AF.  Longest single episode was 95 hours beginning on 04/20/23.  She is aware but cannot identify any change in diet, health or meds other than life stress.  We discussed at length her life events as well as risks associated with untreated, longer episodes of AF.  She is still adamant that she does not want to start an OAC.  States she is more concerned about her fall risk when weighing risks versus benefits.  She has requested to only see Dr. Graciela Husbands in the future as she expressed concerns with how she was treated at a recent office visit with the APs regarding her decision to decline OAC therapy.  Will have the manager reach out and follow up with patient if needed.  Patient has an appointment with Dr. Graciela Husbands on 06/17/23 for follow up.   Will continue to monitor transmissions, patient given ER precautions for stroke symptoms.  She has device clinic number if any questions or concerns in the meantime.

## 2023-05-08 ENCOUNTER — Telehealth: Payer: Self-pay | Admitting: Internal Medicine

## 2023-05-08 NOTE — Telephone Encounter (Signed)
Pt calling in asking to speak with nurse about her medications. She was not sure if she should speak to device again or Dr. Graciela Husbands nurse. Informed her I would transfer to both.

## 2023-05-08 NOTE — Telephone Encounter (Signed)
Attempted phone call to pt and left voicemail to contact RN at [phone]. 

## 2023-05-11 DIAGNOSIS — F4323 Adjustment disorder with mixed anxiety and depressed mood: Secondary | ICD-10-CM | POA: Diagnosis not present

## 2023-05-13 NOTE — Telephone Encounter (Signed)
S/w pt to get her appt on 11/6 rescheduled to 10/7 at 8:45. She is glad to get in sooner. While on the phone she did state, that with her sons history of AF and how her episodes have gotten - she is ok with going ahead and starting eliquis and would like to know if they could be started now as Monday is a long ways out for her. She would like a c/b to discuss.  Katherine Cardenas

## 2023-05-13 NOTE — Telephone Encounter (Signed)
Noted  Katherine Cardenas  is there a way to move up her appt Thanks SK

## 2023-05-14 MED ORDER — APIXABAN 2.5 MG PO TABS: 2.5000 mg | ORAL_TABLET | Freq: Two times a day (BID) | ORAL | 1 refills | Status: DC

## 2023-05-14 NOTE — Telephone Encounter (Signed)
Pt has been scheduled an appointment 05/18/2023 to discuss OAC.

## 2023-05-14 NOTE — Telephone Encounter (Signed)
If she is agreeable, we will begin her on apixaban 2.5 mg twice daily.  Dosed for age and weight at 61 kg ( dressed fully)

## 2023-05-14 NOTE — Telephone Encounter (Signed)
Spoke with Katherine Cardenas and advised per Dr Graciela Husbands Katherine Cardenas may start Eliquis 2.5mg  - 1 tablet by mouth twice daily.  Katherine Cardenas verbalizes understanding and is agreeable to start medication. Katherine Cardenas is scheduled to see Dr Graciela Husbands on 03/18/2023.

## 2023-05-18 ENCOUNTER — Ambulatory Visit: Payer: Medicare Other | Attending: Internal Medicine | Admitting: Internal Medicine

## 2023-05-18 ENCOUNTER — Encounter: Payer: Self-pay | Admitting: Internal Medicine

## 2023-05-18 VITALS — BP 144/92 | HR 72 | Ht 66.0 in | Wt 137.8 lb

## 2023-05-18 DIAGNOSIS — I495 Sick sinus syndrome: Secondary | ICD-10-CM | POA: Diagnosis not present

## 2023-05-18 DIAGNOSIS — Z950 Presence of cardiac pacemaker: Secondary | ICD-10-CM | POA: Diagnosis not present

## 2023-05-18 DIAGNOSIS — I4719 Other supraventricular tachycardia: Secondary | ICD-10-CM | POA: Diagnosis not present

## 2023-05-18 DIAGNOSIS — F4323 Adjustment disorder with mixed anxiety and depressed mood: Secondary | ICD-10-CM | POA: Diagnosis not present

## 2023-05-18 DIAGNOSIS — I480 Paroxysmal atrial fibrillation: Secondary | ICD-10-CM | POA: Diagnosis not present

## 2023-05-18 LAB — CUP PACEART INCLINIC DEVICE CHECK
Battery Remaining Longevity: 67 mo
Battery Voltage: 2.96 V
Brady Statistic AP VP Percent: 1.29 %
Brady Statistic AP VS Percent: 95.28 %
Brady Statistic AS VP Percent: 0.02 %
Brady Statistic AS VS Percent: 3.33 %
Brady Statistic RA Percent Paced: 72.52 %
Brady Statistic RV Percent Paced: 22.46 %
Date Time Interrogation Session: 20241007165437
Implantable Lead Connection Status: 753985
Implantable Lead Connection Status: 753985
Implantable Lead Implant Date: 20180709
Implantable Lead Implant Date: 20180709
Implantable Lead Location: 753859
Implantable Lead Location: 753860
Implantable Lead Model: 3830
Implantable Lead Model: 5076
Implantable Pulse Generator Implant Date: 20180709
Lead Channel Impedance Value: 285 Ohm
Lead Channel Impedance Value: 342 Ohm
Lead Channel Impedance Value: 399 Ohm
Lead Channel Impedance Value: 494 Ohm
Lead Channel Pacing Threshold Amplitude: 0.5 V
Lead Channel Pacing Threshold Amplitude: 0.75 V
Lead Channel Pacing Threshold Pulse Width: 0.4 ms
Lead Channel Pacing Threshold Pulse Width: 1 ms
Lead Channel Sensing Intrinsic Amplitude: 0.375 mV
Lead Channel Sensing Intrinsic Amplitude: 0.75 mV
Lead Channel Sensing Intrinsic Amplitude: 10.75 mV
Lead Channel Sensing Intrinsic Amplitude: 13.5 mV
Lead Channel Setting Pacing Amplitude: 1.5 V
Lead Channel Setting Pacing Amplitude: 2.5 V
Lead Channel Setting Pacing Pulse Width: 1 ms
Lead Channel Setting Sensing Sensitivity: 2 mV
Zone Setting Status: 755011
Zone Setting Status: 755011

## 2023-05-18 MED ORDER — PROPAFENONE HCL 150 MG PO TABS: 150.0000 mg | ORAL_TABLET | Freq: Three times a day (TID) | ORAL | 3 refills | Status: DC

## 2023-05-18 MED ORDER — PROPAFENONE HCL ER 225 MG PO CP12: 225.0000 mg | ORAL_CAPSULE | Freq: Two times a day (BID) | ORAL | 3 refills | Status: DC

## 2023-05-18 NOTE — Patient Instructions (Signed)
Medication Instructions:  Your physician has recommended you make the following change in your medication:   ** Stop Aspirin  ** Start Propafenone 150mg  - 1 tablet by mouth every 8 hours.  *If you need a refill on your cardiac medications before your next appointment, please call your pharmacy*   Lab Work: None ordered.  If you have labs (blood work) drawn today and your tests are completely normal, you will receive your results only by: MyChart Message (if you have MyChart) OR A paper copy in the mail If you have any lab test that is abnormal or we need to change your treatment, we will call you to review the results.   Testing/Procedures: Your physician has recommended that you have a Cardioversion (DCCV). Electrical Cardioversion uses a jolt of electricity to your heart either through paddles or wired patches attached to your chest. This is a controlled, usually prescheduled, procedure. Defibrillation is done under light anesthesia in the hospital, and you usually go home the day of the procedure. This is done to get your heart back into a normal rhythm. You are not awake for the procedure. Please see the instruction sheet given to you today.    Follow-Up: At [facility], you and your health needs are our priority.  As part of our continuing mission to provide you with exceptional heart care, we have created designated Provider Care Teams.  These Care Teams include your primary Cardiologist (physician) and Advanced Practice Providers (APPs -  Physician Assistants and Nurse Practitioners) who all work together to provide you with the care you need, when you need it.  We recommend signing up for the patient portal called "MyChart".  Sign up information is provided on this After Visit Summary.  MyChart is used to connect with patients for Virtual Visits (Telemedicine).  Patients are able to view lab/test results, encounter notes, upcoming appointments, etc.  Non-urgent messages  can be sent to your provider as well.   To learn more about what you can do with MyChart, go to [email or web address].    Your next appointment:   To be schedled  Other Instructions    Dear Katherine Cardenas  You are scheduled for a Cardioversion on Wednesday, November 6 with Dr. Anne Fu.  Please arrive at the [facility] (Main Entrance A) at [facility]: [address] [place], [place] [address] at 8am(This time is 1.5 hour(s) before your procedure to ensure your preparation). Free valet parking service is available. You will check in at ADMITTING. The support person will be asked to wait in the waiting room.  It is OK to have someone drop you off and come back when you are ready to be discharged.     DIET:  Nothing to eat or drink after midnight except a sip of water with medications (see medication instructions below)  MEDICATION INSTRUCTIONS:  Continue your Eliquis without missing any doses. !!IF ANY NEW MEDICATIONS ARE STARTED AFTER TODAY, PLEASE NOTIFY YOUR PROVIDER AS SOON AS POSSIBLE!!  FYI: Medications such as Semaglutide (Ozempic, [place]), Tirzepatide (Mounjaro, Zepbound), Dulaglutide (Trulicity), etc ("GLP1 agonists") AND Canagliflozin (Invokana), Dapagliflozin (Farxiga), Empagliflozin (Jardiance), Ertugliflozin (Steglatro), Bexagliflozin [employer]) or any combination with one of these drugs such as Invokamet (Canagliflozin/Metformin), Synjardy (Empagliflozin/Metformin), etc ("SGLT2 inhibitors") must be held around the time of a procedure. This is not a comprehensive list of all of these drugs. Please review all of your medications and talk to your provider if you take any one of these. If you are  not sure, ask your provider.  }Continue taking your anticoagulant (blood thinner): Apixaban (Eliquis).  You will need to continue this after your procedure until you are told by your provider that it is safe to stop.    LABS: CBC and BMET at the hospital Your labs will be done at  the hospital prior to your procedure - you will need to arrive 1 and 1/2 hours prior to your procedure.  FYI:  For your safety, and to allow [place] to monitor your vital signs accurately during the surgery/procedure we request: If you have artificial nails, gel coating, SNS etc, please have those removed prior to your surgery/procedure. Not having the nail coverings /polish removed may result in cancellation or delay of your surgery/procedure.  You must have a responsible person to drive you home and stay in the waiting area during your procedure. Failure to do so could result in cancellation.  Bring your insurance cards.  *Special Note: Every effort is made to have your procedure done on time. Occasionally there are emergencies that occur at the hospital that may cause delays. Please be patient if a delay does occur.

## 2023-05-18 NOTE — Progress Notes (Signed)
Patient Care Team: Thana Ates, MD as PCP - General (Internal Medicine) Duke Salvia, MD as PCP - Electrophysiology (Cardiology)   HPI  Katherine Cardenas is a 82 y.o. female Seen in follow-up for a history of atrial arrhythmia and sinus node dysfunction s/p Pacer Medtronic  7/18 having presented with syncope.  On propafenone and metoprolol for atrial tach/palpitations  Previously issues with SCAF now with prolonged ( >96h) detected Interval loss of her husband (2021) and her son Gerlene Cardenas 4/24   The patient denies chest pain, nocturnal dyspnea, orthopnea or peripheral edema.  There have been no palpitations, lightheadedness or syncope.  Complains of some shortness of breath and lots of stress. Katherine Cardenas   DATE TEST EF   5/16 LHC  No obstructive CAD  4/18 Echo  60-65 % Mild LVH              DATE PR interval QRSduration Propafenone  5/18  140 92 0  3/19 160 96 150  10/19 Ap 148 96 150 (2x)   5/22 Ap 200 106 150 (2.5X)  7/24      Date Cr K Hgb TC LDL  7/18    194 105  1/24  0.8 4.5 15.0    8/24 0.8 4.5 15.6        Thromboembolic risk factors ( age  -2, HTN-1, Gender-1) for a CHADSVASc Score of 4   Past Medical History:  Diagnosis Date   Anxiety    Atrial fibrillation (HCC)    Atrial tachycardia (HCC)    Depression    Facial spasm    Hypertension    Tachy-brady syndrome (HCC)    a. s/p MDT His Bundle pacemaker implant 2018 - Dr Graciela Husbands    Past Surgical History:  Procedure Laterality Date   APPENDECTOMY     age 20   CARDIAC CATHETERIZATION N/A 12/15/2014   Procedure: Left Heart Cath and Coronary Angiography;  Surgeon: Kathleene Hazel, MD;  Location: [facility] INVASIVE CV LAB;  Service: Cardiovascular;  Laterality: N/A;   CATARACT EXTRACTION     Right Eye   DILATION AND CURETTAGE OF UTERUS  1990's   benign polyp   PACEMAKER IMPLANT N/A 02/16/2017   Procedure: Pacemaker Implant;  Surgeon: Duke Salvia, MD;  Location: [facility] INVASIVE CV LAB;  Service: Cardiovascular;   Laterality: N/A;   TONSILLECTOMY AND ADENOIDECTOMY     --age 10    Current Outpatient Medications  Medication Sig Dispense Refill   amLODipine (NORVASC) 2.5 MG tablet Take 1 tablet (2.5 mg total) by mouth daily. Please contact our office to schedule an appointment for future refills. [phone]. 1st attempt 90 tablet 3   apixaban (ELIQUIS) 2.5 MG TABS tablet Take 1 tablet (2.5 mg total) by mouth 2 (two) times daily. 180 tablet 1   Ascorbic Acid (VITAMIN C) 1000 MG tablet Take 500 mg by mouth daily before supper.      cholecalciferol (VITAMIN D) 1000 UNITS tablet Take 1,000 Units by mouth at bedtime.      Coenzyme Q10 200 MG capsule Take 200 mg by mouth daily.     Cranberry 500 MG TABS Take 500 mg by mouth daily.     LORazepam (ATIVAN) 0.5 MG tablet Take 0.25 mg by mouth daily.     Magnesium 400 MG CAPS Take 400 mg by mouth daily.     metoprolol succinate (TOPROL XL) 25 MG 24 hr tablet Take 1 tablet (25 mg total) by mouth daily. 90  tablet 3   Polyethyl Glycol-Propyl Glycol (SYSTANE OP) Place 1 drop into both eyes daily at 2 am.     Probiotic Product (PROBIOTIC COMPLEX ACIDOPHILUS) CAPS Take 1 tablet by mouth daily. "Enzyme Probiotic Complex"     propafenone (RYTHMOL) 150 MG tablet Take 1 tablet (150 mg total) by mouth every 8 (eight) hours. 270 tablet 3   Turmeric 500 MG CAPS Take 500 mg by mouth at bedtime.     vitamin B-12 (CYANOCOBALAMIN) 1000 MCG tablet Take 1,000 mcg by mouth daily.     diltiazem (CARDIZEM) 30 MG tablet TAKE 1 TABLET DAILY AS NEEDED. FOR HEART RATE>100. (Patient not taking: Reported on 05/18/2023) 90 tablet 3   nitroGLYCERIN (NITROSTAT) 0.4 MG SL tablet Place 1 tablet (0.4 mg total) under the tongue every 5 (five) minutes x 3 doses as needed for chest pain. (Patient not taking: Reported on 05/18/2023) 25 tablet 3   No current facility-administered medications for this visit.    Allergies  Allergen Reactions   Augmentin [Amoxicillin-Pot Clavulanate] Hives    Ciprofloxacin Palpitations and Other (See Comments)    "irregular, rapid heart beat"   Diltiazem Other (See Comments)    Pulse dropped too low   Epinephrine Other (See Comments)    "NOVOCAIN"--could be the epinephrine----patient didn't tolerate-SYNCOPE     Novocain [Procaine] Other (See Comments)    could be the epinephrine----patient didn't tolerate-SYNCOPE   Avapro [Irbesartan] Swelling   Shrimp [Shellfish Allergy] Hives   Iohexol Itching and Rash     Code: RASH, Desc: patient states hx of possible reaction to ivp contrast years ago    Macrobid [Nitrofurantoin] Other (See Comments)    Burning skin   Penicillins Hives    Has patient had a PCN reaction causing immediate rash, facial/tongue/throat swelling, SOB or lightheadedness with hypotension: Yes Has patient had a PCN reaction causing severe rash involving mucus membranes or skin necrosis: No Has patient had a PCN reaction that required hospitalization No Has patient had a PCN reaction occurring within the last 10 years: No If all of the above answers are "NO", then may proceed with Cephalosporin use.   Zithromax [Azithromycin] Other (See Comments)    Cannot take with propafenone      Review of Systems negative except from HPI and PMH  Physical Exam BP (!) 144/92   Pulse 72   Ht 5\' 6"  (1.676 m)   Wt 137 lb 12.8 oz (62.5 kg)   LMP 08/11/1988 (Approximate)   SpO2 99%   BMI 22.24 kg/m  Well developed and well nourished in no acute distress HENT normal; twitching Neck supple with JVP-flat Clear Device pocket well healed; without hematoma or erythema.  There is no tethering  Regular rate and rhythm, no  gallop No  murmur Abd-soft with active BS No Clubbing cyanosis  edema Skin-warm and dry A & Oriented  Grossly normal sensory and motor function  ECG afib 72 -/12/40  Device function is normal. Programming changes none  See Paceart for details    ECG Atrial pacing at 74 Intervals 24/11/39 RSR prime   Device  function is normal. Programming changes normla  See Paceart for details    Assessment and  Plan  Syncope  Atrial fibrillation/tachycardia  SCAF  Sinus bradycardia  Pacemaker   Medtronic  Hypertension  Hyperlipidemia  No interval syncope Interval atrial fibrillation now persistent.  Without early attributable symptoms. Because it could have been very incremental, we will increase her propafenone to 150 every 8 and then  undertake cardioversion (will have to be attentive to QRS duration widening) and then see if she can make a distinction between sinus and A-fib.  In the event that she cannot would not pursue sinus rhythm in the event that she can we will.  Lengthy discussion regarding the stresses related to the death of her son in November 18, 2022  Given the stresses, we will not make any blood pressure adjustments at this point  Pwp       Current medicines are reviewed at length with the patient today .  The patient does not  have concerns regarding medicines.

## 2023-05-19 ENCOUNTER — Ambulatory Visit (INDEPENDENT_AMBULATORY_CARE_PROVIDER_SITE_OTHER): Payer: Medicare Other

## 2023-05-19 DIAGNOSIS — I495 Sick sinus syndrome: Secondary | ICD-10-CM

## 2023-05-20 LAB — CUP PACEART REMOTE DEVICE CHECK
Battery Remaining Longevity: 67 mo
Battery Voltage: 2.95 V
Brady Statistic RA Percent Paced: 0.36 %
Brady Statistic RV Percent Paced: 45.99 %
Date Time Interrogation Session: 20241007235611
Implantable Lead Connection Status: 753985
Implantable Lead Connection Status: 753985
Implantable Lead Implant Date: 20180709
Implantable Lead Implant Date: 20180709
Implantable Lead Location: 753859
Implantable Lead Location: 753860
Implantable Lead Model: 3830
Implantable Lead Model: 5076
Implantable Pulse Generator Implant Date: 20180709
Lead Channel Impedance Value: 285 Ohm
Lead Channel Impedance Value: 342 Ohm
Lead Channel Impedance Value: 399 Ohm
Lead Channel Impedance Value: 494 Ohm
Lead Channel Pacing Threshold Amplitude: 0.75 V
Lead Channel Pacing Threshold Pulse Width: 0.4 ms
Lead Channel Sensing Intrinsic Amplitude: 0.375 mV
Lead Channel Sensing Intrinsic Amplitude: 0.75 mV
Lead Channel Sensing Intrinsic Amplitude: 10.75 mV
Lead Channel Sensing Intrinsic Amplitude: 13.5 mV
Lead Channel Setting Pacing Amplitude: 1.5 V
Lead Channel Setting Pacing Amplitude: 2.5 V
Lead Channel Setting Pacing Pulse Width: 1 ms
Lead Channel Setting Sensing Sensitivity: 2 mV
Zone Setting Status: 755011
Zone Setting Status: 755011

## 2023-05-25 DIAGNOSIS — F4323 Adjustment disorder with mixed anxiety and depressed mood: Secondary | ICD-10-CM | POA: Diagnosis not present

## 2023-05-28 ENCOUNTER — Ambulatory Visit: Payer: Medicare Other | Admitting: Nurse Practitioner

## 2023-05-30 ENCOUNTER — Other Ambulatory Visit: Payer: Self-pay | Admitting: Internal Medicine

## 2023-05-30 DIAGNOSIS — I495 Sick sinus syndrome: Secondary | ICD-10-CM

## 2023-06-01 ENCOUNTER — Telehealth: Payer: Self-pay

## 2023-06-01 NOTE — Telephone Encounter (Signed)
Patient called concerned about her watch saying her heart rate is in the 40's. Patient is currently asymptomatic and feels fine.   Explained to patient she is currently in AF and that causes the lower part of her heart  that causes the v-v interval timing off. Patient also is aware to check a manual pulse. Patient was a Engineer, civil (consulting). Advised patient if she were to have symptoms and see her rate stay in the 40's and not come back up to call and let us know.

## 2023-06-01 NOTE — Telephone Encounter (Signed)
Pt states her apple watch states her heart rate is in the 40's. I had her send a manual transmission with her home remote monitor. I let her speak with Leigh.

## 2023-06-02 DIAGNOSIS — F4323 Adjustment disorder with mixed anxiety and depressed mood: Secondary | ICD-10-CM | POA: Diagnosis not present

## 2023-06-03 NOTE — Telephone Encounter (Signed)
Tried to call pt but got VM

## 2023-06-08 DIAGNOSIS — F4323 Adjustment disorder with mixed anxiety and depressed mood: Secondary | ICD-10-CM | POA: Diagnosis not present

## 2023-06-08 NOTE — Progress Notes (Signed)
Remote pacemaker transmission.   

## 2023-06-10 ENCOUNTER — Telehealth: Payer: Self-pay

## 2023-06-10 DIAGNOSIS — F4323 Adjustment disorder with mixed anxiety and depressed mood: Secondary | ICD-10-CM | POA: Diagnosis not present

## 2023-06-10 NOTE — Telephone Encounter (Signed)
The pt called to cancel her Cardioversion. She would like a call back soon. I told her I could not promise her a timeframe from when she will get a phone call but Dr. Graciela Husbands nurse will give her a call by the end of the week.

## 2023-06-10 NOTE — Telephone Encounter (Signed)
Patient would like to cancel her cadioversion. Advised patient I will forward to triage to advise. Patient is fine to stay in AF she states.  Patient also wanted to express her thanks to Dr. Graciela Husbands and she is very appreciative of her visit with Dr. Graciela Husbands. She would also like to know when her next apt would be.

## 2023-06-11 NOTE — Telephone Encounter (Signed)
This has been addressed in an encounter dated 06/10/2023.  See that encounter for complete details.

## 2023-06-11 NOTE — Telephone Encounter (Signed)
Spoke with pt  who states after speaking with her son she has decided not to move forward with DCCV.  Pt states she is not aware she is in afib and at her age does not wish to proceed at this time.  Pt continues to take her Eliquis as prescribed. Pt advised RN has spoken with Dr Graciela Husbands and he is agreeable to pt canceling cardioversion.  Rn will contact Endo to cancel procedure.  Pt will be contacted regarding follow up.  She verbalizes understanding and thanked Charity fundraiser for the call.

## 2023-06-15 DIAGNOSIS — F4323 Adjustment disorder with mixed anxiety and depressed mood: Secondary | ICD-10-CM | POA: Diagnosis not present

## 2023-06-17 ENCOUNTER — Encounter: Payer: Medicare Other | Admitting: Internal Medicine

## 2023-06-17 ENCOUNTER — Ambulatory Visit (HOSPITAL_COMMUNITY): Admission: RE | Admit: 2023-06-17 | Payer: Medicare Other | Source: Home / Self Care | Admitting: Cardiology

## 2023-06-17 ENCOUNTER — Encounter (HOSPITAL_COMMUNITY): Admission: RE | Payer: Self-pay | Source: Home / Self Care

## 2023-06-17 DIAGNOSIS — I4719 Other supraventricular tachycardia: Secondary | ICD-10-CM

## 2023-06-17 DIAGNOSIS — I480 Paroxysmal atrial fibrillation: Secondary | ICD-10-CM

## 2023-06-17 SURGERY — CARDIOVERSION
Anesthesia: General

## 2023-06-30 ENCOUNTER — Other Ambulatory Visit (HOSPITAL_BASED_OUTPATIENT_CLINIC_OR_DEPARTMENT_OTHER): Payer: Self-pay

## 2023-06-30 DIAGNOSIS — Z23 Encounter for immunization: Secondary | ICD-10-CM | POA: Diagnosis not present

## 2023-06-30 MED ORDER — FLUAD 0.5 ML IM SUSY
PREFILLED_SYRINGE | INTRAMUSCULAR | 0 refills | Status: AC
  Filled 2023-06-30: qty 0.5, 1d supply, fill #0

## 2023-07-20 DIAGNOSIS — F4323 Adjustment disorder with mixed anxiety and depressed mood: Secondary | ICD-10-CM | POA: Diagnosis not present

## 2023-07-27 DIAGNOSIS — F4323 Adjustment disorder with mixed anxiety and depressed mood: Secondary | ICD-10-CM | POA: Diagnosis not present

## 2023-08-03 DIAGNOSIS — F4323 Adjustment disorder with mixed anxiety and depressed mood: Secondary | ICD-10-CM | POA: Diagnosis not present

## 2023-08-17 DIAGNOSIS — F4323 Adjustment disorder with mixed anxiety and depressed mood: Secondary | ICD-10-CM | POA: Diagnosis not present

## 2023-08-18 ENCOUNTER — Ambulatory Visit (INDEPENDENT_AMBULATORY_CARE_PROVIDER_SITE_OTHER): Payer: Medicare Other

## 2023-08-18 DIAGNOSIS — I495 Sick sinus syndrome: Secondary | ICD-10-CM | POA: Diagnosis not present

## 2023-08-19 LAB — CUP PACEART REMOTE DEVICE CHECK
Battery Remaining Longevity: 59 mo
Battery Voltage: 2.96 V
Brady Statistic RA Percent Paced: 0.92 %
Brady Statistic RV Percent Paced: 67.51 %
Date Time Interrogation Session: 20250106234401
Implantable Lead Connection Status: 753985
Implantable Lead Connection Status: 753985
Implantable Lead Implant Date: 20180709
Implantable Lead Implant Date: 20180709
Implantable Lead Location: 753859
Implantable Lead Location: 753860
Implantable Lead Model: 3830
Implantable Lead Model: 5076
Implantable Pulse Generator Implant Date: 20180709
Lead Channel Impedance Value: 304 Ohm
Lead Channel Impedance Value: 323 Ohm
Lead Channel Impedance Value: 418 Ohm
Lead Channel Impedance Value: 475 Ohm
Lead Channel Pacing Threshold Amplitude: 0.75 V
Lead Channel Pacing Threshold Pulse Width: 0.4 ms
Lead Channel Sensing Intrinsic Amplitude: 0.375 mV
Lead Channel Sensing Intrinsic Amplitude: 0.375 mV
Lead Channel Sensing Intrinsic Amplitude: 10.5 mV
Lead Channel Sensing Intrinsic Amplitude: 10.5 mV
Lead Channel Setting Pacing Amplitude: 1.5 V
Lead Channel Setting Pacing Amplitude: 2.5 V
Lead Channel Setting Pacing Pulse Width: 1 ms
Lead Channel Setting Sensing Sensitivity: 2 mV
Zone Setting Status: 755011
Zone Setting Status: 755011

## 2023-08-24 DIAGNOSIS — F4323 Adjustment disorder with mixed anxiety and depressed mood: Secondary | ICD-10-CM | POA: Diagnosis not present

## 2023-08-26 ENCOUNTER — Telehealth: Payer: Self-pay | Admitting: Internal Medicine

## 2023-08-26 DIAGNOSIS — I495 Sick sinus syndrome: Secondary | ICD-10-CM

## 2023-08-26 MED ORDER — AMLODIPINE BESYLATE 2.5 MG PO TABS
2.5000 mg | ORAL_TABLET | Freq: Every day | ORAL | 2 refills | Status: DC
Start: 2023-08-26 — End: 2024-01-08

## 2023-08-26 NOTE — Telephone Encounter (Signed)
*  STAT* If patient is at the pharmacy, call can be transferred to refill team.   1. Which medications need to be refilled? (please list name of each medication and dose if known)   amLODipine  (NORVASC ) 2.5 MG tablet   2. Would you like to learn more about the convenience, safety, & potential cost savings by using the [facility]?   3. Are you open to using the [facility] (Type [facility]. ).  4. Which pharmacy/location (including street and city if local pharmacy) is medication to be sent to?  [facility] - [place], [place] - [address]   5. Do they need a 30 day or 90 day supply?   90 day   Patient stated she only has 2 days left of this medication.

## 2023-08-26 NOTE — Telephone Encounter (Signed)
 Pt's medication was sent to pt's pharmacy as requested. Confirmation received.

## 2023-08-31 DIAGNOSIS — F4323 Adjustment disorder with mixed anxiety and depressed mood: Secondary | ICD-10-CM | POA: Diagnosis not present

## 2023-09-07 DIAGNOSIS — F4323 Adjustment disorder with mixed anxiety and depressed mood: Secondary | ICD-10-CM | POA: Diagnosis not present

## 2023-09-09 ENCOUNTER — Encounter: Payer: Self-pay | Admitting: Internal Medicine

## 2023-09-14 DIAGNOSIS — F4323 Adjustment disorder with mixed anxiety and depressed mood: Secondary | ICD-10-CM | POA: Diagnosis not present

## 2023-10-01 NOTE — Progress Notes (Signed)
 Remote pacemaker transmission.

## 2023-10-01 NOTE — Addendum Note (Signed)
Addended by: Geralyn Flash D on: 10/01/2023 11:08 AM   Modules accepted: Orders

## 2023-10-16 DIAGNOSIS — R269 Unspecified abnormalities of gait and mobility: Secondary | ICD-10-CM | POA: Diagnosis not present

## 2023-10-16 DIAGNOSIS — M810 Age-related osteoporosis without current pathological fracture: Secondary | ICD-10-CM | POA: Diagnosis not present

## 2023-10-16 DIAGNOSIS — G5139 Clonic hemifacial spasm, unspecified: Secondary | ICD-10-CM | POA: Diagnosis not present

## 2023-10-16 DIAGNOSIS — Z950 Presence of cardiac pacemaker: Secondary | ICD-10-CM | POA: Diagnosis not present

## 2023-10-16 DIAGNOSIS — E7800 Pure hypercholesterolemia, unspecified: Secondary | ICD-10-CM | POA: Diagnosis not present

## 2023-10-16 DIAGNOSIS — Z23 Encounter for immunization: Secondary | ICD-10-CM | POA: Diagnosis not present

## 2023-10-16 DIAGNOSIS — F325 Major depressive disorder, single episode, in full remission: Secondary | ICD-10-CM | POA: Diagnosis not present

## 2023-10-16 DIAGNOSIS — Z1331 Encounter for screening for depression: Secondary | ICD-10-CM | POA: Diagnosis not present

## 2023-10-16 DIAGNOSIS — I10 Essential (primary) hypertension: Secondary | ICD-10-CM | POA: Diagnosis not present

## 2023-10-16 DIAGNOSIS — Z0000 Encounter for general adult medical examination without abnormal findings: Secondary | ICD-10-CM | POA: Diagnosis not present

## 2023-10-16 DIAGNOSIS — I480 Paroxysmal atrial fibrillation: Secondary | ICD-10-CM | POA: Diagnosis not present

## 2023-10-16 DIAGNOSIS — F411 Generalized anxiety disorder: Secondary | ICD-10-CM | POA: Diagnosis not present

## 2023-10-16 DIAGNOSIS — Z79899 Other long term (current) drug therapy: Secondary | ICD-10-CM | POA: Diagnosis not present

## 2023-10-16 DIAGNOSIS — R2689 Other abnormalities of gait and mobility: Secondary | ICD-10-CM | POA: Diagnosis not present

## 2023-10-16 DIAGNOSIS — R350 Frequency of micturition: Secondary | ICD-10-CM | POA: Diagnosis not present

## 2023-10-20 DIAGNOSIS — I10 Essential (primary) hypertension: Secondary | ICD-10-CM | POA: Diagnosis not present

## 2023-10-20 DIAGNOSIS — I480 Paroxysmal atrial fibrillation: Secondary | ICD-10-CM | POA: Diagnosis not present

## 2023-10-22 DIAGNOSIS — D582 Other hemoglobinopathies: Secondary | ICD-10-CM | POA: Diagnosis not present

## 2023-10-22 DIAGNOSIS — F4321 Adjustment disorder with depressed mood: Secondary | ICD-10-CM | POA: Diagnosis not present

## 2023-10-27 ENCOUNTER — Telehealth: Payer: Self-pay | Admitting: Internal Medicine

## 2023-10-27 NOTE — Telephone Encounter (Signed)
 Pt c/o medication issue:  1. Name of Medication: Metoprolol and Amlodipine  2. How are you currently taking this medication (dosage and times per day)?   3. Are you having a reaction (difficulty breathing--STAT)?   4. What is your medication issue? Patient says she have questions about  these medicine

## 2023-10-27 NOTE — Telephone Encounter (Signed)
 Returned call to patient,   Patient states she has questions about a couple medications. She states she is confused her medications. She wanted to confirm that she should be taking both. She states her blood pressure was low yesterday so she only took half of her metoprolol. Advised patient if BP is consistently staying below 110 to let us know.

## 2023-11-05 ENCOUNTER — Other Ambulatory Visit: Payer: Self-pay | Admitting: Internal Medicine

## 2023-11-05 DIAGNOSIS — I480 Paroxysmal atrial fibrillation: Secondary | ICD-10-CM

## 2023-11-05 NOTE — Telephone Encounter (Signed)
 Prescription refill request for Eliquis received. Indication: afib  Last office visit: Katherine Cardenas 05/18/2023 Scr: 0.80, 03/22/2023 Age: 83 yo  Weight: 62.5 kg    Per dosing criteria pt qualifies for a dose change.

## 2023-11-06 MED ORDER — APIXABAN 2.5 MG PO TABS: 2.5000 mg | ORAL_TABLET | Freq: Two times a day (BID) | ORAL | 1 refills | Status: DC

## 2023-11-06 NOTE — Telephone Encounter (Signed)
 Pt last saw Dr Graciela Husbands 05/18/23, last labs 03/22/23 Creat 0.80, age 83, weight 62.5kg, based on specified criteria pt is not on appropriate dosage of Eliquis 2.5mg  BID for afib. Age greater than 80 and weight greater than 60kg, pt should be on Eliquis 5mg  BID for afib.  Will route to Pharmacist to see if dosage change is appropriate.

## 2023-11-06 NOTE — Telephone Encounter (Signed)
 Katherine Cardenas, [facility] to Me Regarding result: ELIQUIS 2.5 MG TABS tablet [Pharmacy Med Name: Eliquis 2.5 mg tablet]     11/06/23  9:24 AM  Yes, please increase to 5mg  BID  Called spoke with pt, pt does not wish to increase her Eliquis dosage at this time.  Explained importance of being on the correct dosage of Eliquis for clot and stroke protection, pt verbalized understanding, but states mentally and emotionally she cannot at this time take on additional risks of being on increased dosage of Eliquis.  Pt states her weight on her home scales is 131 lbs (59.54 kg) therefor if this weight is accurate 2.5mg  BID is appropriate dosage given weight less than 60kg, and age greater than 80.  Will refill Eliquis 2.5mg  BID.

## 2023-11-09 DIAGNOSIS — I10 Essential (primary) hypertension: Secondary | ICD-10-CM | POA: Diagnosis not present

## 2023-11-09 DIAGNOSIS — E7800 Pure hypercholesterolemia, unspecified: Secondary | ICD-10-CM | POA: Diagnosis not present

## 2023-11-09 DIAGNOSIS — M810 Age-related osteoporosis without current pathological fracture: Secondary | ICD-10-CM | POA: Diagnosis not present

## 2023-11-09 DIAGNOSIS — F4321 Adjustment disorder with depressed mood: Secondary | ICD-10-CM | POA: Diagnosis not present

## 2023-11-09 DIAGNOSIS — I480 Paroxysmal atrial fibrillation: Secondary | ICD-10-CM | POA: Diagnosis not present

## 2023-11-17 ENCOUNTER — Ambulatory Visit (INDEPENDENT_AMBULATORY_CARE_PROVIDER_SITE_OTHER): Payer: Medicare Other

## 2023-11-17 DIAGNOSIS — I495 Sick sinus syndrome: Secondary | ICD-10-CM

## 2023-11-18 DIAGNOSIS — I10 Essential (primary) hypertension: Secondary | ICD-10-CM | POA: Diagnosis not present

## 2023-11-18 DIAGNOSIS — I480 Paroxysmal atrial fibrillation: Secondary | ICD-10-CM | POA: Diagnosis not present

## 2023-11-18 LAB — CUP PACEART REMOTE DEVICE CHECK
Battery Remaining Longevity: 54 mo
Battery Voltage: 2.95 V
Brady Statistic RA Percent Paced: 1.13 %
Brady Statistic RV Percent Paced: 71.06 %
Date Time Interrogation Session: 20250408000044
Implantable Lead Connection Status: 753985
Implantable Lead Connection Status: 753985
Implantable Lead Implant Date: 20180709
Implantable Lead Implant Date: 20180709
Implantable Lead Location: 753859
Implantable Lead Location: 753860
Implantable Lead Model: 3830
Implantable Lead Model: 5076
Implantable Pulse Generator Implant Date: 20180709
Lead Channel Impedance Value: 304 Ohm
Lead Channel Impedance Value: 323 Ohm
Lead Channel Impedance Value: 399 Ohm
Lead Channel Impedance Value: 494 Ohm
Lead Channel Pacing Threshold Amplitude: 0.75 V
Lead Channel Pacing Threshold Pulse Width: 0.4 ms
Lead Channel Sensing Intrinsic Amplitude: 0.625 mV
Lead Channel Sensing Intrinsic Amplitude: 0.625 mV
Lead Channel Sensing Intrinsic Amplitude: 10.625 mV
Lead Channel Sensing Intrinsic Amplitude: 10.625 mV
Lead Channel Setting Pacing Amplitude: 1.5 V
Lead Channel Setting Pacing Amplitude: 2.5 V
Lead Channel Setting Pacing Pulse Width: 1 ms
Lead Channel Setting Sensing Sensitivity: 2 mV
Zone Setting Status: 755011
Zone Setting Status: 755011

## 2023-11-23 DIAGNOSIS — F4321 Adjustment disorder with depressed mood: Secondary | ICD-10-CM | POA: Diagnosis not present

## 2023-11-28 ENCOUNTER — Encounter: Payer: Self-pay | Admitting: Internal Medicine

## 2023-11-30 ENCOUNTER — Encounter: Payer: Self-pay | Admitting: Internal Medicine

## 2023-11-30 ENCOUNTER — Ambulatory Visit: Payer: Medicare Other | Attending: Internal Medicine | Admitting: Internal Medicine

## 2023-11-30 VITALS — BP 142/78 | HR 63 | Ht 66.0 in | Wt 135.0 lb

## 2023-11-30 DIAGNOSIS — Z950 Presence of cardiac pacemaker: Secondary | ICD-10-CM | POA: Diagnosis not present

## 2023-11-30 DIAGNOSIS — I480 Paroxysmal atrial fibrillation: Secondary | ICD-10-CM | POA: Diagnosis not present

## 2023-11-30 DIAGNOSIS — I495 Sick sinus syndrome: Secondary | ICD-10-CM | POA: Insufficient documentation

## 2023-11-30 DIAGNOSIS — I4719 Other supraventricular tachycardia: Secondary | ICD-10-CM | POA: Insufficient documentation

## 2023-11-30 MED ORDER — METOPROLOL TARTRATE 25 MG PO TABS
25.0000 mg | ORAL_TABLET | Freq: Two times a day (BID) | ORAL | 3 refills | Status: AC
Start: 2023-11-30 — End: ?

## 2023-11-30 NOTE — Patient Instructions (Addendum)
 dMedication Instructions:  Your physician has recommended you make the following change in your medication:   ** Stop Metoprolol  Succinate  ** Stop Proprafenone  ** Begin Metoprolol  Tartrate - 1 tablet by mouth twice daily  *If you need a refill on your cardiac medications before your next appointment, please call your pharmacy*  Lab Work: None ordered.  If you have labs (blood work) drawn today and your tests are completely normal, you will receive your results only by: MyChart Message (if you have MyChart) OR A paper copy in the mail If you have any lab test that is abnormal or we need to change your treatment, we will call you to review the results.  Testing/Procedures: None ordered.   Follow-Up: At [facility], you and your health needs are our priority.  As part of our continuing mission to provide you with exceptional heart care, our providers are all part of one team.  This team includes your primary Cardiologist (physician) and Advanced Practice Providers or APPs (Physician Assistants and Nurse Practitioners) who all work together to provide you with the care you need, when you need it.  Your next appointment:   6 months      1st Floor: - Lobby - Registration  - Pharmacy  - Lab - Cafe  2nd Floor: - PV Lab - Diagnostic Testing (echo, CT, nuclear med)  3rd Floor: - Vacant  4th Floor: - TCTS (cardiothoracic surgery) - AFib Clinic - Structural Heart Clinic - Vascular Surgery  - Vascular Ultrasound  5th Floor: - HeartCare Cardiology (general and EP) - Clinical Pharmacy for coumadin, hypertension, lipid, weight-loss medications, and med management appointments    Valet parking services will be available as well.

## 2023-11-30 NOTE — Progress Notes (Signed)
 Patient Care Team: Katherine Feeling, MD as PCP - General (Internal Medicine) Katherine Goodwill, MD as PCP - Electrophysiology (Cardiology)   HPI  Katherine Cardenas is a 83 y.o. female Seen in follow-up for a history of atrial arrhythmia and sinus node dysfunction s/p Pacer Medtronic  7/18 having presented with syncope.  Now with persistent atrial fibrillation.  Patient declined 1/25 to undergo cardioversion.  And she is now in permanent atrial fibrillation Has had low blood pressures into the 80s, brings in a list of blood pressure recordings from home in the 110s    The patient denies chest pain, shortness of breath, nocturnal dyspnea, orthopnea or peripheral edema.  There have been no palpitations, lightheadedness or syncope.Katherine Cardenas   DATE TEST EF   5/16 LHC  No obstructive CAD  4/18 Echo  60-65 % Mild LVH              DATE PR interval QRSduration Propafenone   5/18  140 92 0  3/19 160 96 150  10/19 Ap 148 96 150 (2x)   5/22 Ap 200 106 150 (2.5X)  7/24      Date Cr K Hgb TC LDL  7/18    194 105  1/24  0.8 4.5 15.0    8/24 0.8 4.5 15.6        Thromboembolic risk factors ( age  -2, HTN-1, Gender-1) for a CHADSVASc Score of 4   Past Medical History:  Diagnosis Date   Anxiety    Atrial fibrillation (HCC)    Atrial tachycardia (HCC)    Depression    Facial spasm    Hypertension    Tachy-brady syndrome (HCC)    a. s/p MDT His Bundle pacemaker implant 2018 - Dr Katherine Cardenas    Past Surgical History:  Procedure Laterality Date   APPENDECTOMY     age 23   CARDIAC CATHETERIZATION N/A 12/15/2014   Procedure: Left Heart Cath and Coronary Angiography;  Surgeon: Katherine Benne, MD;  Location: [facility] INVASIVE CV LAB;  Service: Cardiovascular;  Laterality: N/A;   CATARACT EXTRACTION     Right Eye   DILATION AND CURETTAGE OF UTERUS  1990's   benign polyp   PACEMAKER IMPLANT N/A 02/16/2017   Procedure: Pacemaker Implant;  Surgeon: Katherine Goodwill, MD;  Location: [facility] INVASIVE CV LAB;   Service: Cardiovascular;  Laterality: N/A;   TONSILLECTOMY AND ADENOIDECTOMY     --age 56    Current Outpatient Medications  Medication Sig Dispense Refill   amLODipine  (NORVASC ) 2.5 MG tablet Take 1 tablet (2.5 mg total) by mouth daily. 90 tablet 2   apixaban  (ELIQUIS ) 2.5 MG TABS tablet Take 1 tablet (2.5 mg total) by mouth 2 (two) times daily. 180 tablet 1   Ascorbic Acid  (VITAMIN C ) 1000 MG tablet Take 500 mg by mouth daily before supper.      cholecalciferol  (VITAMIN D ) 1000 UNITS tablet Take 1,000 Units by mouth at bedtime.      Coenzyme Q10 200 MG capsule Take 200 mg by mouth daily.     Cranberry 500 MG TABS Take 500 mg by mouth as needed.     influenza vaccine adjuvanted (FLUAD) 0.5 ML injection Inject into the muscle. 0.5 mL 0   LORazepam  (ATIVAN ) 0.5 MG tablet Take 0.25 mg by mouth daily.     Magnesium  400 MG CAPS Take 400 mg by mouth daily.     metoprolol  succinate (TOPROL  XL) 25 MG 24 hr tablet Take 1  tablet (25 mg total) by mouth daily. (Patient taking differently: Take 12.5 mg by mouth daily.) 90 tablet 3   diltiazem  (CARDIZEM ) 30 MG tablet TAKE 1 TABLET DAILY AS NEEDED. FOR HEART RATE>100. (Patient not taking: Reported on 11/30/2023) 90 tablet 3   nitroGLYCERIN  (NITROSTAT ) 0.4 MG SL tablet Place 1 tablet (0.4 mg total) under the tongue every 5 (five) minutes x 3 doses as needed for chest pain. 25 tablet 3   Polyethyl Glycol-Propyl Glycol (SYSTANE OP) Place 1 drop into both eyes daily at 2 am.     Probiotic Product (PROBIOTIC COMPLEX ACIDOPHILUS) CAPS Take 1 tablet by mouth daily. "Enzyme Probiotic Complex"     propafenone  (RYTHMOL ) 150 MG tablet Take 1 tablet (150 mg total) by mouth every 8 (eight) hours. 270 tablet 3   Turmeric 500 MG CAPS Take 500 mg by mouth at bedtime.     vitamin B-12 (CYANOCOBALAMIN ) 1000 MCG tablet Take 1,000 mcg by mouth daily.     No current facility-administered medications for this visit.    Allergies  Allergen Reactions   Augmentin  [Amoxicillin-Pot Clavulanate] Hives   Ciprofloxacin Palpitations and Other (See Comments)    "irregular, rapid heart beat"   Diltiazem  Other (See Comments)    Pulse dropped too low   Epinephrine Other (See Comments)    "NOVOCAIN"--could be the epinephrine----patient didn't tolerate-SYNCOPE     Novocain [Procaine] Other (See Comments)    could be the epinephrine----patient didn't tolerate-SYNCOPE   Avapro [Irbesartan] Swelling   Shrimp [Shellfish Allergy] Hives   Iohexol  Itching and Rash     Code: RASH, Desc: patient states hx of possible reaction to ivp contrast years ago    Macrobid [Nitrofurantoin] Other (See Comments)    Burning skin   Penicillins Hives    Has patient had a PCN reaction causing immediate rash, facial/tongue/throat swelling, SOB or lightheadedness with hypotension: Yes Has patient had a PCN reaction causing severe rash involving mucus membranes or skin necrosis: No Has patient had a PCN reaction that required hospitalization No Has patient had a PCN reaction occurring within the last 10 years: No If all of the above answers are "NO", then may proceed with Cephalosporin use.   Zithromax [Azithromycin] Other (See Comments)    Cannot take with propafenone       Review of Systems negative except from HPI and PMH  Physical Exam LMP 08/11/1988 (Approximate)  BP (!) 142/78   Pulse 63   Ht 5\' 6"  (1.676 m)   Wt 135 lb (61.2 kg)   LMP 08/11/1988 (Approximate)   SpO2 97%   BMI 21.79 kg/m   Well developed and well nourished in no acute distress HENT normal Neck supple with JVP-flat Clear Device pocket well healed; without hematoma or erythema.  There is no tethering  Regular rate and rhythm, no  gallop No  murmur Abd-soft with active BS No Clubbing cyanosis tr edema Skin-warm and dry A & Oriented  Grossly normal sensory and motor function  ECG atrial fib @ 68 -/10/42  Device function is normal. Programming changes programmed DDD--VVIR see Paceart for  details     Assessment and  Plan  Syncope  Atrial fibrillation/-permanent  Pacemaker   Medtronic  Hypertension  Hyperlipidemia    No interval syncope.  Atrial fibrillation is now permanent.  She made a decision in discussions with her family not to pursue cardioversion.  Hence, we will discontinue propafenone .  There is an interesting very close correlation between activity which decreased by about 50%  at the same time her atrial fibrillation became permanent.  She thinks that this is at the same time however where her children told her to stop driving as she became much more homebound and much less active secondary to that.  She reports having blood pressures in the 80s.  She is reluctant to increase her metoprolol  succinate.  We will discontinue it and put her on metoprolol  tartrate 25 twice daily.  This may be sufficient to control her blood pressure given that the most recent ones are in the 110s.   continue amlodipine  to 2.5  Continue anticoagulation      Current medicines are reviewed at length with the patient today .  The patient does not  have concerns regarding medicines.

## 2023-12-04 LAB — CUP PACEART INCLINIC DEVICE CHECK
Date Time Interrogation Session: 20250421150000
Implantable Lead Connection Status: 753985
Implantable Lead Connection Status: 753985
Implantable Lead Implant Date: 20180709
Implantable Lead Implant Date: 20180709
Implantable Lead Location: 753859
Implantable Lead Location: 753860
Implantable Lead Model: 3830
Implantable Lead Model: 5076
Implantable Pulse Generator Implant Date: 20180709

## 2023-12-09 DIAGNOSIS — I10 Essential (primary) hypertension: Secondary | ICD-10-CM | POA: Diagnosis not present

## 2023-12-09 DIAGNOSIS — E7800 Pure hypercholesterolemia, unspecified: Secondary | ICD-10-CM | POA: Diagnosis not present

## 2023-12-09 DIAGNOSIS — M810 Age-related osteoporosis without current pathological fracture: Secondary | ICD-10-CM | POA: Diagnosis not present

## 2023-12-09 DIAGNOSIS — I480 Paroxysmal atrial fibrillation: Secondary | ICD-10-CM | POA: Diagnosis not present

## 2023-12-18 DIAGNOSIS — I480 Paroxysmal atrial fibrillation: Secondary | ICD-10-CM | POA: Diagnosis not present

## 2023-12-18 DIAGNOSIS — I10 Essential (primary) hypertension: Secondary | ICD-10-CM | POA: Diagnosis not present

## 2023-12-21 DIAGNOSIS — F4321 Adjustment disorder with depressed mood: Secondary | ICD-10-CM | POA: Diagnosis not present

## 2024-01-05 NOTE — Addendum Note (Signed)
 Addended by: Lott Rouleau A on: 01/05/2024 12:46 PM   Modules accepted: Orders

## 2024-01-05 NOTE — Progress Notes (Signed)
 Remote pacemaker transmission.

## 2024-01-08 ENCOUNTER — Telehealth: Payer: Self-pay | Admitting: Internal Medicine

## 2024-01-08 DIAGNOSIS — I495 Sick sinus syndrome: Secondary | ICD-10-CM

## 2024-01-08 MED ORDER — AMLODIPINE BESYLATE 2.5 MG PO TABS: 2.5000 mg | ORAL_TABLET | Freq: Every day | ORAL | 3 refills | Status: DC

## 2024-01-08 NOTE — Telephone Encounter (Signed)
*  STAT* If patient is at the pharmacy, call can be transferred to refill team.   1. Which medications need to be refilled? (please list name of each medication and dose if known) amLODipine  (NORVASC ) 2.5 MG tablet    2. Would you like to learn more about the convenience, safety, & potential cost savings by using the [facility]?   3. Are you open to using the [facility] (Type [facility].  ).   4. Which pharmacy/location (including street and city if local pharmacy) is medication to be sent to? [facility] - [place], [place] - [address]    5. Do they need a 30 day or 90 day supply? 90 day

## 2024-01-08 NOTE — Telephone Encounter (Signed)
 Pt's medication was sent to pt's pharmacy as requested. Confirmation received.

## 2024-01-09 DIAGNOSIS — I10 Essential (primary) hypertension: Secondary | ICD-10-CM | POA: Diagnosis not present

## 2024-01-09 DIAGNOSIS — I480 Paroxysmal atrial fibrillation: Secondary | ICD-10-CM | POA: Diagnosis not present

## 2024-01-09 DIAGNOSIS — E7800 Pure hypercholesterolemia, unspecified: Secondary | ICD-10-CM | POA: Diagnosis not present

## 2024-01-09 DIAGNOSIS — M810 Age-related osteoporosis without current pathological fracture: Secondary | ICD-10-CM | POA: Diagnosis not present

## 2024-01-11 DIAGNOSIS — F4321 Adjustment disorder with depressed mood: Secondary | ICD-10-CM | POA: Diagnosis not present

## 2024-01-15 ENCOUNTER — Telehealth: Payer: Self-pay | Admitting: Internal Medicine

## 2024-01-15 DIAGNOSIS — I495 Sick sinus syndrome: Secondary | ICD-10-CM

## 2024-01-15 MED ORDER — AMLODIPINE BESYLATE 2.5 MG PO TABS: 2.5000 mg | ORAL_TABLET | Freq: Two times a day (BID) | ORAL | 2 refills | Status: AC

## 2024-01-15 NOTE — Telephone Encounter (Signed)
 Called and spoke to pt; recommendations given. Pt is insisting on having RX of Amlodipine  as 2.5 mg BID, even though both Dr. Audery Blazing and Chris Pavero are recommending this medication be taken as 5 mg once daily. After reviewing with Dr. Audery Blazing, it is okay for us  to send in as 2.5 mg BID, but I did explain to the pt that this medication is meant to be taken once daily. Pt very appreciative as she explains that it will be easier for her to go back to the 2.5 mg once daily if needed in the future. She feels that her BP may be elevated at the moment due to stress, but states her sister will be coming up from [facility] to visit soon and she will help her. She will monitor her BP's for next 2-3 weeks and get back in touch with us  sooner if needed.

## 2024-01-15 NOTE — Telephone Encounter (Signed)
 Returned pt's phone call regarding her recent BP's. She began taking Amlodipine  2.5 mg once daily in April 2025 and now would like to know if she can take it (two times daily (2.5 mg at 10:00 am and again 2.5 mg at 10:00 pm). She has noticed that her BP is trending up, her early morning readings. No symptoms; she feels fine, but has been under stress lately.   Early morning readings for last five days: 166/100, 133/87, 146/100, 159/90, 144/93.   During the day/after meds BP's for last five days: 122/73, 119/77, 149/90, 116/68, 144/93.

## 2024-01-15 NOTE — Telephone Encounter (Signed)
 Pt c/o BP issue: STAT if pt c/o blurred vision, one-sided weakness or slurred speech.  STAT if BP is GREATER than 180/120 TODAY.  STAT if BP is LESS than 90/60 and SYMPTOMATIC TODAY  1. What is your BP concern?   Patient wants a medication change for her medication at bedtime  2. Have you taken any BP medication today?  Yes  3. What are your last 5 BP readings?  143/88 this morning 119/67 - yesterday around lunch time  4. Are you having any other symptoms (ex. Dizziness, headache, blurred vision, passed out)?  No  Patient stated her BP readings in the mornings are trending high and she thinks she needs a medication change for her medication to last overnight.  Patient noted she takes her BP medication around 10:00 am.

## 2024-01-17 DIAGNOSIS — I10 Essential (primary) hypertension: Secondary | ICD-10-CM | POA: Diagnosis not present

## 2024-01-17 DIAGNOSIS — I480 Paroxysmal atrial fibrillation: Secondary | ICD-10-CM | POA: Diagnosis not present

## 2024-02-02 DIAGNOSIS — F4321 Adjustment disorder with depressed mood: Secondary | ICD-10-CM | POA: Diagnosis not present

## 2024-02-11 ENCOUNTER — Ambulatory Visit: Payer: Self-pay

## 2024-02-11 ENCOUNTER — Other Ambulatory Visit: Payer: Self-pay

## 2024-02-11 ENCOUNTER — Emergency Department (HOSPITAL_BASED_OUTPATIENT_CLINIC_OR_DEPARTMENT_OTHER)
Admission: EM | Admit: 2024-02-11 | Discharge: 2024-02-11 | Disposition: A | Discharge status: Home / Self Care | Attending: Emergency Medicine | Admitting: Emergency Medicine

## 2024-02-11 ENCOUNTER — Encounter (HOSPITAL_BASED_OUTPATIENT_CLINIC_OR_DEPARTMENT_OTHER): Payer: Self-pay

## 2024-02-11 DIAGNOSIS — Z79899 Other long term (current) drug therapy: Secondary | ICD-10-CM | POA: Diagnosis not present

## 2024-02-11 DIAGNOSIS — Z7901 Long term (current) use of anticoagulants: Secondary | ICD-10-CM | POA: Diagnosis not present

## 2024-02-11 DIAGNOSIS — I10 Essential (primary) hypertension: Secondary | ICD-10-CM | POA: Insufficient documentation

## 2024-02-11 NOTE — ED Notes (Signed)
 Pt d/c instructions, medications, and follow-up care reviewed with pt and pt's daughter. Both verbalized understanding and had no further questions at time of d/c. Pt CA&Ox4, ambulatory, and in NAD at time of d/c

## 2024-02-11 NOTE — ED Provider Notes (Signed)
 Katherine Cardenas Provider Note   CSN: [id number] Arrival date & time: 02/11/24  1404     Patient presents with: Hypertension   Katherine Cardenas is a 83 y.o. female.   HPI    83 year old female comes to the ER with chief complaint of elevated blood pressure.  Patient has history of hypertension, A-fib on Eliquis .  Patient accompanied by daughter.  According the patient, normally her blood pressure has been running low.  She is enrolled in the study, where her BP is checked twice by machine and the blood pressure is transmitted to the clinic.  Although the study is completed, the blood pressures are still sent to PCP.  Normally she is told that the BPs are always fine.  However in the last few days, she has noted that her BP is actually running high.   When I went into the room, patient indicated that she discovered that she should be on amlodipine  which she is not taking right now.  Patient admits to having some memory issues.  Patient denies any chest pain, shortness of breath, headache, any acute neurologic symptoms such as one-sided weakness, numbness, slurred speech, vision change, balance issues.    Prior to Admission medications   Medication Sig Start Date End Date Taking? Authorizing Provider  amLODipine  (NORVASC ) 2.5 MG tablet Take 1 tablet (2.5 mg total) by mouth in the morning and at bedtime. 01/15/24   Katherine Redell RAMAN, MD  apixaban  (ELIQUIS ) 2.5 MG TABS tablet Take 1 tablet (2.5 mg total) by mouth 2 (two) times daily. 11/06/23   Katherine Elspeth BROCKS, MD  Ascorbic Acid  (VITAMIN C ) 1000 MG tablet Take 500 mg by mouth daily before supper.     [provider]  cholecalciferol  (VITAMIN D ) 1000 UNITS tablet Take 1,000 Units by mouth at bedtime.     [provider]  Coenzyme Q10 200 MG capsule Take 200 mg by mouth daily.    [provider]  Cranberry 500 MG TABS Take 500 mg by mouth as needed.    [provider]   diltiazem  (CARDIZEM ) 30 MG tablet TAKE 1 TABLET DAILY AS NEEDED. FOR HEART RATE>100. Patient not taking: Reported on 11/30/2023 10/17/20   Katherine Elspeth BROCKS, MD  influenza vaccine adjuvanted (FLUAD ) 0.5 ML injection Inject into the muscle. 06/30/23   Katherine Channel, MD  LORazepam  (ATIVAN ) 0.5 MG tablet Take 0.25 mg by mouth daily.    [provider]  Magnesium  400 MG CAPS Take 400 mg by mouth daily.    [provider]  metoprolol  tartrate (LOPRESSOR ) 25 MG tablet Take 1 tablet (25 mg total) by mouth 2 (two) times daily. 11/30/23   Katherine Elspeth BROCKS, MD  nitroGLYCERIN  (NITROSTAT ) 0.4 MG SL tablet Place 1 tablet (0.4 mg total) under the tongue every 5 (five) minutes x 3 doses as needed for chest pain. 05/25/18   Katherine Elspeth BROCKS, MD  Polyethyl Glycol-Propyl Glycol (SYSTANE OP) Place 1 drop into both eyes daily at 2 am.    [provider]  Probiotic Product (PROBIOTIC COMPLEX ACIDOPHILUS) CAPS Take 1 tablet by mouth daily. Enzyme Probiotic Complex    [provider]  Turmeric 500 MG CAPS Take 500 mg by mouth at bedtime.    [provider]  vitamin B-12 (CYANOCOBALAMIN ) 1000 MCG tablet Take 1,000 mcg by mouth daily.    [provider]    Allergies: Augmentin [amoxicillin-pot clavulanate], Ciprofloxacin, Diltiazem , Epinephrine, Novocain [procaine], Avapro [irbesartan], Shrimp [shellfish allergy],  Iohexol , Macrobid [nitrofurantoin], Penicillins, and Zithromax [azithromycin]    Review of Systems  All other systems reviewed and are negative.   Updated Vital Signs BP (!) 154/93   Pulse 70   Temp 97.6 F (36.4 C) (Oral)   Resp 18   Ht 5' 6.5 (1.689 m)   Wt 58.1 kg   LMP 08/11/1988 (Approximate)   SpO2 98%   BMI 20.35 kg/m   Physical Exam Vitals and nursing note reviewed.  Constitutional:      Appearance: She is well-developed.  HENT:     Head: Atraumatic.  Cardiovascular:     Rate and Rhythm: Normal rate.  Pulmonary:     Effort:  Pulmonary effort is normal.  Musculoskeletal:     Cervical back: Normal range of motion and neck supple.  Skin:    General: Skin is warm and dry.  Neurological:     Mental Status: She is alert and oriented to person, place, and time.     (all labs ordered are listed, but only abnormal results are displayed) Labs Reviewed - No data to display  EKG: None  Radiology: No results found.   Procedures   Medications Ordered in the ED - No data to display                                  Medical Decision Making  83 year old patient comes in with chief complaint of elevated blood pressure.  She states that her BP normally runs low, over the last few days it has been running slightly high.  Today the blood pressure was over 200, which got her nervous and she decided to come to the ER.  Patient does not have any symptoms associated with the elevated blood pressure.  While in the ER, the BP has come down to 170s, then 140s without any intervention.  Collateral history provided by patient's daughter who is at the bedside.  I have also reviewed patient's records including cardiology notes PCP notes.  It appears per April cardiology note that patient was complaining more of low blood pressure.  She was also not tolerating Toprol -XL, so she was put on metoprolol  twice daily.  It appears that after that, there were calls from patient to cardiologist about BP issues in June.  Patient was instructed to go up on the amlodipine  to 5 mg daily.  However patient wanted to be on 2.5 mg twice daily.  It occurs to the patient right now that she is not taking any amlodipine .  Although we considered hypertensive emergency, malignant hypertension in the differential diagnosis, given that patient is totally asymptomatic -I do not think any workup is indicated.  Given that the patient's blood pressure has improved spontaneously without any intervention, I do not think we need to make any adjustments to her  medications.  I advised patient to restart taking her amlodipine  for now.  Keep a log of blood pressure and follow-up with PCP.  Final diagnoses:  Essential hypertension    ED Discharge Orders     None          Charlyn Sora, MD 02/12/24 [phone]

## 2024-02-11 NOTE — Telephone Encounter (Signed)
   FYI Only or Action Required?: FYI only for provider.  Patient was last seen in primary care on N/A. Called Nurse Triage reporting Hypertension. Symptoms began today. Interventions attempted: Nothing. Symptoms are: rapidly worsening.  Triage Disposition: Go to ED Now (Notify PCP)  Patient/caregiver understands and will follow disposition?: Yes         Copied from CRM [id number]. Topic: Clinical - Red Word Triage >> Feb 11, 2024  1:32 PM Rosina BIRCH wrote: Red Word that prompted transfer to Nurse Triage: patient daughter called stating the patient blood pressure is 170/140 and she does not feel right Reason for Disposition  [1] Systolic BP  >= 160 OR Diastolic >= 100 AND [2] cardiac (e.g., breathing difficulty, chest pain) or neurologic symptoms (e.g., new-onset blurred or double vision, unsteady gait)  Answer Assessment - Initial Assessment Questions 1. BLOOD PRESSURE: What is the blood pressure? Did you take at least two measurements 5 minutes apart?     170/140 2. ONSET: When did you take your blood pressure?     --- 3. HOW: How did you take your blood pressure? (e.g., automatic home BP monitor, visiting nurse)     A cuff that is monitored and a home cuff 4. HISTORY: Do you have a history of high blood pressure?     Yes hypertension for about three years 5. MEDICINES: Are you taking any medicines for blood pressure? Have you missed any doses recently?     yes 6. OTHER SYMPTOMS: Do you have any symptoms? (e.g., blurred vision, chest pain, difficulty breathing, headache, weakness)     Patient states that a facial twitch that is existent feels like it may be worse today   Patient's daughter was driving on the way to the patient's home because the patient called and stated that her blood pressure was high and she didn't feel right  This RN stayed on the phone until the daughter reached the patient. Patient alert & oriented, denying any major cardiac or neurological  symptoms such as chest pain, difficulty breathing, numbness, weakness, difficulty speaking, headache, or weakness. Patient states she is just very worried and anxious about this because her blood pressure has never been this high. She has taken her medications appropriately.   Patient takes Eloquis a blood thinner Denies any injuries or falls lately History of Afib  196/119 at 1:45 PM  Given her medical history and current blood pressure ---it is advised that the patient goes to the Emergency Room immediately.  Daughter and patient are agreeable to this.   This RN offered an ambulance at this time for transport and they declined. Daughter stated that the patient and herself wanted to just go to the hospital via car at this time and they advised that they were only about ten minutes from the nearest ER. Patient's daughter is advised that if anything worsens or changes on the way that they can pull over and call 911 for an ambulance. Patient and daughter were walking out the door at this time to go to the Emergency Room.  Protocols used: Blood Pressure - High-A-AH

## 2024-02-11 NOTE — Discharge Instructions (Signed)
 Read the instructions provided on hypertension.  As discussed, continue taking your metoprolol  twice a day as prescribed. Your amlodipine  as prescribed currently as 2.5 milligram twice a day.    Continue to keep a log of your blood pressure and follow-up with your PCP if needed for further adjustment.  Return to the ER if you start having any severe chest pain, shortness of breath, severe headache, any neurologic symptoms such as one-sided weakness, numbness, slurred speech.

## 2024-02-11 NOTE — ED Triage Notes (Signed)
 Pt reports is part of BP study w/ monitoring, regularly takes BP throughout the night. Last night recorded BP, reports systolic in 200s consistently. Pt unsure if experiencing sx, reports 'feels horrible now bc I got no sleep last night'. Hx a-fib, pacemaker, on Eliquis . Reports compliant with Metoprolol ; reports has prescription for amlodipine  also but unsure if she's supposed to be taking it.

## 2024-02-11 NOTE — ED Notes (Signed)
 Reports urinary frequency and urgency 'past few days'. Reports significant hx of UTIs, suspects this may be related to her HTN.

## 2024-02-16 ENCOUNTER — Ambulatory Visit: Payer: Medicare Other

## 2024-02-16 DIAGNOSIS — I480 Paroxysmal atrial fibrillation: Secondary | ICD-10-CM | POA: Diagnosis not present

## 2024-02-16 DIAGNOSIS — I10 Essential (primary) hypertension: Secondary | ICD-10-CM | POA: Diagnosis not present

## 2024-02-16 DIAGNOSIS — I495 Sick sinus syndrome: Secondary | ICD-10-CM | POA: Diagnosis not present

## 2024-02-17 LAB — CUP PACEART REMOTE DEVICE CHECK
Battery Remaining Longevity: 48 mo
Battery Voltage: 2.95 V
Brady Statistic RA Percent Paced: 0 %
Brady Statistic RV Percent Paced: 57.75 %
Date Time Interrogation Session: 20250708082924
Implantable Lead Connection Status: 753985
Implantable Lead Connection Status: 753985
Implantable Lead Implant Date: 20180709
Implantable Lead Implant Date: 20180709
Implantable Lead Location: 753859
Implantable Lead Location: 753860
Implantable Lead Model: 3830
Implantable Lead Model: 5076
Implantable Pulse Generator Implant Date: 20180709
Lead Channel Impedance Value: 323 Ohm
Lead Channel Impedance Value: 323 Ohm
Lead Channel Impedance Value: 418 Ohm
Lead Channel Impedance Value: 494 Ohm
Lead Channel Pacing Threshold Amplitude: 0.75 V
Lead Channel Pacing Threshold Pulse Width: 0.4 ms
Lead Channel Sensing Intrinsic Amplitude: 0.5 mV
Lead Channel Sensing Intrinsic Amplitude: 0.5 mV
Lead Channel Sensing Intrinsic Amplitude: 7 mV
Lead Channel Sensing Intrinsic Amplitude: 7 mV
Lead Channel Setting Pacing Amplitude: 2.5 V
Lead Channel Setting Pacing Pulse Width: 1 ms
Lead Channel Setting Sensing Sensitivity: 2 mV
Zone Setting Status: 755011
Zone Setting Status: 755011

## 2024-02-23 DIAGNOSIS — F4321 Adjustment disorder with depressed mood: Secondary | ICD-10-CM | POA: Diagnosis not present

## 2024-02-25 ENCOUNTER — Ambulatory Visit: Payer: Self-pay | Admitting: Cardiology

## 2024-03-10 DIAGNOSIS — M810 Age-related osteoporosis without current pathological fracture: Secondary | ICD-10-CM | POA: Diagnosis not present

## 2024-03-10 DIAGNOSIS — E7800 Pure hypercholesterolemia, unspecified: Secondary | ICD-10-CM | POA: Diagnosis not present

## 2024-03-10 DIAGNOSIS — I480 Paroxysmal atrial fibrillation: Secondary | ICD-10-CM | POA: Diagnosis not present

## 2024-03-10 DIAGNOSIS — I10 Essential (primary) hypertension: Secondary | ICD-10-CM | POA: Diagnosis not present

## 2024-03-17 DIAGNOSIS — I480 Paroxysmal atrial fibrillation: Secondary | ICD-10-CM | POA: Diagnosis not present

## 2024-03-17 DIAGNOSIS — I10 Essential (primary) hypertension: Secondary | ICD-10-CM | POA: Diagnosis not present

## 2024-03-30 ENCOUNTER — Telehealth: Payer: Self-pay | Admitting: Cardiology

## 2024-03-30 NOTE — Telephone Encounter (Signed)
 Patient would like to know if PPM is compatible with [employer]. Please advise.

## 2024-03-30 NOTE — Telephone Encounter (Signed)
 This is a [employer] alert type system - there are usually no issues with these devices.  She has the necklace/lanyard that is long and several inches from location of her device.    Patient reports she has a special alert device with a high power transmitter according to the company she got it from.  As a result I gave her MDT's tech service line to call and verify that her transmitter is okay.  She thanks me for the information.

## 2024-04-05 DIAGNOSIS — F4321 Adjustment disorder with depressed mood: Secondary | ICD-10-CM | POA: Diagnosis not present

## 2024-04-10 DIAGNOSIS — M810 Age-related osteoporosis without current pathological fracture: Secondary | ICD-10-CM | POA: Diagnosis not present

## 2024-04-10 DIAGNOSIS — I480 Paroxysmal atrial fibrillation: Secondary | ICD-10-CM | POA: Diagnosis not present

## 2024-04-10 DIAGNOSIS — I10 Essential (primary) hypertension: Secondary | ICD-10-CM | POA: Diagnosis not present

## 2024-04-10 DIAGNOSIS — E7800 Pure hypercholesterolemia, unspecified: Secondary | ICD-10-CM | POA: Diagnosis not present

## 2024-04-16 DIAGNOSIS — I10 Essential (primary) hypertension: Secondary | ICD-10-CM | POA: Diagnosis not present

## 2024-04-16 DIAGNOSIS — I480 Paroxysmal atrial fibrillation: Secondary | ICD-10-CM | POA: Diagnosis not present

## 2024-04-21 DIAGNOSIS — R2689 Other abnormalities of gait and mobility: Secondary | ICD-10-CM | POA: Diagnosis not present

## 2024-04-21 DIAGNOSIS — R634 Abnormal weight loss: Secondary | ICD-10-CM | POA: Diagnosis not present

## 2024-04-21 DIAGNOSIS — Z79899 Other long term (current) drug therapy: Secondary | ICD-10-CM | POA: Diagnosis not present

## 2024-04-21 DIAGNOSIS — R413 Other amnesia: Secondary | ICD-10-CM | POA: Diagnosis not present

## 2024-04-21 DIAGNOSIS — R269 Unspecified abnormalities of gait and mobility: Secondary | ICD-10-CM | POA: Diagnosis not present

## 2024-04-21 DIAGNOSIS — F325 Major depressive disorder, single episode, in full remission: Secondary | ICD-10-CM | POA: Diagnosis not present

## 2024-04-21 DIAGNOSIS — I10 Essential (primary) hypertension: Secondary | ICD-10-CM | POA: Diagnosis not present

## 2024-04-21 DIAGNOSIS — F411 Generalized anxiety disorder: Secondary | ICD-10-CM | POA: Diagnosis not present

## 2024-04-21 DIAGNOSIS — R350 Frequency of micturition: Secondary | ICD-10-CM | POA: Diagnosis not present

## 2024-04-21 DIAGNOSIS — I480 Paroxysmal atrial fibrillation: Secondary | ICD-10-CM | POA: Diagnosis not present

## 2024-04-21 DIAGNOSIS — G5139 Clonic hemifacial spasm, unspecified: Secondary | ICD-10-CM | POA: Diagnosis not present

## 2024-04-28 ENCOUNTER — Other Ambulatory Visit: Payer: Self-pay | Admitting: Internal Medicine

## 2024-04-28 NOTE — Telephone Encounter (Signed)
 Prescription refill request for Eliquis  received. Indication:afib Last office visit:4/25 Scr:0.82  2025 Age: 83 Weight:58.1  kg  Prescription refilled

## 2024-05-02 DIAGNOSIS — F4321 Adjustment disorder with depressed mood: Secondary | ICD-10-CM | POA: Diagnosis not present

## 2024-05-10 DIAGNOSIS — E7800 Pure hypercholesterolemia, unspecified: Secondary | ICD-10-CM | POA: Diagnosis not present

## 2024-05-10 DIAGNOSIS — M810 Age-related osteoporosis without current pathological fracture: Secondary | ICD-10-CM | POA: Diagnosis not present

## 2024-05-10 DIAGNOSIS — I480 Paroxysmal atrial fibrillation: Secondary | ICD-10-CM | POA: Diagnosis not present

## 2024-05-10 DIAGNOSIS — I10 Essential (primary) hypertension: Secondary | ICD-10-CM | POA: Diagnosis not present

## 2024-05-16 DIAGNOSIS — I480 Paroxysmal atrial fibrillation: Secondary | ICD-10-CM | POA: Diagnosis not present

## 2024-05-16 DIAGNOSIS — I10 Essential (primary) hypertension: Secondary | ICD-10-CM | POA: Diagnosis not present

## 2024-05-17 ENCOUNTER — Ambulatory Visit: Payer: Medicare Other

## 2024-05-17 DIAGNOSIS — I495 Sick sinus syndrome: Secondary | ICD-10-CM

## 2024-05-19 LAB — CUP PACEART REMOTE DEVICE CHECK
Battery Remaining Longevity: 46 mo
Battery Voltage: 2.95 V
Brady Statistic RA Percent Paced: 0 %
Brady Statistic RV Percent Paced: 55.82 %
Date Time Interrogation Session: 20251006234237
Implantable Lead Connection Status: 753985
Implantable Lead Connection Status: 753985
Implantable Lead Implant Date: 20180709
Implantable Lead Implant Date: 20180709
Implantable Lead Location: 753859
Implantable Lead Location: 753860
Implantable Lead Model: 3830
Implantable Lead Model: 5076
Implantable Pulse Generator Implant Date: 20180709
Lead Channel Impedance Value: 285 Ohm
Lead Channel Impedance Value: 323 Ohm
Lead Channel Impedance Value: 380 Ohm
Lead Channel Impedance Value: 494 Ohm
Lead Channel Pacing Threshold Amplitude: 0.75 V
Lead Channel Pacing Threshold Pulse Width: 0.4 ms
Lead Channel Sensing Intrinsic Amplitude: 0.375 mV
Lead Channel Sensing Intrinsic Amplitude: 0.375 mV
Lead Channel Sensing Intrinsic Amplitude: 6.375 mV
Lead Channel Sensing Intrinsic Amplitude: 6.375 mV
Lead Channel Setting Pacing Amplitude: 2.5 V
Lead Channel Setting Pacing Pulse Width: 1 ms
Lead Channel Setting Sensing Sensitivity: 2 mV
Zone Setting Status: 755011
Zone Setting Status: 755011

## 2024-05-20 NOTE — Progress Notes (Signed)
 Remote PPM Transmission

## 2024-05-22 ENCOUNTER — Ambulatory Visit: Payer: Self-pay | Admitting: Cardiology

## 2024-06-08 DIAGNOSIS — F4321 Adjustment disorder with depressed mood: Secondary | ICD-10-CM | POA: Diagnosis not present

## 2024-06-10 DIAGNOSIS — F411 Generalized anxiety disorder: Secondary | ICD-10-CM | POA: Diagnosis not present

## 2024-06-10 DIAGNOSIS — M810 Age-related osteoporosis without current pathological fracture: Secondary | ICD-10-CM | POA: Diagnosis not present

## 2024-06-10 DIAGNOSIS — I10 Essential (primary) hypertension: Secondary | ICD-10-CM | POA: Diagnosis not present

## 2024-06-10 DIAGNOSIS — I480 Paroxysmal atrial fibrillation: Secondary | ICD-10-CM | POA: Diagnosis not present

## 2024-06-10 DIAGNOSIS — E7800 Pure hypercholesterolemia, unspecified: Secondary | ICD-10-CM | POA: Diagnosis not present

## 2024-06-15 DIAGNOSIS — I480 Paroxysmal atrial fibrillation: Secondary | ICD-10-CM | POA: Diagnosis not present

## 2024-06-15 DIAGNOSIS — I10 Essential (primary) hypertension: Secondary | ICD-10-CM | POA: Diagnosis not present

## 2024-06-28 DIAGNOSIS — R634 Abnormal weight loss: Secondary | ICD-10-CM | POA: Diagnosis not present

## 2024-06-28 DIAGNOSIS — G3184 Mild cognitive impairment, so stated: Secondary | ICD-10-CM | POA: Diagnosis not present

## 2024-06-28 DIAGNOSIS — B070 Plantar wart: Secondary | ICD-10-CM | POA: Diagnosis not present

## 2024-06-28 DIAGNOSIS — R4589 Other symptoms and signs involving emotional state: Secondary | ICD-10-CM | POA: Diagnosis not present

## 2024-07-10 DIAGNOSIS — M810 Age-related osteoporosis without current pathological fracture: Secondary | ICD-10-CM | POA: Diagnosis not present

## 2024-07-10 DIAGNOSIS — F411 Generalized anxiety disorder: Secondary | ICD-10-CM | POA: Diagnosis not present

## 2024-07-10 DIAGNOSIS — I10 Essential (primary) hypertension: Secondary | ICD-10-CM | POA: Diagnosis not present

## 2024-07-10 DIAGNOSIS — I480 Paroxysmal atrial fibrillation: Secondary | ICD-10-CM | POA: Diagnosis not present

## 2024-07-10 DIAGNOSIS — E7800 Pure hypercholesterolemia, unspecified: Secondary | ICD-10-CM | POA: Diagnosis not present

## 2024-08-16 ENCOUNTER — Ambulatory Visit: Payer: Medicare Other

## 2024-08-16 DIAGNOSIS — I495 Sick sinus syndrome: Secondary | ICD-10-CM | POA: Diagnosis not present

## 2024-08-18 ENCOUNTER — Ambulatory Visit: Payer: Self-pay | Admitting: Cardiology

## 2024-08-18 LAB — CUP PACEART REMOTE DEVICE CHECK
Battery Remaining Longevity: 49 mo
Battery Voltage: 2.95 V
Brady Statistic RA Percent Paced: 0 %
Brady Statistic RV Percent Paced: 32.24 %
Date Time Interrogation Session: 20260105222230
Implantable Lead Connection Status: 753985
Implantable Lead Connection Status: 753985
Implantable Lead Implant Date: 20180709
Implantable Lead Implant Date: 20180709
Implantable Lead Location: 753859
Implantable Lead Location: 753860
Implantable Lead Model: 3830
Implantable Lead Model: 5076
Implantable Pulse Generator Implant Date: 20180709
Lead Channel Impedance Value: 285 Ohm
Lead Channel Impedance Value: 304 Ohm
Lead Channel Impedance Value: 380 Ohm
Lead Channel Impedance Value: 456 Ohm
Lead Channel Pacing Threshold Amplitude: 0.75 V
Lead Channel Pacing Threshold Pulse Width: 0.4 ms
Lead Channel Sensing Intrinsic Amplitude: 0.375 mV
Lead Channel Sensing Intrinsic Amplitude: 0.375 mV
Lead Channel Sensing Intrinsic Amplitude: 6.5 mV
Lead Channel Sensing Intrinsic Amplitude: 6.5 mV
Lead Channel Setting Pacing Amplitude: 2.5 V
Lead Channel Setting Pacing Pulse Width: 1 ms
Lead Channel Setting Sensing Sensitivity: 2 mV
Zone Setting Status: 755011
Zone Setting Status: 755011

## 2024-08-18 NOTE — Progress Notes (Signed)
 Remote PPM Transmission
